# Patient Record
Sex: Male | Born: 1935
Health system: Southern US, Community
[De-identification: ages and names within clinical notes are randomized; demographics above are authoritative.]

## PROBLEM LIST (undated history)

## (undated) DIAGNOSIS — N183 Chronic kidney disease, stage 3 (moderate): Secondary | ICD-10-CM

## (undated) DIAGNOSIS — G454 Transient global amnesia: Secondary | ICD-10-CM

## (undated) DIAGNOSIS — E1129 Type 2 diabetes mellitus with other diabetic kidney complication: Secondary | ICD-10-CM

## (undated) DIAGNOSIS — I10 Essential (primary) hypertension: Secondary | ICD-10-CM

## (undated) DIAGNOSIS — I2510 Atherosclerotic heart disease of native coronary artery without angina pectoris: Secondary | ICD-10-CM

## (undated) DIAGNOSIS — Z87442 Personal history of urinary calculi: Secondary | ICD-10-CM

## (undated) DIAGNOSIS — E7800 Pure hypercholesterolemia, unspecified: Secondary | ICD-10-CM

## (undated) DIAGNOSIS — K219 Gastro-esophageal reflux disease without esophagitis: Secondary | ICD-10-CM

## (undated) DIAGNOSIS — I480 Paroxysmal atrial fibrillation: Secondary | ICD-10-CM

## (undated) DIAGNOSIS — E785 Hyperlipidemia, unspecified: Secondary | ICD-10-CM

## (undated) HISTORY — PX: JOINT REPLACEMENT: SHX530

## (undated) HISTORY — PX: TOTAL HIP ARTHROPLASTY: SHX124

## (undated) HISTORY — PX: TONSILLECTOMY: SUR1361

---

## 2000-07-24 ENCOUNTER — Encounter: Admission: RE | Admit: 2000-07-24 | Discharge: 2000-07-24 | Payer: Self-pay | Admitting: Orthopedic Surgery

## 2000-07-24 ENCOUNTER — Encounter: Payer: Self-pay | Admitting: Orthopedic Surgery

## 2000-08-28 ENCOUNTER — Encounter: Payer: Self-pay | Admitting: Orthopedic Surgery

## 2000-09-04 ENCOUNTER — Encounter: Payer: Self-pay | Admitting: Orthopedic Surgery

## 2000-09-04 ENCOUNTER — Inpatient Hospital Stay (HOSPITAL_COMMUNITY): Admission: RE | Admit: 2000-09-04 | Discharge: 2000-09-09 | Payer: Self-pay | Admitting: Orthopedic Surgery

## 2002-02-13 ENCOUNTER — Encounter: Payer: Self-pay | Admitting: Orthopedic Surgery

## 2002-02-20 ENCOUNTER — Ambulatory Visit (HOSPITAL_COMMUNITY): Admission: RE | Admit: 2002-02-20 | Discharge: 2002-02-20 | Payer: Self-pay | Admitting: Orthopedic Surgery

## 2006-07-02 DIAGNOSIS — G454 Transient global amnesia: Secondary | ICD-10-CM

## 2006-07-02 HISTORY — DX: Transient global amnesia: G45.4

## 2007-03-19 ENCOUNTER — Ambulatory Visit: Payer: Self-pay | Admitting: Internal Medicine

## 2007-03-19 ENCOUNTER — Inpatient Hospital Stay (HOSPITAL_COMMUNITY): Admission: EM | Admit: 2007-03-19 | Discharge: 2007-03-22 | Payer: Self-pay | Admitting: Emergency Medicine

## 2007-03-20 ENCOUNTER — Encounter (INDEPENDENT_AMBULATORY_CARE_PROVIDER_SITE_OTHER): Payer: Self-pay | Admitting: Neurology

## 2007-04-04 ENCOUNTER — Ambulatory Visit: Payer: Self-pay | Admitting: Internal Medicine

## 2007-04-14 ENCOUNTER — Encounter: Payer: Self-pay | Admitting: Internal Medicine

## 2007-04-14 ENCOUNTER — Ambulatory Visit: Payer: Self-pay

## 2007-04-24 ENCOUNTER — Encounter (HOSPITAL_COMMUNITY): Admission: RE | Admit: 2007-04-24 | Discharge: 2007-07-02 | Payer: Self-pay | Admitting: Internal Medicine

## 2007-06-03 ENCOUNTER — Ambulatory Visit: Payer: Self-pay | Admitting: Internal Medicine

## 2007-07-03 ENCOUNTER — Encounter (HOSPITAL_COMMUNITY): Admission: RE | Admit: 2007-07-03 | Discharge: 2007-10-01 | Payer: Self-pay | Admitting: Internal Medicine

## 2007-09-03 ENCOUNTER — Ambulatory Visit: Payer: Self-pay

## 2007-09-03 ENCOUNTER — Encounter: Payer: Self-pay | Admitting: Internal Medicine

## 2007-10-02 ENCOUNTER — Ambulatory Visit: Payer: Self-pay | Admitting: Internal Medicine

## 2008-01-29 ENCOUNTER — Encounter: Admission: RE | Admit: 2008-01-29 | Discharge: 2008-01-29 | Payer: Self-pay | Admitting: Orthopedic Surgery

## 2008-12-02 ENCOUNTER — Ambulatory Visit: Payer: Self-pay | Admitting: Internal Medicine

## 2008-12-02 DIAGNOSIS — I2510 Atherosclerotic heart disease of native coronary artery without angina pectoris: Secondary | ICD-10-CM | POA: Insufficient documentation

## 2008-12-02 DIAGNOSIS — E785 Hyperlipidemia, unspecified: Secondary | ICD-10-CM | POA: Insufficient documentation

## 2008-12-02 HISTORY — DX: Hyperlipidemia, unspecified: E78.5

## 2008-12-11 ENCOUNTER — Encounter: Payer: Self-pay | Admitting: Internal Medicine

## 2009-01-17 ENCOUNTER — Ambulatory Visit (HOSPITAL_COMMUNITY): Admission: RE | Admit: 2009-01-17 | Discharge: 2009-01-17 | Payer: Self-pay | Admitting: Orthopedic Surgery

## 2010-11-14 NOTE — Assessment & Plan Note (Signed)
[place] OFFICE NOTE   GOTHAM, RADEN                   MRN:          [id number]  DATE:06/03/2007                            DOB:          10/03/1924    PRIMARY CARE PHYSICIAN:  Dr. Wendie Agreste.   INTERVAL HISTORY:  Victor Wallace is a delightful 77 year old male with a  history of diabetes, hypertension, hyperlipidemia.  He was admitted  earlier in the year with an episode of global amnesia and subsequently  ruled in for a non-ST-elevation myocardial infarction.  He was taken to  the cath lab and had moderate coronary artery disease without any clear  culprit lesion.  LV function was moderately decreased with an EF of 35%  and there was a lateral wall defect.  There was the question of  vasospasm or perhaps thromboembolism.   Since that time, he has been doing well.  He has been going to cardiac  rehab and feeling great.  He denies any chest pain or shortness of  breath.  He has been having problems with hypotension with blood  pressures in the 70s and 80s post exercise.  He has stopped his Norvasc.  He also had a cough with lisinopril and stopped that as well.  He feels  much better.  Followup echocardiogram shows an EF of 55-60% with no  significant wall motion abnormalities and no valvular disease.   CURRENT MEDICATIONS:  1. Actos 30 mg a day.  2. Glipizide ER 12.5 mg a day.  3. Metformin 500 t.i.d.  4. Januvia 100 a day.  5. Imdur 30 a day.  6. Timolol.  7. Multivitamin.  8. Prilosec.  9. Lipitor 20 a day.  10.Toprol 25 a day.  11.Aspirin 81 a day.   PHYSICAL EXAM:  He is well-appearing.  In no acute distress.  Ambulates  around the clinic without any respiratory difficulty.  Blood pressure is 104/50, heart rate 65, weight 183.  HEENT:  Normal.  NECK:  Supple.  There is no JVD.  Carotids are 2+ bilaterally with  bilateral soft bruits.  There is no lymphadenopathy or thyromegaly.  CARDIAC:   PMI is not displaced.  He had a regular rate and rhythm with  soft S4.  Very soft murmur at the right sternal border radiating through  the carotids.  S2 was well preserved.  LUNGS:  Clear.  ABDOMEN:  Soft, nontender, nondistended.  There is no  hepatosplenomegaly.  No bruits.  No masses.  Good bowel sounds.  EXTREMITIES:  Warm with no cyanosis, clubbing, or edema.  No rash.  NEURO:  He is alert and oriented x3.  Cranial nerves 2-12 are intact.  Moves all 4 extremities without difficulty.   ASSESSMENT AND PLAN:  1. Nonobstructive coronary artery disease with previous non-ST-      elevation myocardial infarction.  His event is really more like      Tako-Tsubo cardiomyopathy.  However, the wall motion abnormality is      not quite consistent with this.  Given his concurrent amnesia, I      think perhaps the best explanation  was thromboembolic disease.      Alternately, could also be vasospastic.  In any event, he is doing      much better.  His left ventricular function has completely      recovered.  He would like to continue his ACE inhibitor and      possibly his Norvasc if possible, though his blood pressure just      will not tolerate this.  Will continue current therapy.  Will get a      followup echocardiogram in 3 months to make sure things are stable.  2. Hyperlipidemia.  Goal LDL is less than 70.  This is followed by Dr.      Wendie Agreste.  3. Hypotension.  This has resolved.  We will continue to hold his      lisinopril and Norvasc.   DISPOSITION:  Will see him back in clinic in 3 to 4 months for routine  followup.     Victor Pascal. Bensimhon, MD  Electronically Signed    DRB/MedQ  DD: 06/03/2007  DT: 06/03/2007  Job #: [id number]   cc:   Cecille Amsterdam

## 2010-11-14 NOTE — Cardiovascular Report (Signed)
NAME:  PRECILIANO, CASTELL NO.:  [id number]   MEDICAL RECORD NO.:  [id number]          PATIENT TYPE:  INP   LOCATION:  3709                         FACILITY:  [facility]   PHYSICIAN:  Shaune Pascal. Bensimhon, MDDATE OF BIRTH:  11-Apr-1925   DATE OF PROCEDURE:  DATE OF DISCHARGE:                            CARDIAC CATHETERIZATION   and then some detail about cardiology catheterization report on Mr.  Victor Wallace.   [name] is a very pleasant 77 year old male with no history of  coronary artery disease.  He does have a history of diabetes,  hypertension, hyperlipidemia.  He presented with chest pain and global  amnesia.  He was working on his computer and trying to work out of virus  and developed pain in his chest and down his left arm.  However, he then  developed acute global amnesia.  He was brought to the emergency room.  EKG was normal.  He subsequently ruled in for non-ST elevation  myocardial infarction with a troponin in the 3 range.  Echocardiogram  showed mildly decreased LV function with regional wall motion  abnormalities.  Workup of his amnesia has not revealed any clear source.  Fortunately, it has resolved.  He was brought today for diagnostic  angiography.   PROCEDURES PERFORMED:  1. Selective coronary angiography.  2. Left heart cath.  3. Left ventriculogram.  4. AngioSeal femoral artery closure.   DESCRIPTION OF PROCEDURE:  The risks and indication of procedure were  explained.  Consent was signed and placed on the chart.  A 6-French  arterial sheath was placed in the right femoral artery using a modified  Seldinger technique.  Standard catheters including JL-4, JR-4 and angled  pigtail were used for procedure.  All catheter exchanges made over wire.  There are no apparent complications.  Central aortic pressure was 135/75  with a mean of 100.  LV pressure was 152/18 with an EDP of 29.  There  was no aortic stenosis.  Left main was normal.   LAD  was a long vessel coursing to the apex.  It gave off a single large  diagonal in the mid section. There was a 30% tubular lesion in the  proximal LAD.  In the mid, LAD there was a 40% lesion after a very small  aneurysmal segment.  The distal LAD had a 30% tubular lesion.   Left circumflex gave off a large branching high OM-1, small OM-2. In the  proximal portion of the OM-1, there is a 30-40% lesion.  The very distal  OM tapered off rapidly and had moderate diffuse disease.  There appear  to be reduced blush in the myocardium at this segment.   Right coronary artery was a dominant vessel.  It gave off a large acute  marginal which supplied the PDA territory.  There was a moderate-sized  posterolateral and a small posterolateral.   Left ventriculogram done in the RAO position showed ejection fraction  about 35%.  The base of the ventricle appeared to work well as well as  the apex.  There was a band-like region of akinesis throughout the mid  ventricle.   ASSESSMENT:  1. Moderate nonobstructive coronary artery disease.  2. Non-ST-elevation myocardial infarction without clear culprit      lesion.  3. Moderate LV dysfunction with elevated filling pressures.   </PLAN/DISCUSSION>  By ventriculography, he has had a significant infarct, but does not have  a coronary lesion to explain it.  It appears to be in the territory of  the large OM-1 and I question whether he had vasospasm or an embolic  phenomenon to this vessel.  Given recurrent chest pain yesterday, I  favor vasospasm.   He will need aspirin, nitroglycerin and calcium channel blockers.  Also  given his LV dysfunction, he will need a gentle beta blocker, ACE  inhibitor and gentle diuresis.      Shaune Pascal. Bensimhon, MD  Electronically Signed     DRB/MEDQ  D:  03/21/2007  T:  03/23/2007  Job:  [id number]

## 2010-11-14 NOTE — Assessment & Plan Note (Signed)
Victor Wallace                                 ON-CALL NOTE   Victor Wallace, Victor Wallace                   MRN:          [id number]  DATE:07/09/2007                            DOB:          07/12/1924    PRIMARY CARDIOLOGIST:  Dr. Glori Bickers.   I received a call from Verdis Frederickson at the [facility] cardiac rehab today, in  reference to Victor Wallace.  Victor Wallace is a patient of Dr.  Bernita Buffy, who has a history of non-ST elevation MI in September  2008, with catheterization revealing nonobstructive disease at that  time.  There is some question towards vasospasm.  He has been managed on  beta blocker and nitrate therapy.  His ACE inhibitor and Norvasc were  discontinued previously, secondary to hypotension.  The nurse in cardiac  rehab noted that Victor Wallace blood pressure has been running in the  70s today and have previously run between the 70s and 90s, and she was  asking if we would like to change any of the medications.  I spoke with  Victor Wallace, and we have decreased the Toprol XL to 12.5 mg daily.  He  is instructed to cut his tablets in half and take half a tablet daily.  He will continue on his Imdur at his current dose, given history of MI,  with question of a spasm.  He has follow-up with Dr. Haroldine Laws in  approximately 2 months.  He is to call back, if he has any additional  issues.     Murray Hodgkins, ANP  Electronically Signed    CB/MedQ  DD: 07/09/2007  DT: 07/09/2007  Job #: [id number]

## 2010-11-14 NOTE — Discharge Summary (Signed)
NAMEHOWIE, Victor Wallace NO.:  [id number]   MEDICAL RECORD NO.:  [id number]          PATIENT TYPE:  INP   LOCATION:  3709                         FACILITY:  [facility]   PHYSICIAN:  Alyson Locket. Love, M.D.    DATE OF BIRTH:  03-03-1925   DATE OF ADMISSION:  03/19/2007  DATE OF DISCHARGE:                               DISCHARGE SUMMARY   This 77 year old right handed white male was admitted on 09/17 2008 and  discharged March 24, 2007 for evaluation of left-sided numbness and  transient global amnesia.   HISTORY OF PRESENT ILLNESS:  Victor Wallace was seen in the [facility]  emergency room with the onset of left-sided chest pain associated with  numbness and tingling in his left arm.  He also had an episode of loss  of memory.  He had received aspirin and received sublingual  nitroglycerin.  He had evidence of transient global amnesia in the  emergency room.   PAST MEDICAL HISTORY:  Was significant for new onset amnesia, chest  pain, new onset diabetes mellitus, followed by Dr. Dwyane Dee,  hyperlipidemia, hypertension, status post right total hip replacement,  tonsillectomy, and right elbow fracture with previous surgery.   MEDICATIONS:  1,  Albuterol inhaler 2 puffs q.i.d.  1. Actos 45 mg daily  2. Lipitor 10 mg daily  3. Aspirin 81 mg daily  4. Glucophage 500 mg three times per day,  5. Januvia 100 mg daily  6. Glipizide 2.5 mg b.i.d.   SOCIAL HISTORY:  Revealed he is married, lives in [place], has  one son who is living and well.   FAMILY HISTORY:  Father died from coronary artery disease.  Mother died  of bowel obstruction and sepsis.   PHYSICAL EXAMINATION:  His examination at time of admission revealed  blood pressure 133/63, heart rate 79, respiratory rate was 20, he was  afebrile.  He is a well-developed elderly white male alert, cooperative  at the time of examination.  His mental status revealed that he repeated  questions over and over.  His  speech was well enunciated.  He was not  aphasic.  His general exam neurologic examination unremarkable,  vibration sense  unremarkable.  He had good finger-nose.  He had good  heel-to-shin.  Deep tendon reflexes were 1+.  There were no drift in the  upper and lower extremities.   LABORATORY DATA:  Revealed  an I-stat with pH 7.512, pCO2 29.6, bicarb  23.7.  His initial cardiac markers revealed CK-MB 3.59, troponin of  0.026 which was high.  His creatinine was 0.9.  White blood cell count  11,300, hemoglobin 12.4, hematocrit 36.9, platelet count 186 K.  Repeat  cardiac panel revealed  a CPK 147, MB 23.8, troponin 3.28 again  elevated.  His CBC revealed white blood cell count 9700, hemoglobin  12.2, hematocrit 37.0, platelet count 173 K.  His lipid profile revealed  triglycerides of 64, HDL 45, LDL of 73.  He had heparin levels in the  hospital which were therapeutic.  Repeat BMET 03/20/07 revealed sodium  141, potassium 3.4, chloride 109, CO2 content 28, glucose  105, BUN 15,  creatinine 0.87.  His repeat cardiac panel 09/18 revealed a CK 135,  again, troponin 2.40, CK-MB 17.89.  Another repeat revealed a CK 118, CK-  MB 12.6, relative index 10.7, troponin 1.64.  TSH was 3.735 in the  normal range.  Vitamin B12 level was 521.  His repeat CBC 09/19 revealed  white blood cell count of 7800, hemoglobin 11.1, platelet count 140.  His repeat BMET 9/19 revealed sodium 144, potassium 3.7, chloride 110,  CO2 content 29, glucose 117, BUN 11, creatinine 0.99, calcium 8.9.  last  CBC 03/22/07 revealed white blood cell count 6700, hemoglobin 11.4  hematocrit 34.1, platelet count 143K.  Last BMET 03/22/07 revealed  sodium 145, potassium 3.3.  He did receive a dose of potassium for this,  chloride 108, CO2 content 29, glucose 135, BUN 13, creatinine 0.89.  His  chest x-ray 09/17 revealed scattered right lung nodules.  New chest x-  ray is recommended.  CT scan of the head showed no intracranial  lesions,  basal ganglia old small infarcts, white matter changes.  Perivascular  space abnormality in the left lenticular nucleus.  MRI study of the  brain with and without contrast enhancement showed no acute infarction,  showed mild age related atrophy without hydrocephalus, an old small  infarct versus prominent perivascular spaces in the basal ganglia.  Intracranial MRA showed question 2.3 mm communicating artery aneurysm,  mild vessel atherosclerotic vascular type changes.  His EKG showed right  bundle branch block, normal sinus rhythm, left anterior fascicular  block, bifascicular block.  His 2-D echocardiogram showed overall left  ventricular function normal, left ventricle ejection fraction estimated  in range of 50-55%, findings suggestive of mild hypokinesis of the mid  anterior septal wall, regional myocardial contraction function was  otherwise normal.  Left ventricular wall free thickness was mildly  increased.  Doppler parameters were consistent with abnormal left  ventricular relaxation.  EEG showed evidence of mild diffuse slowing.  Bilateral carotid Doppler studies were performed showing no evidence of  significant ICA stenosis.  A  cardiac catheterization was performed on  03/21/07 which showed moderate nonobstructive coronary artery disease,  non-ST elevation MI without clear culprit lesion present.  Moderate left  ventricular dysfunction with increased filling pressures noted.  It is  felt that the patient had had a significant infarct by ventriculography  but did not have the coronary arteries to explain the infarct.   HOSPITAL COURSE:  The patient's transient global amnesia cleared.  There  was no explanation for the transient global amnesia. He did have  evidence of an myocardial infarction, was seen by [facility] cardiology,  Dr. Haroldine Laws.  It is recommended he follow up in 4-6 weeks with follow-  up echo.  It was noted that he did have some nodules on his chest  x-ray  and repeat chest x-ray is also recommended.  He will see Dr. Haroldine Laws  in approximately 3 weeks.  He had no focal neurologic symptoms or signs  in the hospital other than transient global amnesia which cleared.   Prior to discharge the night before he had received Lasix and his  potassium had dropped slightly. He was also noted to have slightly low  blood pressure the next morning.  Because of this lisinopril was  discontinued.   IMPRESSION:  1. Transient global amnesia code 437.7  2. Acute myocardial infarction, code 429.2 with nonobstructive      coronary artery disease  3. Diabetes mellitus code 250.60  4. Hyperlipidemia, code 272.4  5. Hypertension, code 796.2.  6. Status post total right hip replacement, code unknown, 719.45      suspected  7. History of tonsillectomy.  8. Status post right elbow fracture and surgery  9. Chronic obstructive pulmonary disease, code 496.   PLAN:  At this time is to discharge the patient with no driving for 3  weeks. return to Dr. Haroldine Laws and Dr. Jannifer Franklin in approximately 3-4  weeks.   DISCHARGE MEDICATIONS:  1. His beta blocker will be held today but tomorrow take Toprol XL 25      mg daily  2. Today he will take Ecotrin 325 mg daily  3. Glucotrol 2.5 mg q.12 h  4. Imdur 30 mg daily,  5. Januvia 100 mg daily  6. Lipitor 40 mg daily  7. Norvasc 2.5 mg daily  8. Ventolin 90 mcg 2 puffs q.i.d.   DISPOSITION:  He is discharged improved from his prehospital status, not  to drive a car for 3 weeks, not to have sexual activity for 3 weeks.           ______________________________  Alyson Locket. Erling Cruz, M.D.     JML/MEDQ  D:  03/22/2007  T:  03/23/2007  Job:  [id number]   cc:   Elayne Snare, M.D.  Shaune Pascal. Bensimhon, MD  Cecille Amsterdam

## 2010-11-14 NOTE — H&P (Signed)
NAME:  Victor, Wallace NO.:  [id number]   MEDICAL RECORD NO.:  [id number]          PATIENT TYPE:  EMS   LOCATION:  [facility]JO                         FACILITY:  [facility]   PHYSICIAN:  Jill Alexanders, M.D.  DATE OF BIRTH:  Jun 02, 1925   DATE OF ADMISSION:  03/19/2007  DATE OF DISCHARGE:                              HISTORY & PHYSICAL   HISTORY OF PRESENT ILLNESS:  Victor Wallace is an 77 year old,  right-handed white male born 1925/02/26 with a history of high blood  pressure and diabetes.  The patient is followed by Dr. Wendie Agreste  and is  followed by Dr. Dwyane Dee for diabetes.  The patient presents to the [facility] Emergency Room today after noting onset of left-sided chest pain  that began around 1 p.m., associated with some numbness, tingling  sensations down the left arm.  This patient apparently went to his  primary care doctor's office, and he called 911 on the way over.  EMS  met them at the office and brought him to the [facility].  Apparently, he had gotten some aspirin earlier and got some sublingual  nitroglycerin.  All around this time, the patient began having  confusion.  By the time he hit the emergency room, he was having  problems with significant amnesia.  A CT scan of the brain was done that  was unremarkable.  Code stroke was called because of the altered mental  status.  The patient continues to complain of chest pressure and  tingling down the left arm.  EKG shows normal sinus rhythm, right bundle  branch block, left anterior fascicular block.  No obvious acute ischemia  was noted.  Neurology was called as a code stroke.  NIH Stroke Scale was  2.   PAST MEDICAL HISTORY:  1. New onset of amnesia, consistent with transient global amnesia.  2. Chest pain, new onset.  3. Diabetes, followed by Dr. Dwyane Dee.  4. Dyslipidemia.  5. Hypertension.  6. Status post right total hip replacement.  7. Tonsillectomy.  8. Right elbow fracture, and  surgery for this.   MEDICATIONS:  1. Albuterol inhaler 2 puffs q.i.d.  2. Actos 45 mg daily.  3. Lipitor 10 mg daily.  4. Aspirin 81 mg daily.  5. Glucophage 500 mg 3 times a day.  6. Januvia 100 mg daily.  7. Glipizide 2.5 mg b.i.d.   The patient does drink wine on a regular basis to a moderate degree.  Does not smoke cigarettes   SOCIAL HISTORY:  This patient is married.  Lives in the [place] area.  The patient has one son who is alive and well.  The patient is retired.   FAMILY MEDICAL HISTORY:  Notable for the fact that father died with  coronary artery disease.  Mother died with a bowel obstruction and  sepsis.  The patient has a brother who died of unknown cause.   REVIEW OF SYSTEMS:  Notable for no recent fevers or chills.  The patient  denies headache.  Denies shortness of breath.  Does note chest pressure,  left  arm tingling.  Denies abdominal pain, nausea, vomiting, troubles  controlling the bowels or bladder, and has not had blackout episodes or  seizures.  The patient does have a history of hypoglycemic episodes in  the past.   PHYSICAL EXAMINATION:  VITAL SIGNS:  Blood pressure is 133/63, heart  rate 79, respiratory rate 20, temperature afebrile.  GENERAL:  This patient is a well-developed elderly white male who is  alert, cooperative at the time of examination.  HEENT:  Head is atraumatic.  Eyes: Pupils equal, round, reactive to  light.  Disks are flat bilaterally.  NECK:  Supple.  No carotid bruits noted.  RESPIRATORY:  Clear.  CARDIOVASCULAR:  Reveals a are regular rate and rhythm.  No obvious  murmurs or rubs noted.  EXTREMITIES:  Without significant edema.  ABDOMEN:  Reveals positive bowel sounds.  No organomegaly or tenderness  noted.  NEUROLOGIC:  Cranial nerves as above.  Facial symmetry is present.  The  patient has good sensation in the face to pinprick and soft touch  bilaterally.  He has good strength in the facial muscle  and muscles of  head turning and shoulder shrug bilaterally.  Speech is well enunciated,  not aphasic.  The patient has again full extraocular movements.  Visual  fields are full.  Motor testing shows 5/5 strength in all fours.  Good  symmetric motor and tone is noted throughout.  Sensory testing is intact  to pinprick, soft touch, and vibratory sensation throughout.  The  patient has good finger-nose-finger and heel-to-shin.  Gait was not  tested.  The patient has good symmetry to reflexes.  Toes are neutral  bilaterally.  No drift is noted in the upper or lower extremities.   Laboratory values notable for white count of 11.3, hemoglobin of 12.4,  hematocrit of 36.9.  The patient has platelets of 186.  INR 0.9.  Sodium  138, potassium 4.1, chloride 106, glucose 150, BUN of 22.  CK-MB  fraction 3.5, troponin I slightly elevated at 0.26.   CT of the head is as above.   IMPRESSION:  1. New onset of transient global amnesia no clear evidence of a      stroke.  2. Chest pain of new onset.  3. Diabetes.  4. Hypertension.   This patient initially had complaints of what sounded like typical chest  pain, but this has evolved into what appears to be a transient global  amnesia episode.  The patient is unable to remember three words at two  minutes or five minutes, can recall at one minute.  The patient has and  normal sensorium.  Clinical syndrome is fully consistent with transient  global amnesia.   PLAN:  1. Admission to [facility].  2. MRI of the brain.  3. MRI angiogram of the intracranial vessels.  4. Carotid Doppler study.  5. EEG study.  6. Aspirin therapy.  7. Cardiology evaluation, as the patient still complains of chest      pressure and left arm tingling.  8. Will follow the patient's clinical course while in-house.      Jill Alexanders, M.D.  Electronically Signed     CKW/MEDQ  D:  03/19/2007  T:  03/20/2007  Job:  [id number]   cc:   Elayne Snare, M.D.  [facility]  [facility]

## 2010-11-14 NOTE — Consult Note (Signed)
NAME:  TANYA, MARVIN NO.:  [id number]   MEDICAL RECORD NO.:  [id number]          PATIENT TYPE:  INP   LOCATION:  3709                         FACILITY:  [facility]   PHYSICIAN:  Shaune Pascal. Bensimhon, MDDATE OF BIRTH:  [birth year]   DATE OF CONSULTATION:  03/19/2007  DATE OF DISCHARGE:                                 CONSULTATION   PRIMARY CARE PHYSICIAN:  Dr. Wendie Agreste.   REASON FOR CONSULTATION:  Chest pain concerning for unstable angina.   INTERVAL HISTORY:  [name] is a delightful, 77 year old male with a  history of diabetes, hypertension and hyperlipidemia. He does not have  any known history of coronary artery disease. He has never had a cardiac  catheterization. He has had several screening stress tests in the past  which his wife says were negative.   Apparently he was in his usual state of health today when he was working  on a computer trying to straighten out a virus when he developed acute  chest discomfort. There was no radiation, shortness of breath, nausea,  vomiting or diaphoresis. He is totally amnestic of this episode and I  get this history through his wife. Apparently she then drove him to the  primary care physician's office but they were closed for lunch so they  called 911 from the parking lot. An ambulance arrived and he told the  paramedics that he was also having some arm pain. They gave him a  nitroglycerin and apparently his symptoms got better. However, he  developed progressive amnesia and had essentially no shortterm or long-  term memory. In the emergency room, he was evaluated by neurology,  underwent a head CT scan which I hear is negative, I do not have the  formal report. He also underwent an MRI which his results are currently  pending. Apparently he did have some more chest pain in the emergency  room but now is pain free.   Now when I talk to him he seems like he is recovering. He is able to  recall distant events and knows  his name and the year. However, he was  unable to tell me what hospital he was in.   At baseline he is fairly sedentary. He denies any exertional chest pain  or shortness of breath.   REVIEW OF SYSTEMS:  He does have arthritis pain and GERD symptoms and he  also has had some fatigue for about a year now. The remainder of the  review of systems is negative except for HPI and problem list.   PAST MEDICAL HISTORY:  Diabetes x6 years, hypertension x8 years,  hyperlipidemia x3 years, osteoarthritis and history of pneumonia in the  spring of 2008.   CURRENT MEDICATIONS:  1. Januvia 100 a day.  2. Actos 45 a day.  3. Albuterol.  4. Aspirin 81.  5. Glucophage 500 mg 3 tablets a day.  6. Lipitor 10 a day.  7. Metamucil.  8. Prilosec.  9. Glipizide ER 2.5 mg a day.   ALLERGIES:  No known drug allergies.   SOCIAL HISTORY:  He lives in [place] with his  wife and  stepchild. 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 He has a history of tobacco but quit 40 years  ago, occasional alcohol.   FAMILY HISTORY:  Mother died at 58 due to sepsis in the setting of small-  bowel obstruction. Father died at age 79 due to a myocardial infarction.  He had one brother who died in his 11s of unknown reasons.   PHYSICAL EXAMINATION:  GENERAL:  He is sitting in bed in no acute  distress.  VITAL SIGNS:  Respirations are unlabored. Blood pressure is 120/63,  heart rate is 79. He is sating 100% on 2 liters.  HEENT:  Normal.  NECK:  Supple. There is no JVD. Carotids are 2+ bilaterally without any  obvious bruits. There is no lymphadenopathy or thyromegaly.  CARDIAC:  PMI is nondisplaced. He has very distant heart sounds. He is  regular with no obvious murmurs.  LUNGS:  Clear.  ABDOMEN:  Soft, nontender, nondistended. There is no hepatosplenomegaly,  no bruits, no masses. Good bowel sounds.  EXTREMITIES:  Warm with no cyanosis, clubbing or edema. Distal pulses   are 1+ bilaterally. There is no rash.  NEUROLOGIC:  Alert and oriented. He knows his name and what month it is  as well as the year. He is able to tell me that he is in [place] but  he does not know the name of hospital. His cranial nerves are grossly  intact. Moves all 4 extremities without difficulty. Affect is pleasant.   EKG shows sinus rhythm at a rate of 81 with a bifascicular block with a  right bundle branch block and a left anterior fascicular block. There is  no significant ST-T wave abnormalities.   LABORATORY DATA:  White count 11.3, hemoglobin 12.4. Sodium 138,  potassium 4.1, BUN of 22, creatinine of 0.9. Point of care markers shows  an MB of 3.5 with a troponin of 0.26. Chest x-ray shows scattered lung  nodules but no acute process.   ASSESSMENT:  1. Chest pain radiating to the left arm concerning for unstable      angina.  2. Mildly elevated troponin on point of care markers.  3. Acute amnesia.  4. Diabetes.  5. Hypertension.  6. Hyperlipidemia.   PLAN/DISCUSSION:  His symptoms and his troponin are quite concerning to  me for unstable angina, but I am also quite concerned over his amnesia  as well which now appears to be improving. I have seen mildly elevated  troponins in the setting of acute neurologic process in the past but  this certainly does not explain his chest pain. Ideally I would plan for  a cardiac catheterization but I would be more reluctant about this in  the setting of acute stroke. For  now I will plan admission and watch him on telemetry, rule out  myocardial infarction, and check a 2-D echocardiogram. I will start  heparin if it is okay with neurology and will discuss with them cardiac  catheterization versus stress testing.      Shaune Pascal. Bensimhon, MD  Electronically Signed     DRB/MEDQ  D:  03/19/2007  T:  03/19/2007  Job:  [id number]

## 2010-11-14 NOTE — Assessment & Plan Note (Signed)
[facility]                            CARDIOLOGY OFFICE NOTE   Victor Wallace, Victor Wallace                   MRN:          [id number]  DATE:10/02/2007                            DOB:          03/15/1925    INTERVAL HISTORY:  Victor Wallace is a delightful, 77 year old male with  history diabetes, hypertension and hyperlipidemia.  He was admitted at  the end of last year with a non-ST-elevation myocardial infarction in  the setting of an episode of global amnesia.  This was during a very  stressful time.  EF was initially 35%.  Cardiac catheterization showed  moderate coronary artery disease without any clear culprit lesion.  There was a question of stress-induced cardiomyopathy versus vasospasm  or thromboembolism.  Since that time, his ejection fraction has  normalized.  Echocardiogram 2 weeks ago showed normal cardiac function.   Overall, he feels fatigued, but otherwise no complaints.  No chest pain  or shortness of breath.   CURRENT MEDICATIONS:  1. Actos 30 a day.  2. Glipizide.  3. Metformin.  4. Januvia.  5. Imdur 30 a day.  6. Timolol.  7. Metamucil.  8. Multivitamin.  9. Toprol 12.5 a day.  10.Lipitor 20 a day.  11.Aspirin 81 a day.   PHYSICAL EXAMINATION:  GENERAL:  He is well-appearing in no acute  distress.  He ambulates around the clinic without any respiratory  difficulty.  VITAL SIGNS:  Blood pressure is 100/64, heart rate 54.  Weight 189.  HEENT:  Normal.  NECK:  Supple.  No JVD.  Carotids are 2+ bilaterally with no obvious  bruits appreciated.  There is no lymphadenopathy or thyromegaly.  CARDIAC:  PMI is nondisplaced.  Bradycardic and regular with soft  systolic ejection murmur at the right sternal border and an S4.  S2 is  well-preserved.  LUNGS:  Clear.  ABDOMEN:  Soft, nontender, nondistended.  There is no  hepatosplenomegaly, no bruits, no masses.  Good bowel sounds.  EXTREMITIES:  Warm with no cyanosis, clubbing or  edema.  No rash.  NEUROLOGIC:  Alert and oriented x3.  Cranial nerves 2-12 are intact.  Moves all four extremities without difficulty.   EKG shows sinus bradycardia with a right bundle branch block at a rate  of 54.   ASSESSMENT/PLAN:  1. Coronary artery disease.  This is stable.  2. History of left ventricular dysfunction.  This has resolved.  I      suspect he may have had a stress-induced cardiomyopathy.  Blood      pressure is too low to add an ACE inhibitor and, in fact, given his      fatigue, I would even discontinue his beta-blocker.  3. Hyperlipidemia and diabetes followed by Dr. Osborne Casco.  4. Fatigue.  As above, we will discontinue his metoprolol to see if      this helps.  I encouraged him to continue his activity.   DISPOSITION:  I will see him back in a 1 year's time.  I told him to  contact me if there were any problems.     Shaune Pascal. Bensimhon, MD  Electronically Signed    DRB/MedQ  DD: 10/02/2007  DT: 10/02/2007  Job #: [id number]   cc:   Haywood Pao, M.D.

## 2010-11-14 NOTE — Procedures (Signed)
EEG NUMBER:  [id number].   REFERRING PHYSICIAN:  Jill Alexanders, M.D.   CLINICAL HISTORY:  An 77 year old patient with an episode of amnesia and  possible TIA.   This is a routine 17-channel EEG recorded with the patient awake and  alert using standard 10/20 electrode placement.   The background awake rhythm consists of 8 to 9 hertz alpha which is of  moderate amplitude, synchronous, reactive to eye opening and closure. No  paroxysmal epileptiform activity, spikes or sharp waves are seen.   Length of the procedure:  20.7 minutes. Technical component is average.  EKG tracing reveals regular sinus rhythm. Photic stimulation is  unremarkable. Hyperventilation was not performed.   IMPRESSION:  Procedure performed during awake state is within normal  limits. No definite epileptiform activities seen.           ______________________________  Kathie Rhodes. Leonie Man, MD     WKG:SUPJ  D:  03/20/2007 17:41:01  T:  03/21/2007 08:20:48  Job #:  [id number]

## 2010-11-14 NOTE — Assessment & Plan Note (Signed)
[facility] OFFICE NOTE   MASAI, KIDD                   MRN:          [id number]  DATE:04/04/2007                            DOB:          [birth year]    PRIMARY CARE PHYSICIAN:  Dr. Wendie Agreste.   PATIENT IDENTIFICATION:  Victor Wallace is a delightful 77 year old male  with a history of diabetes, hypertension, and hyperlipidemia.  He was  recently admitted to the hospital with an episode and global amnesia.  Subsequently ruled in for a non-ST-elevation myocardial infarction.  He  was taken to the catheterization lab and had moderate coronary artery  disease without any clear culprit lesion.  Left ventricular function was  about 35%.  There was a question of vasospasm or perhaps  thromboembolism.  He has been discharged home.   Since discharge, he is doing quite well.  No chest pain or shortness of  breath.  He is getting back to his regular activity.   CURRENT MEDICATIONS:  1. Metoprolol 12.5 mg a day.  2. Actos 30.  3. Lipitor 10.  4. Glipizide 12.5 a day.  5. Metformin 500 t.i.d.  6. Januvia 100 mg a day.  7. Amlodipine 2.5 a day.  8. Imdur 30 a day.  9. Aspirin 325.  10.Timolol eye drops.  11.Multivitamin.  12.Garlic.   PHYSICAL EXAM:  He is an elderly male in no acute distress.  Ambulates  around the clinic without any respiratory difficulty.  Blood pressure is 108/62, heart rate is 74, weight 185.  HEENT:  Normal.  NECK:  Supple.  No JVD.  Carotids are 2+ bilaterally without any bruits.  There is no lymphadenopathy or thyromegaly.  CARDIAC:  His PMI is not displaced.  He is regular rate and rhythm with  an S4.  No murmur.  LUNGS:  Clear.  ABDOMEN:  Soft, nontender, nondistended.  There is no  hepatosplenomegaly.  No bruits.  No masses.  Good bowel sounds.  EXTREMITIES:  Warm with no cyanosis, clubbing, or edema.  No rash.  NEURO:  Alert and oriented x3.  Cranial nerves 2-12 are intact.   Moves  all 4 extremities without difficulty.  Affect is pleasant.   EKG shows normal sinus rhythm at a rate of 74 with a right bundle branch  block and a left posterior fascicular block, consistent with a  bifascicular block.  There are biphasic T wave changes in the high  lateral wall.   ASSESSMENT AND PLAN:  1. Coronary artery disease status post recent non-ST-elevation      myocardial infarction.  Given his EKG, I suspect his event was in      the lateral wall.  He does have fairly significant left ventricular      dysfunction, which I am hoping will recover.  We have been treating      him for vasospasm.  At this point, would have him go to cardiac      rehab.  We will continue his Toprol and add lisinopril 5 mg a day      as his blood pressure tolerates.  We will  get a 2D echocardiogram      to follow up on his left ventricular dysfunction.  2. Hyperlipidemia.  Most recent LDL was about 75.  We will increase      his Lipitor to 20 to get his LDL under 70.  3. Amnesia.  This has resolved.  He is followed by neurology.   DISPOSITION:  Return to clinic in 1 to 2 months.     Shaune Pascal. Bensimhon, MD  Electronically Signed    DRB/MedQ  DD: 04/04/2007  DT: 04/04/2007  Job #: [id number]   cc:   Cecille Amsterdam

## 2010-11-17 NOTE — Op Note (Signed)
   Victor Wallace, Victor Wallace                     ACCOUNT NO.:  [id number]   MEDICAL RECORD NO.:  [id number]                   PATIENT TYPE:  AMB   LOCATION:  DAY                                  FACILITY:  [facility]   PHYSICIAN:  Dione Plover. Aluisio, M.D.              DATE OF BIRTH:  April 08, 1925   DATE OF PROCEDURE:  02/20/2002  DATE OF DISCHARGE:                                 OPERATIVE REPORT   PREOPERATIVE DIAGNOSES:  Painful hardware, right hip.   POSTOPERATIVE DIAGNOSES:  Painful hardware, right hip.   PROCEDURE:  Right hip hardware removal.   SURGEON:  Dione Plover. Aluisio, M.D.   ASSISTANT:  Alexzandrew L. Dara Lords, P.A.   ANESTHESIA:  General.   ESTIMATED BLOOD LOSS:  Minimal.   DRAINS:  None.   COMPLICATIONS:  None.   CONDITION:  Stable to recovery.   BRIEF CLINICAL NOTE:  [name] is a 77 year old male who had a complex  right hip revision done approximately a year and a half ago. He has had some  pain related to a bulk in the trochanter. He presents now for hardware  removed.   DESCRIPTION OF PROCEDURE:  After successful administration of general  anesthetic, the patient was placed in the left lateral decubitus position,  right side up, held with a hip positioner. The right lower extremity was  prepped and draped in the usual sterile fashion. I utilized about 3 cm of  his old incision over the palpable screw head. The skin was cut a 10 blade  to subcutaneous tissue. The fascia lata was incised in line with the skin  incision. The screw was identified and was removed. The screw was actually  partially broken. The clamp over the trochanter was also removed. Any  abnormal metallic stained tissue. The wound was copiously irrigated with  antibiotic solution, fascia lata closed with interrupted #1 Vicryl, subcu  closed with interrupted 2-0 Vicryl, skin with staples. 20 cc of 0.25%  Marcaine with epinephrine injected into the subcutaneous tissues and deep  tissues. A bulky  sterile dressing was then applied, the patient awakened and  transported to the recovery room in stable condition.                                                Dione Plover Aluisio, M.D.    FVA/MEDQ  D:  02/20/2002  T:  02/23/2002  Job:  [id number]

## 2010-11-17 NOTE — Discharge Summary (Signed)
[facility]  Patient:    Victor Wallace, Victor Wallace                   MRN: [id number] Adm. Date:  [id number] Disc. Date: [id number] Attending:  Gearlean Alf Dictator:   Peter Congo, P.A.-C.                           Discharge Summary  ADMITTING DIAGNOSES: 1. Loose right femoral prosthesis. 2. Newly diagnosed diabetes. 3. Osteoarthritis.  DISCHARGE DIAGNOSES: 1. Loose right femoral prosthesis. 2. Newly diagnosed diabetes. 3. Osteoarthritis. 4. Postoperative hemorrhagic anemia - stable.  OPERATIONS:  On September 04, 2000, the patient underwent a right femoral revision with extended trochanteric osteotomy.  The surgeon was Dr. Pilar Plate Aluisio. Assistant was Dr. Latanya Maudlin.  Anesthesia was general.  Complications none.  CONSULTS:  None.  BRIEF HISTORY:  [name] is a 77 year old male, who had a right hip replacement for a fracture approximately 3-4 years ago in [place].  He has had persistent femoral pain from the onset.  This pain is activity related and has not subsided.  Plain x-rays revealed a large pedestal at the tip of the prosthesis, consistent with micro ______ and loosening.  A bone scan revealed marked increased activity around the tip of the stem.  He then presented to the hospital on September 04, 2000, for right femoral and revision secondary to intractable thigh pain.  HOSPITAL COURSE:  The patient was admitted on September 04, 2000, and had a right femoral revision with extended trochanteric osteotomy.  He tolerated surgery well without complications.  Postoperatively the patient utilized a PCA morphine for pain control.  By postop day #2, he was utilizing p.o. Percocet for pain.  His pain was well controlled on this regimen.  While in the operating room, the wound was dressed in a sterile fashion.  On postop day #1, he had no drainage through the dressings.  On postop day #2, his dressings were changed, and his wound was found to be free of  erythema or drainage.  His dressing was changed daily and found to have no erythema or drainage throughout the rest of his hospital stay.  A Hemovac drain was placed intraoperatively.  This was discontinued on postop day #1 without difficulty. Postoperatively the patient resumed his home dose of Glucotrol XL and also utilized an insulin sensitive sliding scale.  CBGs were checked with Novolin q.h.s. daily.  The Foley catheter was placed intraoperatively.  This was discontinued on postop day #2.  The patient voided well throughout the remainder of his hospital stay.  Postoperatively the patient was placed on Coumadin.  His INR and dosages of Coumadin were monitored by the pharmacy for DVT prophylaxis.  The patient was placed on three doses of Ancef q.8h. postoperatively for infection control.  The patient was placed on a touchdown weightbearing status to the right lower extremity.  He worked with physical therapy throughout his hospital stay.  He was somewhat slow to progress with physical therapy initially.  However, by September 08, 2000, the patient was ambulating well with minimal assist.  He was ambulating up to 35 feet prior to discharge with minimal assistance.  The patient utilized SCDs for 72 hours then knee highs postoperatively for DVT prophylaxis as well.  Hemoglobin and hematocrit were checked daily for three days.  On September 06, 2000, his hemoglobin was 8.1.  He was feeling very weak.  Therefore, he was transfused  two units of packed RBCs.  Hemoglobin did come back up to 9.4 on September 07, 2000 and on September 09, 2000, his hemoglobin was 9.8.  The patient did have some mild hyponatremia on postop day #1.  This was thought to be delutional.  Therefore, his IV fluids were KVO.  A nutritional assessment was obtained while in the hospital per the diabetes coordinator, as the patient had been recently newly diagnosed with diabetes mellitus.  The nutritionist was able to educate the patient on  glucose control through diet.  Occupational therapy worked with the patient throughout his hospital stay for ADLs and did not recommend any durable medical equipment.  However, they did work with him on sock aids and LH sponges.  On postop day #5, the patient was ambulating well with minimal assistance and felt to be medically stable for discharge.  LABORATORY VALUES:  Hemoglobin and hematocrit on September 05, 2000, were 9.1 and 27.1 respectively.  H&H on September 06, 2000, were 8.1 and 23.6 respectively.  H&H on September 09, 2000, were 9.8 and 28.8 respectively.  PT and INR on September 05, 2000 were 14.8 and 1.3.  On September 09, 2000, on Coumadin, SVT was 24.9, INR 3.3.  BMET on September 05, 2000, revealed a sodium of 133, glucose 191, calcium 7.9.  Otherwise within normal limits.  On September 06, 2000, sodium 133, glucose 166, calcium 8.2.  UA on August 27, 2000, revealed a specific gravity of 1.036, glucose greater than 1000, ketones 40, protein 30, otherwise normal. X-rays on September 04, 2000, one-view right hip x-ray, one-view pelvis revealed revision of right hip arthroplasty with apparent complication.  EKG on August 27, 2000, revealed possible early repolarization, right bundle branch block with left anterior fascicular block and was unconfirmed.  CONDITION ON DISCHARGE:  Improved.  DISCHARGE PLANS:  The patient was discharged home.  He was to have [place] for home health physical therapy and an R.N. to draw protimes and Coumadin therapy to be monitored through [place].  He was to resume his home dose of Glucotrol and monitor his blood glucose and follow up with his family medical doctor. Regular diet.  He could shower on postop day #5 as long as his wound was not draining.  He is to follow up with Dr. Wynelle Link in the office on September 16, 2000.  Dressing changes p.r.n.  DISCHARGE MEDICATIONS: 1. Percocet #40, 1-2 p.o. q.4-6h. p.r.n. pain with no refills. 2. Robaxin 500 mg 1 p.o. q.8h. p.r.n. spasm, #40  with no refills. 3. Trinsicon #40, 1 p.o. b.i.d. with no refills. 4. Coumadin per pharmacy.  He was to maintain his touchdown weightbearing status of the right lower  extremity and up as tolerated with walker. DD:  10/08/00 TD:  10/08/00 Job: [id number]

## 2010-11-17 NOTE — Op Note (Signed)
[facility]  Patient:    RAISTLIN, GUM                   MRN: [id number] Proc. Date: 09/04/00 Adm. Date:  [id number] Attending:  Gearlean Alf                           Operative Report  PREOPERATIVE DIAGNOSIS:  Painful femoral component, right total hip, with loosening.  POSTOPERATIVE DIAGNOSIS:  Painful femoral component, right total hip, with loosening.  PROCEDURE:  Right femoral revision with extended trochanteric osteotomy.  SURGEON:  Gaynelle Arabian, M.D.  ASSISTANTRobinette Haines.  ANESTHESIA:  General.  ESTIMATED BLOOD LOSS:  500.  DRAIN:  Hemovac x1.  COMPLICATIONS:  None.  CONDITION:  Stable to recovery room.  BRIEF CLINICAL NOTE:  Mr. Krotz is a 77 year old male who had a right hip replacement placed for a fracture approximately 3-4 years ago in [place]. He has had persistent femoral pain from the onset.  It is thigh pain, which is activity-related and has not subsided.  Plain x-rays showed a large pedestal at the tip of the prosthesis, consistent with micromotion and loosening.  A bone scan showed marked increased activity around the tip of the stem.  He now presents for right femoral revision secondary to intractable thigh pain.  PROCEDURE IN DETAIL:  After successful administration of general anesthetic, the patient was placed in the left lateral decubitus position with the right side up and held with the hip positioner.  The right lower extremity was isolated from the perineum with plastic drapes and prepped and draped in the usual sterile fashion.  A long posterolateral incision was then made.  The skin was cut with a #10 blade through the subcutaneous tissue to the level of the fascia lata which was incised in line with the skin incision. Cellsaverwas utilized.  Posterior scar tissue was excised off the femur, and the hip joint was identified.  The capsule was removed and the hip dislocated.  The femoral stem does  have a small amount of motion noted to torque, but there is no axial motion.  The proximal soft tissues were removed, and then a flexible osteotome was taken around the extent of the porous-coated proximally, and there is essentially no ingro anterior and posterior. Then along the trochanter, there is essentially no ingrowth anteriorly and posteriorly, but along the trochanter, there is a fair amount of spotty ingrowth. His trochanter was extremely thin, and for safety purposes, it was decided to perform an extended osteotomy at this point rather than running the risk of fracturing the trochanter. We did attempt to extract the stem with the extraction device, and there was some motion there, but it was not coming easily, thus we decided to do the osteotomy to prevent an uncontrolled fracture.  The fascia of the vastus lateralis was incised, and the muscle was elevated off of the posterior intermuscular septum.  The perforators were identified and coagulated.  The outline for the extended trochanteric osteotomy was made with a small drill, and then the oscillating saw was used to connect the drill holes, and then an osteotome was used to pry the extended trochanteric fragment off. The stem was identified, and there was one tiny area where there was ingrowth along the medial portion of the stem, and as soon as this small area was disrupted, the stem was easily removed.  The pedestal distally was then drilled through, and  the beaded-tipped guide rod was passed distally in the canal.  Distal reaming was performed up to 18.5 mm to allow for a 17 mm curved stem. The extended trochanteric fragment was reduced, and three Dahl-Miles cables are passed to secure it, and it is tightened down and anatomically reduced.  Another Dahl-Miles cable was passed around the femoral shaft just distal to the osteotomy to prevent a stress riser phenomenon from where the bone was cut.  The proximal reaming was  performed up to 17.5 mm with straight reamers.  The reaming for the sleeve was performed for an oversized 24-F, and then spout portion was machined up to a large. An oversized 24-F large sleeve for a 22 x 17 stem trial was then placed.  The trial 22 x 17 stem with a 36 + 21 + 8 neck was then placed and matched native anteversion.  The hip was then reduced with a 26+ and a 26 mm liner from his acetabular component.  There was no evidence of any eccentric wear on the liner, and the shell was found to be stable. Reduction was performed with excellent stability throughout.  The trial components were removed, and then the permanent 24-F large sleeve was impacted into the proximal femur with a good, tight fit.  This was an oversized sleeve for a 22 x 17 component.  A 22 x 17 long calcar stem with the 36 + 21 + 8 neck was then impacted, and the native anteversion curves it to a proximal anteversion of approximately 20 degrees.  This matches his native proximal anteversion.  The permanent 26 + 6 head is placed, and the hip is reduced once again with excellent stability.  The proximal portion of the greater trochanter was found to be very thin, and I was concerned that the proximal cable for the osteotomy fixation may serve as a stress riser to fracture off the trochanter; thus, we drilled through trochanter and placed a trochanteric bolt with the small metallic sleeve to effectively screw the trochanter into the stem.  This lead to a fantastic reduction and great stability throughout. The wound was then copiously irrigated with antibiotic solution.  We did not have enough blood loss to recover the Cellsaver.  The fascia over the vastus lateralis was closed with a running #1 Vicryl.  The soft tissues posteriorly to reattach the femur with #1 Ethibond.  The fascia lata was closed over a Hemovac drain with interrupted #1 Vicryl.  The subcu was closed with interrupted 0 and 2-0 Vicryl and the skin with  staples.  The incisions were clean and dry.  The drain was hooked to suction.  A bulky sterile dressing was applied.  He was placed into a knee immobilizer, awakened, and transported to  recovery in stable condition. DD:  09/04/00 TD:  09/05/00 Job: [id number]

## 2010-11-17 NOTE — H&P (Signed)
[facility]  Patient:    Victor Wallace, Victor Wallace                     MRN: [id number] Adm. Date:  09/06/00 Attending:  Gaynelle Arabian, M.D. Dictator:   Jenetta Loges, P.A.-C. CC:         Cecille Amsterdam, M.D., [place] [place] [address], [place], [place] [address]                         History and Physical  DATE OF BIRTH:  1925/01/23  CHIEF COMPLAINT:  Right hip pain.  HISTORY OF PRESENT ILLNESS:  This is a 77 year old male, who has had approximately a four-year history of pain into his right thigh after a hip replacement for a displaced femoral neck fracture.  He ended up having a total hip after the fracture in [place], [place].  It was a cementless femoral stem and acetabular component.  He has had pain continued since that time.  No groin pain.  His work-up included a lumbar CT that showed mild lumbar spondylosis.  He is subsequently sent for a bone scan.  This was felt to show loose right femoral prosthesis.  It was significant of uptake along the tip of the stem corresponding to the area, hypertropic changes on plain films.  He continues to have activity related pain, start-up pain.  No groin pain.  He is not having pain on range of motion.  With these findings, it is felt all consistent with the loose femoral prosthesis.  At this time femoral revision is indicated.  Risks and benefits were discussed with the patient at length including possible need for extended trochanteric osteotomy.  At this time he wishes to proceed.  PAST MEDICAL HISTORY: 1. Osteoarthritis. 2. Recently diagnosed diabetes on his preoperative physical.  PAST SURGICAL HISTORY: 1. Right total hip arthroplasty in 1998. 2. Reconstruction of elbow.  ALLERGIES:  No known drug allergies.  MEDICATIONS: 1. Ibuprofen which he discontinued. 2. Aspirin which was discontinued. 3. Multivitamin and vitamin C. 4. He also takes over-the-counter garlic and oyster cal.  He  is being placed on an unknown diabetic medicine by his medical physician preoperatively.  SOCIAL HISTORY:  He does not smoke.  He does drink occasionally one drink per day.  He lives at home with his wife in a one-story home.  He will have help postoperatively.  FAMILY HISTORY:  Father with coronary artery disease, died at age 3.  Mother with history of peritonitis, deceased at age 4.  REVIEW OF SYSTEMS:  The patient denies any recent fevers, chills, night sweats.  No bleeding tendencies.  CENTRAL NERVOUS SYSTEM:  No blurred, double vision, seizures, headaches, or paralysis.  RESPIRATORY:  No shortness of breath, productive cough, hemoptysis.  CARDIOVASCULAR:  No chest pain, angina, orthopnea.  GASTROINTESTINAL:  No nausea, vomiting, constipation, melena, or bloody stools.  GENITOURINARY:  Some mild frequency.  MUSCULOSKELETAL:  As pertinent in present illness.  PHYSICAL EXAMINATION:  GENERAL:  A well-developed, well-nourished, male, 77 years of age that looks younger than stated age.  VITAL SIGNS:  Pulse 80 and regular.  His respirations are 16.  Blood pressure is 140/95.  HEENT:  Normocephalic.  Extraocular motions intact.  NECK:  Supple.  No lymphadenopathy.  No carotid bruits.  CHEST:  Clear to auscultation bilaterally.  No rales or rhonchi.  HEART:  Regular rate and rhythm.  No murmurs, gallops, rubs, heaves, or thrills.  ABDOMEN:  Positive bowel sounds, soft, nontender.  EXTREMITIES:  Distal pulses +1 bilaterally.  No pitting edema.  Right hip as in history of present illness.  IMPRESSION: 1. Loose right femoral prosthesis. 2. Newly diagnosed diabetes. 3. Osteoarthritis.  PLAN:  The patient will be admitted for right hip revision.  He will be placed in sliding-scale insulin.  He is also being placed on oral hyperglycemic preoperatively but will monitor his sugars. DD:  09/06/00 TD:  09/08/00 Job: [id number]

## 2011-04-12 LAB — COMPREHENSIVE METABOLIC PANEL
ALT: 28
AST: 28
Albumin: 3.5
Alkaline Phosphatase: 63
BUN: 21
CO2: 26
Calcium: 9.2
Chloride: 107
Creatinine, Ser: 0.81
GFR calc Af Amer: >60
GFR calc non Af Amer: >60
Glucose, Bld: 156 — ABNORMAL HIGH
Potassium: 4.1
Sodium: 141
Total Bilirubin: 0.8
Total Protein: 6.2

## 2011-04-12 LAB — CBC
HCT: 36.9 — ABNORMAL LOW
Hemoglobin: 11.1 — ABNORMAL LOW
Hemoglobin: 12.2 — ABNORMAL LOW
Hemoglobin: 12.4 — ABNORMAL LOW
MCHC: 33.1
MCHC: 33.5
MCV: 88.6
MCV: 89
MCV: 89.6
Platelets: 143 — ABNORMAL LOW
Platelets: 186
RBC: 3.73 — ABNORMAL LOW
RBC: 3.8 — ABNORMAL LOW
RBC: 4.15 — ABNORMAL LOW
RBC: 4.16 — ABNORMAL LOW
RDW: 14.5 — ABNORMAL HIGH
WBC: 11.3 — ABNORMAL HIGH
WBC: 6.7

## 2011-04-12 LAB — I-STAT 8, (EC8 V) (CONVERTED LAB)
Acid-Base Excess: 1
BUN: 22
Bicarbonate: 23.7
Chloride: 106
Glucose, Bld: 150 — ABNORMAL HIGH
HCT: 38 — ABNORMAL LOW
Hemoglobin: 12.9 — ABNORMAL LOW
Operator id: 288331
Potassium: 4.1
Sodium: 138
TCO2: 25
pCO2, Ven: 29.6 — ABNORMAL LOW
pH, Ven: 7.512 — ABNORMAL HIGH

## 2011-04-12 LAB — LIPID PANEL
Cholesterol: 131
HDL: 45
LDL Cholesterol: 73
Total CHOL/HDL Ratio: 2.9
Triglycerides: 64
VLDL: 13

## 2011-04-12 LAB — POCT CARDIAC MARKERS
CKMB, poc: 3.5
Myoglobin, poc: 144
Operator id: 288331
Troponin i, poc: 0.26 — ABNORMAL HIGH

## 2011-04-12 LAB — DIFFERENTIAL
Basophils Absolute: 0
Basophils Absolute: 0.1
Basophils Relative: 0
Basophils Relative: 1
Eosinophils Absolute: 0.3
Eosinophils Relative: 3
Lymphocytes Relative: 25
Lymphs Abs: 2.4
Monocytes Absolute: 0.5
Monocytes Relative: 5
Neutro Abs: 6.5
Neutro Abs: 9.3 — ABNORMAL HIGH
Neutrophils Relative %: 67
Neutrophils Relative %: 82 — ABNORMAL HIGH

## 2011-04-12 LAB — BASIC METABOLIC PANEL
Calcium: 8.7
Calcium: 8.9
Chloride: 108
Creatinine, Ser: 0.89
Creatinine, Ser: 0.99
GFR calc Af Amer: >60
GFR calc Af Amer: >60
GFR calc non Af Amer: >60
GFR calc non Af Amer: >60
GFR calc non Af Amer: >60
Glucose, Bld: 105 — ABNORMAL HIGH
Potassium: 3.3 — ABNORMAL LOW
Sodium: 141
Sodium: 144

## 2011-04-12 LAB — APTT: aPTT: 28

## 2011-04-12 LAB — PROTIME-INR
INR: 0.9
Prothrombin Time: 12.8

## 2011-04-12 LAB — CK TOTAL AND CKMB (NOT AT ARMC): Total CK: 73

## 2011-04-12 LAB — HEPARIN LEVEL (UNFRACTIONATED)
Heparin Unfractionated: 0.52
Heparin Unfractionated: 1.09 — ABNORMAL HIGH
Heparin Unfractionated: <0.1 — ABNORMAL LOW

## 2011-04-12 LAB — CARDIAC PANEL(CRET KIN+CKTOT+MB+TROPI)
CK, MB: 12.6 — ABNORMAL HIGH
Relative Index: 10.7 — ABNORMAL HIGH
Total CK: 118
Troponin I: 2.4

## 2011-04-12 LAB — TROPONIN I: Troponin I: 0.68

## 2011-04-12 LAB — POCT I-STAT CREATININE: Operator id: 288331

## 2011-08-27 DIAGNOSIS — L578 Other skin changes due to chronic exposure to nonionizing radiation: Secondary | ICD-10-CM | POA: Diagnosis not present

## 2011-08-27 DIAGNOSIS — L570 Actinic keratosis: Secondary | ICD-10-CM | POA: Diagnosis not present

## 2011-08-27 DIAGNOSIS — D1801 Hemangioma of skin and subcutaneous tissue: Secondary | ICD-10-CM | POA: Diagnosis not present

## 2011-08-27 DIAGNOSIS — L723 Sebaceous cyst: Secondary | ICD-10-CM | POA: Diagnosis not present

## 2011-08-27 DIAGNOSIS — L821 Other seborrheic keratosis: Secondary | ICD-10-CM | POA: Diagnosis not present

## 2011-10-09 DIAGNOSIS — H4011X0 Primary open-angle glaucoma, stage unspecified: Secondary | ICD-10-CM | POA: Diagnosis not present

## 2011-11-30 DIAGNOSIS — G609 Hereditary and idiopathic neuropathy, unspecified: Secondary | ICD-10-CM | POA: Diagnosis not present

## 2011-11-30 DIAGNOSIS — I2510 Atherosclerotic heart disease of native coronary artery without angina pectoris: Secondary | ICD-10-CM | POA: Diagnosis not present

## 2011-11-30 DIAGNOSIS — E785 Hyperlipidemia, unspecified: Secondary | ICD-10-CM | POA: Diagnosis not present

## 2011-11-30 DIAGNOSIS — E1159 Type 2 diabetes mellitus with other circulatory complications: Secondary | ICD-10-CM | POA: Diagnosis not present

## 2012-01-08 DIAGNOSIS — H4011X0 Primary open-angle glaucoma, stage unspecified: Secondary | ICD-10-CM | POA: Diagnosis not present

## 2012-01-31 ENCOUNTER — Inpatient Hospital Stay (HOSPITAL_COMMUNITY)
Admission: EM | Admit: 2012-01-31 | Discharge: 2012-02-05 | DRG: 683 | Disposition: A | Discharge status: Home Health | Payer: Medicare Other | Attending: Internal Medicine | Admitting: Internal Medicine

## 2012-01-31 ENCOUNTER — Encounter (HOSPITAL_COMMUNITY): Payer: Self-pay | Admitting: Emergency Medicine

## 2012-01-31 ENCOUNTER — Emergency Department (HOSPITAL_COMMUNITY): Payer: Medicare Other

## 2012-01-31 ENCOUNTER — Inpatient Hospital Stay (HOSPITAL_COMMUNITY): Payer: Medicare Other

## 2012-01-31 DIAGNOSIS — R4182 Altered mental status, unspecified: Secondary | ICD-10-CM | POA: Diagnosis not present

## 2012-01-31 DIAGNOSIS — E785 Hyperlipidemia, unspecified: Secondary | ICD-10-CM | POA: Diagnosis not present

## 2012-01-31 DIAGNOSIS — Z79899 Other long term (current) drug therapy: Secondary | ICD-10-CM

## 2012-01-31 DIAGNOSIS — R82998 Other abnormal findings in urine: Secondary | ICD-10-CM | POA: Diagnosis not present

## 2012-01-31 DIAGNOSIS — N289 Disorder of kidney and ureter, unspecified: Secondary | ICD-10-CM

## 2012-01-31 DIAGNOSIS — N179 Acute kidney failure, unspecified: Secondary | ICD-10-CM | POA: Diagnosis not present

## 2012-01-31 DIAGNOSIS — I2510 Atherosclerotic heart disease of native coronary artery without angina pectoris: Secondary | ICD-10-CM | POA: Diagnosis not present

## 2012-01-31 DIAGNOSIS — E860 Dehydration: Secondary | ICD-10-CM | POA: Diagnosis present

## 2012-01-31 DIAGNOSIS — I10 Essential (primary) hypertension: Secondary | ICD-10-CM | POA: Diagnosis present

## 2012-01-31 DIAGNOSIS — E119 Type 2 diabetes mellitus without complications: Secondary | ICD-10-CM

## 2012-01-31 DIAGNOSIS — E1129 Type 2 diabetes mellitus with other diabetic kidney complication: Secondary | ICD-10-CM | POA: Diagnosis present

## 2012-01-31 DIAGNOSIS — K219 Gastro-esophageal reflux disease without esophagitis: Secondary | ICD-10-CM | POA: Diagnosis not present

## 2012-01-31 DIAGNOSIS — R51 Headache: Secondary | ICD-10-CM | POA: Diagnosis not present

## 2012-01-31 DIAGNOSIS — I252 Old myocardial infarction: Secondary | ICD-10-CM

## 2012-01-31 DIAGNOSIS — F29 Unspecified psychosis not due to a substance or known physiological condition: Secondary | ICD-10-CM | POA: Diagnosis not present

## 2012-01-31 DIAGNOSIS — R748 Abnormal levels of other serum enzymes: Secondary | ICD-10-CM | POA: Diagnosis not present

## 2012-01-31 DIAGNOSIS — Z7982 Long term (current) use of aspirin: Secondary | ICD-10-CM | POA: Diagnosis not present

## 2012-01-31 DIAGNOSIS — D649 Anemia, unspecified: Secondary | ICD-10-CM | POA: Diagnosis present

## 2012-01-31 DIAGNOSIS — E875 Hyperkalemia: Secondary | ICD-10-CM | POA: Diagnosis present

## 2012-01-31 DIAGNOSIS — N390 Urinary tract infection, site not specified: Secondary | ICD-10-CM | POA: Diagnosis not present

## 2012-01-31 DIAGNOSIS — IMO0001 Reserved for inherently not codable concepts without codable children: Secondary | ICD-10-CM | POA: Diagnosis present

## 2012-01-31 DIAGNOSIS — N281 Cyst of kidney, acquired: Secondary | ICD-10-CM | POA: Diagnosis not present

## 2012-01-31 DIAGNOSIS — R404 Transient alteration of awareness: Secondary | ICD-10-CM | POA: Diagnosis not present

## 2012-01-31 HISTORY — DX: Pure hypercholesterolemia, unspecified: E78.00

## 2012-01-31 HISTORY — DX: Essential (primary) hypertension: I10

## 2012-01-31 HISTORY — DX: Gastro-esophageal reflux disease without esophagitis: K21.9

## 2012-01-31 LAB — BASIC METABOLIC PANEL
BUN: 69 mg/dL — ABNORMAL HIGH (ref 6–23)
BUN: 66 mg/dL — ABNORMAL HIGH (ref 6–23)
CO2: 18 mEq/L — ABNORMAL LOW (ref 19–32)
CO2: 21 mEq/L (ref 19–32)
Calcium: 9.5 mg/dL (ref 8.4–10.5)
Chloride: 106 mEq/L (ref 96–112)
Chloride: 108 mEq/L (ref 96–112)
Creatinine, Ser: 5.85 mg/dL — ABNORMAL HIGH (ref 0.50–1.35)
Glucose, Bld: 110 mg/dL — ABNORMAL HIGH (ref 70–99)
Glucose, Bld: 179 mg/dL — ABNORMAL HIGH (ref 70–99)
Potassium: 5.4 mEq/L — ABNORMAL HIGH (ref 3.5–5.1)

## 2012-01-31 LAB — ETHANOL: Alcohol, Ethyl (B): <11 mg/dL (ref 0–11)

## 2012-01-31 LAB — CBC WITH DIFFERENTIAL/PLATELET
Basophils Absolute: 0 10*3/uL (ref 0.0–0.1)
Basophils Relative: 0 % (ref 0–1)
Eosinophils Absolute: 0.3 10*3/uL (ref 0.0–0.7)
Eosinophils Relative: 3 % (ref 0–5)
Eosinophils Relative: 3 % (ref 0–5)
HCT: 34.6 % — ABNORMAL LOW (ref 39.0–52.0)
Hemoglobin: 11.2 g/dL — ABNORMAL LOW (ref 13.0–17.0)
Hemoglobin: 11.2 g/dL — ABNORMAL LOW (ref 13.0–17.0)
Lymphocytes Relative: 12 % (ref 12–46)
Lymphs Abs: 1.1 10*3/uL (ref 0.7–4.0)
Lymphs Abs: 1.1 10*3/uL (ref 0.7–4.0)
MCH: 30.2 pg (ref 26.0–34.0)
MCHC: 32.5 g/dL (ref 30.0–36.0)
MCV: 93.3 fL (ref 78.0–100.0)
MCV: 93 fL (ref 78.0–100.0)
Monocytes Absolute: 0.8 10*3/uL (ref 0.1–1.0)
Monocytes Relative: 9 % (ref 3–12)
Monocytes Relative: 10 % (ref 3–12)
Neutro Abs: 7 10*3/uL (ref 1.7–7.7)
Neutrophils Relative %: 75 % (ref 43–77)
RBC: 3.71 MIL/uL — ABNORMAL LOW (ref 4.22–5.81)
RDW: 14.7 % (ref 11.5–15.5)
WBC: 9.2 10*3/uL (ref 4.0–10.5)

## 2012-01-31 LAB — URINALYSIS, ROUTINE W REFLEX MICROSCOPIC
Glucose, UA: NEGATIVE mg/dL
Ketones, ur: 15 mg/dL — AB
Nitrite: NEGATIVE
Specific Gravity, Urine: 1.015 (ref 1.005–1.030)
pH: 5.5 (ref 5.0–8.0)

## 2012-01-31 LAB — COMPREHENSIVE METABOLIC PANEL
Albumin: 3.3 g/dL — ABNORMAL LOW (ref 3.5–5.2)
Alkaline Phosphatase: 67 U/L (ref 39–117)
BUN: 68 mg/dL — ABNORMAL HIGH (ref 6–23)
Calcium: 9.5 mg/dL (ref 8.4–10.5)
Creatinine, Ser: 5.91 mg/dL — ABNORMAL HIGH (ref 0.50–1.35)
GFR calc Af Amer: 9 mL/min — ABNORMAL LOW (ref >90)
Glucose, Bld: 108 mg/dL — ABNORMAL HIGH (ref 70–99)
Potassium: 5.8 mEq/L — ABNORMAL HIGH (ref 3.5–5.1)
Total Protein: 6.5 g/dL (ref 6.0–8.3)

## 2012-01-31 LAB — RAPID URINE DRUG SCREEN, HOSP PERFORMED
Amphetamines: NOT DETECTED
Benzodiazepines: NOT DETECTED
Opiates: NOT DETECTED

## 2012-01-31 LAB — POCT I-STAT TROPONIN I: Troponin i, poc: 0.02 ng/mL (ref 0.00–0.08)

## 2012-01-31 LAB — URINE MICROSCOPIC-ADD ON

## 2012-01-31 LAB — ACETAMINOPHEN LEVEL: Acetaminophen (Tylenol), Serum: <15 ug/mL (ref 10–30)

## 2012-01-31 LAB — SALICYLATE LEVEL: Salicylate Lvl: <2 mg/dL — ABNORMAL LOW (ref 2.8–20.0)

## 2012-01-31 LAB — PROTIME-INR: Prothrombin Time: 14.6 seconds (ref 11.6–15.2)

## 2012-01-31 MED ORDER — VITAMIN B-6 100 MG PO TABS
100.0000 mg | ORAL_TABLET | Freq: Two times a day (BID) | ORAL | Status: DC
  Administered 2012-01-31 – 2012-02-05 (×9): 100 mg via ORAL
  Filled 2012-01-31 (×11): qty 1

## 2012-01-31 MED ORDER — CYCLOSPORINE 0.05 % OP EMUL
2.0000 [drp] | Freq: Two times a day (BID) | OPHTHALMIC | Status: DC
  Administered 2012-01-31: 2 [drp] via OPHTHALMIC
  Administered 2012-01-31: via OPHTHALMIC
  Administered 2012-02-01 – 2012-02-05 (×8): 2 [drp] via OPHTHALMIC
  Filled 2012-01-31 (×11): qty 1

## 2012-01-31 MED ORDER — HYDROMORPHONE HCL PF 1 MG/ML IJ SOLN
1.0000 mg | INTRAMUSCULAR | Status: DC | PRN
  Administered 2012-02-01: 1 mg via INTRAVENOUS
  Filled 2012-01-31: qty 1

## 2012-01-31 MED ORDER — DEXTROSE 50 % IV SOLN
25.0000 mL | Freq: Once | INTRAVENOUS | Status: AC
  Administered 2012-01-31: 25 mL via INTRAVENOUS
  Filled 2012-01-31: qty 50

## 2012-01-31 MED ORDER — ACETAMINOPHEN 650 MG RE SUPP: 650.0000 mg | Freq: Four times a day (QID) | RECTAL | Status: DC | PRN

## 2012-01-31 MED ORDER — DEXTROSE 5 % IV SOLN
1.0000 g | INTRAVENOUS | Status: DC
  Administered 2012-01-31 – 2012-02-04 (×5): 1 g via INTRAVENOUS
  Filled 2012-01-31 (×6): qty 10

## 2012-01-31 MED ORDER — PRESERVISION/LUTEIN PO CAPS: 1.0000 | ORAL_CAPSULE | Freq: Two times a day (BID) | ORAL | Status: DC

## 2012-01-31 MED ORDER — INSULIN ASPART 100 UNIT/ML ~~LOC~~ SOLN
0.0000 [IU] | Freq: Three times a day (TID) | SUBCUTANEOUS | Status: DC
  Administered 2012-02-01: 3 [IU] via SUBCUTANEOUS
  Administered 2012-02-01: 2 [IU] via SUBCUTANEOUS

## 2012-01-31 MED ORDER — ISOSORBIDE MONONITRATE ER 30 MG PO TB24
30.0000 mg | ORAL_TABLET | Freq: Every day | ORAL | Status: DC
  Administered 2012-01-31 – 2012-02-05 (×6): 30 mg via ORAL
  Filled 2012-01-31 (×6): qty 1

## 2012-01-31 MED ORDER — OCUVITE-LUTEIN PO CAPS
1.0000 | ORAL_CAPSULE | Freq: Two times a day (BID) | ORAL | Status: DC
  Administered 2012-01-31 – 2012-02-01 (×2): 1 via ORAL
  Filled 2012-01-31 (×5): qty 1

## 2012-01-31 MED ORDER — DEXTROSE 5 % IV SOLN
1.0000 g | INTRAVENOUS | Status: DC
  Filled 2012-01-31: qty 10

## 2012-01-31 MED ORDER — SODIUM CHLORIDE 0.9 % IV SOLN
Freq: Once | INTRAVENOUS | Status: AC
  Administered 2012-02-01 (×2): 1000 mL via INTRAVENOUS

## 2012-01-31 MED ORDER — SODIUM CHLORIDE 0.9 % IJ SOLN
3.0000 mL | Freq: Two times a day (BID) | INTRAMUSCULAR | Status: DC
  Administered 2012-01-31: via INTRAVENOUS
  Administered 2012-02-02 – 2012-02-05 (×4): 3 mL via INTRAVENOUS

## 2012-01-31 MED ORDER — INSULIN ASPART 100 UNIT/ML ~~LOC~~ SOLN
0.0000 [IU] | Freq: Every day | SUBCUTANEOUS | Status: DC
  Administered 2012-01-31 – 2012-02-02 (×2): 2 [IU] via SUBCUTANEOUS
  Administered 2012-02-04: 3 [IU] via SUBCUTANEOUS

## 2012-01-31 MED ORDER — HYDROCODONE-ACETAMINOPHEN 5-325 MG PO TABS
1.0000 | ORAL_TABLET | ORAL | Status: DC | PRN
  Administered 2012-02-01: 1 via ORAL
  Filled 2012-01-31: qty 1

## 2012-01-31 MED ORDER — CALCIUM CARBONATE-VITAMIN D 500-200 MG-UNIT PO TABS
1.0000 | ORAL_TABLET | Freq: Two times a day (BID) | ORAL | Status: DC
  Administered 2012-01-31 – 2012-02-05 (×9): 1 via ORAL
  Filled 2012-01-31 (×11): qty 1

## 2012-01-31 MED ORDER — TIMOLOL HEMIHYDRATE 0.5 % OP SOLN: 1.0000 [drp] | Freq: Every day | OPHTHALMIC | Status: DC

## 2012-01-31 MED ORDER — SODIUM CHLORIDE 0.9 % IV SOLN
INTRAVENOUS | Status: DC
  Administered 2012-01-31: 15:00:00 via INTRAVENOUS

## 2012-01-31 MED ORDER — PIOGLITAZONE HCL 30 MG PO TABS
30.0000 mg | ORAL_TABLET | Freq: Every day | ORAL | Status: DC
  Administered 2012-02-01 – 2012-02-05 (×5): 30 mg via ORAL
  Filled 2012-01-31 (×8): qty 1

## 2012-01-31 MED ORDER — ASPIRIN EC 81 MG PO TBEC
162.0000 mg | DELAYED_RELEASE_TABLET | Freq: Every day | ORAL | Status: DC
  Administered 2012-01-31 – 2012-02-05 (×6): 162 mg via ORAL
  Filled 2012-01-31 (×6): qty 2

## 2012-01-31 MED ORDER — ONDANSETRON HCL 4 MG PO TABS
4.0000 mg | ORAL_TABLET | Freq: Four times a day (QID) | ORAL | Status: DC | PRN
  Administered 2012-02-02: 4 mg via ORAL
  Filled 2012-01-31: qty 1

## 2012-01-31 MED ORDER — SODIUM POLYSTYRENE SULFONATE 15 GM/60ML PO SUSP
30.0000 g | Freq: Once | ORAL | Status: AC
  Administered 2012-01-31: 30 g via ORAL
  Filled 2012-01-31: qty 120

## 2012-01-31 MED ORDER — CALCIUM-VITAMIN D 500-200 MG-UNIT PO TABS: 1.0000 | ORAL_TABLET | Freq: Two times a day (BID) | ORAL | Status: DC

## 2012-01-31 MED ORDER — ONDANSETRON HCL 4 MG/2ML IJ SOLN
4.0000 mg | Freq: Four times a day (QID) | INTRAMUSCULAR | Status: DC | PRN
  Administered 2012-02-01 – 2012-02-02 (×2): 4 mg via INTRAVENOUS
  Filled 2012-01-31 (×2): qty 2

## 2012-01-31 MED ORDER — MULTI-VITAMIN/MINERALS PO TABS: 1.0000 | ORAL_TABLET | Freq: Every day | ORAL | Status: DC

## 2012-01-31 MED ORDER — PANTOPRAZOLE SODIUM 40 MG PO TBEC
40.0000 mg | DELAYED_RELEASE_TABLET | Freq: Every day | ORAL | Status: DC
  Administered 2012-02-01 – 2012-02-05 (×5): 40 mg via ORAL
  Filled 2012-01-31 (×5): qty 1

## 2012-01-31 MED ORDER — ADULT MULTIVITAMIN W/MINERALS CH
1.0000 | ORAL_TABLET | Freq: Every day | ORAL | Status: DC
  Administered 2012-01-31 – 2012-02-05 (×6): 1 via ORAL
  Filled 2012-01-31 (×6): qty 1

## 2012-01-31 MED ORDER — ACETAMINOPHEN 325 MG PO TABS
650.0000 mg | ORAL_TABLET | Freq: Four times a day (QID) | ORAL | Status: DC | PRN
Start: 2012-01-31 — End: 2012-02-05
  Administered 2012-02-01: 650 mg via ORAL
  Filled 2012-01-31 (×2): qty 2

## 2012-01-31 MED ORDER — INSULIN ASPART 100 UNIT/ML ~~LOC~~ SOLN
10.0000 [IU] | Freq: Once | SUBCUTANEOUS | Status: AC
  Administered 2012-01-31: 10 [IU] via INTRAVENOUS

## 2012-01-31 MED ORDER — ALUM & MAG HYDROXIDE-SIMETH 200-200-20 MG/5ML PO SUSP
30.0000 mL | Freq: Four times a day (QID) | ORAL | Status: DC | PRN
  Filled 2012-01-31: qty 30

## 2012-01-31 MED ORDER — HEPARIN SODIUM (PORCINE) 5000 UNIT/ML IJ SOLN
5000.0000 [IU] | Freq: Three times a day (TID) | INTRAMUSCULAR | Status: DC
  Administered 2012-01-31 – 2012-02-05 (×12): 5000 [IU] via SUBCUTANEOUS
  Filled 2012-01-31 (×17): qty 1

## 2012-01-31 MED ORDER — INSULIN ASPART 100 UNIT/ML ~~LOC~~ SOLN
10.0000 [IU] | Freq: Once | SUBCUTANEOUS | Status: DC
  Filled 2012-01-31: qty 1

## 2012-01-31 MED ORDER — ATORVASTATIN CALCIUM 40 MG PO TABS
40.0000 mg | ORAL_TABLET | Freq: Every day | ORAL | Status: DC
  Administered 2012-02-02 – 2012-02-04 (×3): 40 mg via ORAL
  Filled 2012-01-31 (×5): qty 1

## 2012-01-31 MED ORDER — TIMOLOL MALEATE 0.5 % OP SOLN
1.0000 [drp] | Freq: Every day | OPHTHALMIC | Status: DC
  Administered 2012-01-31 – 2012-02-01 (×2): 1 [drp] via OPHTHALMIC
  Filled 2012-01-31: qty 5

## 2012-01-31 MED ORDER — SODIUM CHLORIDE 0.9 % IV SOLN
INTRAVENOUS | Status: DC
  Administered 2012-01-31 (×2): 1000 mL via INTRAVENOUS
  Administered 2012-02-01 – 2012-02-03 (×5): via INTRAVENOUS

## 2012-01-31 NOTE — Progress Notes (Signed)
Reason for Consult: AKI Referring Physician:   HPI:  Victor Wallace is an 78 y.o. male. With PMH of MI, HTN, DM-2 ( for abut 9 years), HLD and GERD, who presents with intermittent AMS for 3 days.  Patient reports that he had loose stool for 5-6 days. He usually has one bowel movement in average each day, but he had 3-4  bowel movements with loose stools in the past 5-6 days. He did not have nausea, vomiting or abdominal pain. He did not pay much attention to his loose stools. Since 3 days ago, patient became confused intermittently. He could not remember how to use his I-Pad. He also feels very slow recently and has very poor memory. There is no specific pattern for his confusion. Sometimes, he feels confused in the morning, and in the other time,  he feels confused in the afternoon. Yesterday had called his PCP, Dr. Osborne Casco, who thought it might be a UTI and prescribed Cipro without improvement.  He got confused again and had incontinence of urine in this morning. Therefore he was brought to [facility] ED for evaluation. Patient was found to have Cre of 5.91, BUN of 68 and K of 5.8. Of note, he had a heart attack about 5 years ago, and had had transient global amnesia in conjunction with that illness.  Patient reports having mild dysuria and urinary frequency since yesterday. He denies fever, chills, fatigue, headaches, cough, chest pain, SOB,  abdominal pain, hematuria.   PMH:   Past Medical History  Diagnosis Date  . MI (myocardial infarction)   . Hypertension   . Diabetes mellitus   . Hypercholesterolemia   . GERD (gastroesophageal reflux disease)     PSH:   Past Surgical History  Procedure Date  . Hip surgery   . Tonsillectomy     Allergies: No Known Allergies  Medications:   Prior to Admission medications   Medication Sig Start Date End Date Taking? Authorizing Provider  aspirin EC 81 MG tablet Take 162 mg by mouth daily.   Yes Historical Provider, MD  atorvastatin  (LIPITOR) 40 MG tablet Take 40 mg by mouth daily.   Yes Historical Provider, MD  Calcium Carbonate-Vitamin D (CALCIUM-VITAMIN D) 500-200 MG-UNIT per tablet Take 1 tablet by mouth 2 (two) times daily with a meal.   Yes Historical Provider, MD  cycloSPORINE (RESTASIS) 0.05 % ophthalmic emulsion Place 2 drops into both eyes 2 (two) times daily.   Yes Historical Provider, MD  docusate sodium (COLACE) 100 MG capsule Take 100 mg by mouth 2 (two) times daily.   Yes Historical Provider, MD  fish oil-omega-3 fatty acids 1000 MG capsule Take 1 g by mouth 3 (three) times daily.   Yes Historical Provider, MD  glimepiride (AMARYL) 4 MG tablet Take 4 mg by mouth daily before breakfast.   Yes Historical Provider, MD  isosorbide mononitrate (IMDUR) 30 MG 24 hr tablet Take 30 mg by mouth daily.   Yes Historical Provider, MD  metFORMIN (GLUCOPHAGE) 500 MG tablet Take 500 mg by mouth 4 (four) times daily.   Yes Historical Provider, MD  Multiple Vitamins-Minerals (MULTIVITAMIN WITH MINERALS) tablet Take 1 tablet by mouth daily.   Yes Historical Provider, MD  Multiple Vitamins-Minerals (PRESERVISION/LUTEIN) CAPS Take 1 capsule by mouth 2 (two) times daily.   Yes Historical Provider, MD  naproxen sodium (ANAPROX) 220 MG tablet Take 440 mg by mouth daily.   Yes Historical Provider, MD  Omeprazole (PRILOSEC PO) Take 1 capsule by mouth  daily.   Yes Historical Provider, MD  pioglitazone (ACTOS) 30 MG tablet Take 30 mg by mouth 3 (three) times daily.   Yes Historical Provider, MD  psyllium (METAMUCIL) 58.6 % powder Take 1 packet by mouth daily.   Yes Historical Provider, MD  pyridOXINE (VITAMIN B-6) 100 MG tablet Take 100 mg by mouth 2 (two) times daily.   Yes Historical Provider, MD  sitaGLIPtin (JANUVIA) 100 MG tablet Take 100 mg by mouth daily.   Yes Historical Provider, MD  timolol (BETIMOL) 0.5 % ophthalmic solution Place 1 drop into the left eye daily.   Yes Historical Provider, MD    Discontinued Meds:   Medications  Discontinued During This Encounter  Medication Reason  . insulin aspart (novoLOG) injection 10 Units    Physical Exam:   Bp 169/84; HR 73, T 97.9 F; RR 14, SpO2 100.00%.  General: resting in bed, not in acute distress HEENT: PERRL, EOMI, no scleral icterus Cardiac: S1/S2, RRR, No murmurs, gallops or rubs Pulm: Good air movement bilaterally, Clear to auscultation bilaterally, No rales, wheezing, rhonchi or rubs. Abd: Soft,  nondistended, nontender, no rebound pain, no organomegaly, BS present Ext: 1+ pitting edema bilaterally. 2+DP/PT pulse bilaterally Musculoskeletal: No joint deformities, erythema, or stiffness, ROM full and no nontender Skin: no rashes. No skin bruise. Neuro: alert and oriented X3, cranial nerves II-XII grossly intact, muscle strength 5/5 in all extremeties, sensation to light touch intact. 2+ brachial reflex bilaterally. Babinski's sign negative.   Creatinine, Ser  Date/Time Value Range Status  01/31/2012 11:30 AM 5.91* 0.50 - 1.35 mg/dL Final  01/31/2012 10:56 AM 5.85* 0.50 - 1.35 mg/dL Final  03/22/2007  4:40 AM 0.89   Final  03/21/2007  5:25 AM 0.99   Final  03/20/2007  3:45 AM 0.87   Final  03/19/2007  3:10 PM 0.81   Final  03/19/2007  3:09 PM 0.9   Final     Lab 01/31/12 1130 01/31/12 1056  HGB 11.2* 11.2*  HCT 34.5* 34.6*  WBC 8.9 9.2  PLT 176 175    Results for orders placed during the hospital encounter of 01/31/12 (from the past 48 hour(s))  CBC WITH DIFFERENTIAL     Status: Abnormal   Collection Time   01/31/12 10:56 AM      Component Value Range Comment   WBC 9.2  4.0 - 10.5 K/uL    RBC 3.71 (*) 4.22 - 5.81 MIL/uL    Hemoglobin 11.2 (*) 13.0 - 17.0 g/dL    HCT 34.6 (*) 39.0 - 52.0 %    MCV 93.3  78.0 - 100.0 fL    MCH 30.2  26.0 - 34.0 pg    MCHC 32.4  30.0 - 36.0 g/dL    RDW 14.7  11.5 - 15.5 %    Platelets 175  150 - 400 K/uL    Neutrophils Relative 76  43 - 77 %    Neutro Abs 7.0  1.7 - 7.7 K/uL    Lymphocytes Relative 12  12 - 46 %     Lymphs Abs 1.1  0.7 - 4.0 K/uL    Monocytes Relative 9  3 - 12 %    Monocytes Absolute 0.8  0.1 - 1.0 K/uL    Eosinophils Relative 3  0 - 5 %    Eosinophils Absolute 0.3  0.0 - 0.7 K/uL    Basophils Relative 0  0 - 1 %    Basophils Absolute 0.0  0.0 - 0.1 K/uL  BASIC METABOLIC PANEL     Status: Abnormal   Collection Time   01/31/12 10:56 AM      Component Value Range Comment   Sodium 139  135 - 145 mEq/L    Potassium 5.9 (*) 3.5 - 5.1 mEq/L    Chloride 106  96 - 112 mEq/L    CO2 18 (*) 19 - 32 mEq/L    Glucose, Bld 110 (*) 70 - 99 mg/dL    BUN 69 (*) 6 - 23 mg/dL    Creatinine, Ser 5.85 (*) 0.50 - 1.35 mg/dL    Calcium 9.5  8.4 - 10.5 mg/dL    GFR calc non Af Amer 8 (*) >90 mL/min    GFR calc Af Amer 9 (*) >90 mL/min   CBC WITH DIFFERENTIAL     Status: Abnormal   Collection Time   01/31/12 11:30 AM      Component Value Range Comment   WBC 8.9  4.0 - 10.5 K/uL    RBC 3.71 (*) 4.22 - 5.81 MIL/uL    Hemoglobin 11.2 (*) 13.0 - 17.0 g/dL    HCT 34.5 (*) 39.0 - 52.0 %    MCV 93.0  78.0 - 100.0 fL    MCH 30.2  26.0 - 34.0 pg    MCHC 32.5  30.0 - 36.0 g/dL    RDW 14.7  11.5 - 15.5 %    Platelets 176  150 - 400 K/uL    Neutrophils Relative 75  43 - 77 %    Neutro Abs 6.7  1.7 - 7.7 K/uL    Lymphocytes Relative 12  12 - 46 %    Lymphs Abs 1.1  0.7 - 4.0 K/uL    Monocytes Relative 10  3 - 12 %    Monocytes Absolute 0.9  0.1 - 1.0 K/uL    Eosinophils Relative 3  0 - 5 %    Eosinophils Absolute 0.3  0.0 - 0.7 K/uL    Basophils Relative 0  0 - 1 %    Basophils Absolute 0.0  0.0 - 0.1 K/uL   COMPREHENSIVE METABOLIC PANEL     Status: Abnormal   Collection Time   01/31/12 11:30 AM      Component Value Range Comment   Sodium 140  135 - 145 mEq/L    Potassium 5.8 (*) 3.5 - 5.1 mEq/L    Chloride 106  96 - 112 mEq/L    CO2 19  19 - 32 mEq/L    Glucose, Bld 108 (*) 70 - 99 mg/dL    BUN 68 (*) 6 - 23 mg/dL    Creatinine, Ser 5.91 (*) 0.50 - 1.35 mg/dL    Calcium 9.5  8.4 - 10.5 mg/dL     Total Protein 6.5  6.0 - 8.3 g/dL    Albumin 3.3 (*) 3.5 - 5.2 g/dL    AST 21  0 - 37 U/L    ALT 20  0 - 53 U/L    Alkaline Phosphatase 67  39 - 117 U/L    Total Bilirubin 0.2 (*) 0.3 - 1.2 mg/dL    GFR calc non Af Amer 8 (*) >90 mL/min    GFR calc Af Amer 9 (*) >90 mL/min   PROTIME-INR     Status: Normal   Collection Time   01/31/12 11:30 AM      Component Value Range Comment   Prothrombin Time 14.6  11.6 - 15.2 seconds    INR 1.12  0.00 -  1.49   APTT     Status: Normal   Collection Time   01/31/12 11:30 AM      Component Value Range Comment   aPTT 33  24 - 37 seconds   SALICYLATE LEVEL     Status: Abnormal   Collection Time   01/31/12 11:30 AM      Component Value Range Comment   Salicylate Lvl <2.7 (*) 2.8 - 20.0 mg/dL   ACETAMINOPHEN LEVEL     Status: Normal   Collection Time   01/31/12 11:30 AM      Component Value Range Comment   Acetaminophen (Tylenol), Serum <15.0  10 - 30 ug/mL   ETHANOL     Status: Normal   Collection Time   01/31/12 11:30 AM      Component Value Range Comment   Alcohol, Ethyl (B) <11  0 - 11 mg/dL   LACTIC ACID, PLASMA     Status: Normal   Collection Time   01/31/12 11:43 AM      Component Value Range Comment   Lactic Acid, Venous 1.2  0.5 - 2.2 mmol/L   GLUCOSE, CAPILLARY     Status: Abnormal   Collection Time   01/31/12 11:45 AM      Component Value Range Comment   Glucose-Capillary 109 (*) 70 - 99 mg/dL   URINALYSIS, ROUTINE W REFLEX MICROSCOPIC     Status: Abnormal   Collection Time   01/31/12 12:09 PM      Component Value Range Comment   Color, Urine YELLOW  YELLOW    APPearance CLEAR  CLEAR    Specific Gravity, Urine 1.015  1.005 - 1.030    pH 5.5  5.0 - 8.0    Glucose, UA NEGATIVE  NEGATIVE mg/dL    Hgb urine dipstick SMALL (*) NEGATIVE    Bilirubin Urine NEGATIVE  NEGATIVE    Ketones, ur 15 (*) NEGATIVE mg/dL    Protein, ur 30 (*) NEGATIVE mg/dL    Urobilinogen, UA 0.2  0.0 - 1.0 mg/dL    Nitrite NEGATIVE  NEGATIVE    Leukocytes, UA  MODERATE (*) NEGATIVE   URINE RAPID DRUG SCREEN (HOSP PERFORMED)     Status: Normal   Collection Time   01/31/12 12:09 PM      Component Value Range Comment   Opiates NONE DETECTED  NONE DETECTED    Cocaine NONE DETECTED  NONE DETECTED    Benzodiazepines NONE DETECTED  NONE DETECTED    Amphetamines NONE DETECTED  NONE DETECTED    Tetrahydrocannabinol NONE DETECTED  NONE DETECTED    Barbiturates NONE DETECTED  NONE DETECTED   URINE MICROSCOPIC-ADD ON     Status: Abnormal   Collection Time   01/31/12 12:09 PM      Component Value Range Comment   Squamous Epithelial / LPF RARE  RARE    WBC, UA 11-20  <3 WBC/hpf    RBC / HPF 3-6  <3 RBC/hpf    Bacteria, UA FEW (*) RARE   POCT I-STAT TROPONIN I     Status: Normal   Collection Time   01/31/12 12:13 PM      Component Value Range Comment   Troponin i, poc 0.02  0.00 - 0.08 ng/mL    Comment 3            GLUCOSE, CAPILLARY     Status: Abnormal   Collection Time   01/31/12  3:02 PM      Component Value Range Comment  Glucose-Capillary 157 (*) 70 - 99 mg/dL     Ct Head Wo Contrast  01/31/2012  *RADIOLOGY REPORT*  Clinical Data: Altered mental status  CT HEAD WITHOUT CONTRAST  Technique:  Contiguous axial images were obtained from the base of the skull through the vertex without contrast.  Comparison: CT scan of the head 03/19/2007, MRI of the brain 03/19/2007  Findings: No acute intracranial hemorrhage, acute infarction, mass, mass lesion, hydrocephalus or midline shift.  The gray-white interface is well defined.  Very mild cerebral volume loss for age which appears unchanged without significant interval progression compared to the prior study.  Stable small lacunar infarct in the left putamen.  Mastoid air cells are well-aerated.  There are several small polyps versus mucous retention cysts along the medial walls of the bilateral maxillary sinuses. Small air fluid level dependently within the left sphenoid air cell.  The globes intact. No bony  calvarial abnormality.  IMPRESSION:  1.  No acute intracranial abnormality. 2.  Mild age related senescent changes without significant interval progression compared to 03/19/2007.  Original Report Authenticated By: Myrle Sheng    Assessment/Plan:  # AKI: It is mostly likely due to pre-renal. Patient had loose stool and decreased oral intake in past 5-6 days. Currently patient does not seem to be fluid overloaded. He does not have shortness of breath. His oxygen saturation is 100%. His lung auscultation is clear bilaterally. Patient has bilateral lower leg edema, but it is chronic and is not worsening recently per patient. UA is positive for UTI which may also contribute his AKI. In addition, patient has been on   -will treat will IV fluid: 150 cc/h for 10 hours. -will check FeNa -will get renal US  -will get urine culture -will treat empirically with IV rocephin -will d/c Naproxen  #: AMS: It is likely due to delirium secondary to urinary tract infection and AKI. Will treat the underlying causes as above.  #: Hyperkalemia: Potassium is 5.8. Patient was treated with IV Novology insulin, 10 U and 15 gram of Kayexalate in ED. Will recheck BMP at 7:00 PM.  #: DM-2: SSI and d/c metformin  #: HTN: Bp is 169/84. Per primary team.  #. Anemia: etiology is not clear.  Will check anemia panel.  Ivor Costa, MD PGY2, Internal Medicine Teaching Service Pager: [phone]         Ivor Costa 01/31/2012, 3:27 PM

## 2012-01-31 NOTE — H&P (Addendum)
History and Physical       Hospital Admission Note Date: 01/31/2012  Patient name: Victor Wallace Medical record number: [id number] Date of birth: January 10, 1925 Age: 78 y.o. Gender: male PCP: Haywood Pao, MD   Chief Complaint:  Confusion with UTI  HPI: Patient is 78 year old male with history of coronary disease, hypertension, diabetes on 4 oral hypoglycemic agents, hyperlipidemia, GERD presented to ED with confusion and further evaluation. History was obtained from the patient and his wife present in the room. Patient reported that he started initially having diarrhea with 3-4 bowel movements for 2-3 days, which has resolved over the weekend. Subsequently he had poor appetite, but denied any nausea or vomiting, abdominal pain, fevers or chills. Patient's wife and started noticing that he was becoming more confused in the last 2 days, call his PCP who thought that it might be UTI and prescribed ciprofloxacin. Patient has taken 2 doses of ciprofloxacin without any significant improvement. The wife felt that his confusion was worsening and brought him to the ED, workup showed creatinine of 5.9 with a BUN of 68, potassium of 5.8, patient was found to have a UTI.  Review of Systems:  Constitutional: Denies fever, chills, diaphoresis, patient had poor appetite in the last several days with generalized fatigue HEENT: Denies photophobia, eye pain, redness, hearing loss, ear pain, congestion, sore throat, rhinorrhea, sneezing, mouth sores, trouble swallowing, neck pain, neck stiffness and tinnitus.   Respiratory: Denies SOB, DOE, cough, chest tightness,  and wheezing.   Cardiovascular: Denies chest pain, palpitations and leg swelling.  Gastrointestinal: Denies nausea, vomiting, abdominal pain, please see history of present illness  Genitourinary: Patient denied any significant dysuria, urgency, frequency, hematuria, flank pain and difficulty  urinating.  Musculoskeletal: Denies myalgias, joint swelling, arthralgias. He has chronic back pain and gait issues due to her previous hip surgeries  Skin: Denies pallor, rash and wound.  Neurological: Denies dizziness, seizures, syncope, light-headedness, numbness and headaches.  he endorsed having generalized weakness but no focal weakness  Hematological: Denies adenopathy. Easy bruising, personal or family bleeding history  Psychiatric/Behavioral: Denies suicidal ideation, mood changes, confusion, nervousness, sleep disturbance and agitation  Past Medical History: Past Medical History  Diagnosis Date  . MI (myocardial infarction)   . Hypertension   . Diabetes mellitus   . Hypercholesterolemia   . GERD (gastroesophageal reflux disease)    Past Surgical History  Procedure Date  . Hip surgery   . Tonsillectomy     Medications: Prior to Admission medications   Medication Sig Start Date End Date Taking? Authorizing Provider  aspirin EC 81 MG tablet Take 162 mg by mouth daily.   Yes Historical Provider, MD  atorvastatin (LIPITOR) 40 MG tablet Take 40 mg by mouth daily.   Yes Historical Provider, MD  Calcium Carbonate-Vitamin D (CALCIUM-VITAMIN D) 500-200 MG-UNIT per tablet Take 1 tablet by mouth 2 (two) times daily with a meal.   Yes Historical Provider, MD  cycloSPORINE (RESTASIS) 0.05 % ophthalmic emulsion Place 2 drops into both eyes 2 (two) times daily.   Yes Historical Provider, MD  docusate sodium (COLACE) 100 MG capsule Take 100 mg by mouth 2 (two) times daily.   Yes Historical Provider, MD  fish oil-omega-3 fatty acids 1000 MG capsule Take 1 g by mouth 3 (three) times daily.   Yes Historical Provider, MD  glimepiride (AMARYL) 4 MG tablet Take 4 mg by mouth daily before breakfast.   Yes Historical Provider, MD  isosorbide mononitrate (IMDUR) 30 MG 24 hr tablet  Take 30 mg by mouth daily.   Yes Historical Provider, MD  metFORMIN (GLUCOPHAGE) 500 MG tablet Take 500 mg by mouth 4  (four) times daily.   Yes Historical Provider, MD  Multiple Vitamins-Minerals (MULTIVITAMIN WITH MINERALS) tablet Take 1 tablet by mouth daily.   Yes Historical Provider, MD  Multiple Vitamins-Minerals (PRESERVISION/LUTEIN) CAPS Take 1 capsule by mouth 2 (two) times daily.   Yes Historical Provider, MD  naproxen sodium (ANAPROX) 220 MG tablet Take 440 mg by mouth daily.   Yes Historical Provider, MD  Omeprazole (PRILOSEC PO) Take 1 capsule by mouth daily.   Yes Historical Provider, MD  pioglitazone (ACTOS) 30 MG tablet Take 30 mg by mouth 3 (three) times daily.   Yes Historical Provider, MD  psyllium (METAMUCIL) 58.6 % powder Take 1 packet by mouth daily.   Yes Historical Provider, MD  pyridOXINE (VITAMIN B-6) 100 MG tablet Take 100 mg by mouth 2 (two) times daily.   Yes Historical Provider, MD  sitaGLIPtin (JANUVIA) 100 MG tablet Take 100 mg by mouth daily.   Yes Historical Provider, MD  timolol (BETIMOL) 0.5 % ophthalmic solution Place 1 drop into the left eye daily.   Yes Historical Provider, MD    Allergies:  No Known Allergies  Social History:  reports that he has never smoked. He does not have any smokeless tobacco history on file. He reports that he drinks alcohol. He reports that he does not use illicit drugs.patient lives at home with his wife and mostly functional with his ADLs  Family History: No family history on file.  Physical Exam: Blood pressure 169/84, pulse 73, temperature 97.9 F (36.6 C), temperature source Oral, resp. rate 14, SpO2 100.00%. General: Alert, awake, oriented x3, in no acute distress. HEENT: normocephalic, atraumatic, anicteric sclera, pink conjunctiva, pupils equal and reactive to light and accomodation, oropharynx clear, dry mucosal membranes Neck: supple, no masses or lymphadenopathy, no goiter, no bruits  Heart: Regular rate and rhythm, without murmurs, rubs or gallops. Lungs: Clear to auscultation bilaterally, no wheezing, rales or rhonchi. Abdomen:  Soft, nontender, nondistended, positive bowel sounds, no masses. Extremities: No clubbing, cyanosis or edema with positive pedal pulses. Neuro: Grossly intact, no focal neurological deficits, strength 5/5 upper and lower extremities bilaterally Psych: alert and oriented x 3, normal mood and affect Skin: no rashes or lesions, warm and dry   LABS on Admission:  Basic Metabolic Panel:  Lab 61/60/73 1130 01/31/12 1056  NA 140 139  K 5.8* 5.9*  CL 106 106  CO2 19 18*  GLUCOSE 108* 110*  BUN 68* 69*  CREATININE 5.91* 5.85*  CALCIUM 9.5 9.5  MG -- --  PHOS -- --   Liver Function Tests:  Lab 01/31/12 1130  AST 21  ALT 20  ALKPHOS 67  BILITOT 0.2*  PROT 6.5  ALBUMIN 3.3*   No results found for this basename: LIPASE:2,AMYLASE:2 in the last 168 hours No results found for this basename: AMMONIA:2 in the last 168 hours CBC:  Lab 01/31/12 1130 01/31/12 1056  WBC 8.9 9.2  NEUTROABS 6.7 --  HGB 11.2* 11.2*  HCT 34.5* 34.6*  MCV 93.0 --  PLT 176 175   Cardiac Enzymes: No results found for this basename: CKTOTAL:2,CKMB:2,CKMBINDEX:2,TROPONINI:2 in the last 168 hours BNP: No components found with this basename: POCBNP:2 CBG:  Lab 01/31/12 1502 01/31/12 1145  GLUCAP 157* 109*     Radiological Exams on Admission: Ct Head Wo Contrast  01/31/2012  *RADIOLOGY REPORT*  Clinical Data: Altered mental  status  CT HEAD WITHOUT CONTRAST  Technique:  Contiguous axial images were obtained from the base of the skull through the vertex without contrast.  Comparison: CT scan of the head 03/19/2007, MRI of the brain 03/19/2007  Findings: No acute intracranial hemorrhage, acute infarction, mass, mass lesion, hydrocephalus or midline shift.  The gray-white interface is well defined.  Very mild cerebral volume loss for age which appears unchanged without significant interval progression compared to the prior study.  Stable small lacunar infarct in the left putamen.  Mastoid air cells are  well-aerated.  There are several small polyps versus mucous retention cysts along the medial walls of the bilateral maxillary sinuses. Small air fluid level dependently within the left sphenoid air cell.  The globes intact. No bony calvarial abnormality.  IMPRESSION:  1.  No acute intracranial abnormality. 2.  Mild age related senescent changes without significant interval progression compared to 03/19/2007.  Original Report Authenticated By: Myrle Sheng    Assessment/Plan Present on Admission:  .Acute renal failure: Multifactorial but most likely prerenal due to patient's dehydration, diarrhea, poor oral intake and UTI  - Will admit to telemetry, obtain renal ultrasound to rule out any hydronephrosis or obstruction - Continue IV fluid hydration, urine culture, hold metformin, Naprosyn, urine lites - Renal service has been consulted, will follow medications  Urinary tract infection: - Obtain urine culture, place on Rocephin IV, follow urine cultures and sensitivities   .Altered mental status - Likely secondary to all the above AKI and UTI,   CT head is negative for any CVA  - Will continue to treat the above and assessment for improvement   .HYPERLIPIDEMIA -Obtain lipid panel    .CAD, NATIVE VESSEL:  - Continue aspirin, statins   .Diabetes mellitus: Patient is on 4 oral hypoglycemic agents and is reluctant to be on insulin  - For now, will place him on sliding scale insulin, he should not be on metformin   .GERD (gastroesophageal reflux disease) - Continue PPI   DVT prophylaxis:  -  heparin subcutaneous   Hyperkalemia: Was treated in the ED, recheck potassium  Anemia: Etiology unclear - Will check anemia panel, stool occult test - Diarrhea has resolved.  CODE STATUS:  Full code   Family communication: Patient's wife, Blaize Epple phone [phone]  Further plan will depend as patient's clinical course evolves and further radiologic and laboratory data become available.    Time Spent on Admission: 1 hour   Cing  M.D. [facility] 01/31/2012, 5:20 PM Pager: [phone]  If 7PM-7AM, please contact night-coverage [email or web address] Password [email or web address]

## 2012-01-31 NOTE — ED Provider Notes (Cosign Needed)
History     CSN: [id number]  Arrival date & time 01/31/12  1009   First MD Initiated Contact with Patient 01/31/12 1100      Chief Complaint  Patient presents with  . Altered Mental Status    (Consider location/radiation/quality/duration/timing/severity/associated sxs/prior treatment) HPI Comments: Patient is an 78 year old man who has had intermittent confusion since a couple of nights ago.  He says that all of a sudden he couldn't remember anything.  Yesterday had called Dr. Osborne Casco, who thought it might be a UTI and prescribed Cipro.  Today he was sitting on edge of the bed, confused and incontinent of urine, and therefore was brought to [facility] ED for evaluation.  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  Patient is a 78 y.o. male presenting with altered mental status. The history is provided by the patient and a relative. No language interpreter was used.  Altered Mental Status This is a new problem. The current episode started 2 days ago. Episode frequency: Intermittent episodes of confusion. The problem has not changed since onset.Pertinent negatives include no chest pain, no abdominal pain, no headaches and no shortness of breath. Nothing aggravates the symptoms. Nothing relieves the symptoms. Treatments tried: Pt was started on Cipro yesterday for presumed UTI.      Past Medical History  Diagnosis Date  . MI (myocardial infarction)   . Hypertension   . Diabetes mellitus   . Hypercholesterolemia   . GERD (gastroesophageal reflux disease)     Past Surgical History  Procedure Date  . Hip surgery   . Tonsillectomy     No family history on file.  History  Substance Use Topics  . Smoking status: Never Smoker   . Smokeless tobacco: Not on file  . Alcohol Use: Yes      Review of Systems  Constitutional: Negative.  Negative for fever and chills.  HENT: Negative.   Eyes: Negative.     Respiratory: Negative.  Negative for shortness of breath.   Cardiovascular: Negative for chest pain.  Gastrointestinal: Negative.  Negative for abdominal pain.  Genitourinary: Negative.   Musculoskeletal: Negative.   Skin: Negative.   Neurological: Negative.  Negative for headaches.       Intermittent confusion.  Psychiatric/Behavioral: Positive for altered mental status.    Allergies  Review of patient's allergies indicates no known allergies.  Home Medications   Current Outpatient Rx  Name Route Sig Dispense Refill  . ASPIRIN EC 81 MG PO TBEC Oral Take 162 mg by mouth daily.    . ATORVASTATIN CALCIUM 40 MG PO TABS Oral Take 40 mg by mouth daily.    Marland Kitchen CALCIUM-VITAMIN D 500-200 MG-UNIT PO TABS Oral Take 1 tablet by mouth 2 (two) times daily with a meal.    . CYCLOSPORINE 0.05 % OP EMUL Both Eyes Place 2 drops into both eyes 2 (two) times daily.    Marland Kitchen DOCUSATE SODIUM 100 MG PO CAPS Oral Take 100 mg by mouth 2 (two) times daily.    . OMEGA-3 FATTY ACIDS 1000 MG PO CAPS Oral Take 1 g by mouth 3 (three) times daily.    Marland Kitchen GLIMEPIRIDE 4 MG PO TABS Oral Take 4 mg by mouth daily before breakfast.    . ISOSORBIDE MONONITRATE ER 30 MG PO TB24 Oral Take 30 mg by mouth daily.    Marland Kitchen METFORMIN HCL 500 MG PO TABS Oral Take 500 mg by mouth  4 (four) times daily.    . MULTI-VITAMIN/MINERALS PO TABS Oral Take 1 tablet by mouth daily.    Marland Kitchen PRESERVISION/LUTEIN PO CAPS Oral Take 1 capsule by mouth 2 (two) times daily.    Marland Kitchen NAPROXEN SODIUM 220 MG PO TABS Oral Take 440 mg by mouth daily.    Marland Kitchen PRILOSEC PO Oral Take 1 capsule by mouth daily.    Marland Kitchen PIOGLITAZONE HCL 30 MG PO TABS Oral Take 30 mg by mouth 3 (three) times daily.    . PSYLLIUM 58.6 % PO POWD Oral Take 1 packet by mouth daily.    Marland Kitchen VITAMIN B-6 100 MG PO TABS Oral Take 100 mg by mouth 2 (two) times daily.    Marland Kitchen SITAGLIPTIN PHOSPHATE 100 MG PO TABS Oral Take 100 mg by mouth daily.    Marland Kitchen TIMOLOL HEMIHYDRATE 0.5 % OP SOLN Left Eye Place 1 drop into the  left eye daily.      BP 171/88  Pulse 84  Temp 97.6 F (36.4 C) (Oral)  Resp 16  SpO2 97%  Physical Exam  Nursing note and vitals reviewed. Constitutional: He is oriented to person, place, and time. He appears well-developed and well-nourished. No distress.       Pleasant elderly man in no distress.  HENT:  Head: Normocephalic and atraumatic.  Right Ear: External ear normal.  Left Ear: External ear normal.  Mouth/Throat: Oropharynx is clear and moist.  Eyes: Conjunctivae and EOM are normal. Pupils are equal, round, and reactive to light.  Neck: Normal range of motion. Neck supple.  Cardiovascular: Normal rate, regular rhythm and normal heart sounds.   Pulmonary/Chest: Effort normal and breath sounds normal.  Abdominal: Soft. Bowel sounds are normal.  Musculoskeletal: Normal range of motion. He exhibits no edema and no tenderness.  Neurological: He is alert and oriented to person, place, and time. He has normal reflexes. Abnormal reflex: No sensory or motor deficit.   Skin: Skin is warm and dry.  Psychiatric: He has a normal mood and affect. His behavior is normal.    ED Course  Procedures (including critical care time)  Labs Reviewed  CBC WITH DIFFERENTIAL - Abnormal; Notable for the following:    RBC 3.71 (*)     Hemoglobin 11.2 (*)     HCT 34.6 (*)     All other components within normal limits  BASIC METABOLIC PANEL  URINALYSIS, ROUTINE W REFLEX MICROSCOPIC    11:40 AM Pt seen --> physical exam performed.  Lab workup ordered.  11:56 AM  Date: 01/31/2012  Rate: 74  Rhythm: normal sinus rhythm  QRS Axis: left  Intervals: normal QRS: Poor R wave progression in lateral leads suggests old lateral wall myocardial infarction.  ST/T Wave abnormalities: normal  Conduction Disutrbances:right bundle branch block and left anterior fascicular block  Narrative Interpretation: Abnormal EKG  Old EKG Reviewed: unchanged  1:49 PM Results for orders placed during the  hospital encounter of 01/31/12  CBC WITH DIFFERENTIAL      Component Value Range   WBC 9.2  4.0 - 10.5 K/uL   RBC 3.71 (*) 4.22 - 5.81 MIL/uL   Hemoglobin 11.2 (*) 13.0 - 17.0 g/dL   HCT 34.6 (*) 39.0 - 52.0 %   MCV 93.3  78.0 - 100.0 fL   MCH 30.2  26.0 - 34.0 pg   MCHC 32.4  30.0 - 36.0 g/dL   RDW 14.7  11.5 - 15.5 %   Platelets 175  150 - 400 K/uL  Neutrophils Relative 76  43 - 77 %   Neutro Abs 7.0  1.7 - 7.7 K/uL   Lymphocytes Relative 12  12 - 46 %   Lymphs Abs 1.1  0.7 - 4.0 K/uL   Monocytes Relative 9  3 - 12 %   Monocytes Absolute 0.8  0.1 - 1.0 K/uL   Eosinophils Relative 3  0 - 5 %   Eosinophils Absolute 0.3  0.0 - 0.7 K/uL   Basophils Relative 0  0 - 1 %   Basophils Absolute 0.0  0.0 - 0.1 K/uL  BASIC METABOLIC PANEL      Component Value Range   Sodium 139  135 - 145 mEq/L   Potassium 5.9 (*) 3.5 - 5.1 mEq/L   Chloride 106  96 - 112 mEq/L   CO2 18 (*) 19 - 32 mEq/L   Glucose, Bld 110 (*) 70 - 99 mg/dL   BUN 69 (*) 6 - 23 mg/dL   Creatinine, Ser 0.20 (*) 0.50 - 1.35 mg/dL   Calcium 9.5  8.4 - 79.8 mg/dL   GFR calc non Af Amer 8 (*) >90 mL/min   GFR calc Af Amer 9 (*) >90 mL/min  CBC WITH DIFFERENTIAL      Component Value Range   WBC 8.9  4.0 - 10.5 K/uL   RBC 3.71 (*) 4.22 - 5.81 MIL/uL   Hemoglobin 11.2 (*) 13.0 - 17.0 g/dL   HCT 17.4 (*) 78.6 - 89.7 %   MCV 93.0  78.0 - 100.0 fL   MCH 30.2  26.0 - 34.0 pg   MCHC 32.5  30.0 - 36.0 g/dL   RDW 69.3  00.8 - 89.0 %   Platelets 176  150 - 400 K/uL   Neutrophils Relative 75  43 - 77 %   Neutro Abs 6.7  1.7 - 7.7 K/uL   Lymphocytes Relative 12  12 - 46 %   Lymphs Abs 1.1  0.7 - 4.0 K/uL   Monocytes Relative 10  3 - 12 %   Monocytes Absolute 0.9  0.1 - 1.0 K/uL   Eosinophils Relative 3  0 - 5 %   Eosinophils Absolute 0.3  0.0 - 0.7 K/uL   Basophils Relative 0  0 - 1 %   Basophils Absolute 0.0  0.0 - 0.1 K/uL  COMPREHENSIVE METABOLIC PANEL      Component Value Range   Sodium 140  135 - 145 mEq/L    Potassium 5.8 (*) 3.5 - 5.1 mEq/L   Chloride 106  96 - 112 mEq/L   CO2 19  19 - 32 mEq/L   Glucose, Bld 108 (*) 70 - 99 mg/dL   BUN 68 (*) 6 - 23 mg/dL   Creatinine, Ser 1.38 (*) 0.50 - 1.35 mg/dL   Calcium 9.5  8.4 - 55.2 mg/dL   Total Protein 6.5  6.0 - 8.3 g/dL   Albumin 3.3 (*) 3.5 - 5.2 g/dL   AST 21  0 - 37 U/L   ALT 20  0 - 53 U/L   Alkaline Phosphatase 67  39 - 117 U/L   Total Bilirubin 0.2 (*) 0.3 - 1.2 mg/dL   GFR calc non Af Amer 8 (*) >90 mL/min   GFR calc Af Amer 9 (*) >90 mL/min  URINALYSIS, ROUTINE W REFLEX MICROSCOPIC      Component Value Range   Color, Urine YELLOW  YELLOW   APPearance CLEAR  CLEAR   Specific Gravity, Urine 1.015  1.005 - 1.030  pH 5.5  5.0 - 8.0   Glucose, UA NEGATIVE  NEGATIVE mg/dL   Hgb urine dipstick SMALL (*) NEGATIVE   Bilirubin Urine NEGATIVE  NEGATIVE   Ketones, ur 15 (*) NEGATIVE mg/dL   Protein, ur 30 (*) NEGATIVE mg/dL   Urobilinogen, UA 0.2  0.0 - 1.0 mg/dL   Nitrite NEGATIVE  NEGATIVE   Leukocytes, UA MODERATE (*) NEGATIVE  PROTIME-INR      Component Value Range   Prothrombin Time 14.6  11.6 - 15.2 seconds   INR 1.12  0.00 - 1.49  APTT      Component Value Range   aPTT 33  24 - 37 seconds  SALICYLATE LEVEL      Component Value Range   Salicylate Lvl <2.0 (*) 2.8 - 20.0 mg/dL  ACETAMINOPHEN LEVEL      Component Value Range   Acetaminophen (Tylenol), Serum <15.0  10 - 30 ug/mL  URINE RAPID DRUG SCREEN (HOSP PERFORMED)      Component Value Range   Opiates NONE DETECTED  NONE DETECTED   Cocaine NONE DETECTED  NONE DETECTED   Benzodiazepines NONE DETECTED  NONE DETECTED   Amphetamines NONE DETECTED  NONE DETECTED   Tetrahydrocannabinol NONE DETECTED  NONE DETECTED   Barbiturates NONE DETECTED  NONE DETECTED  ETHANOL      Component Value Range   Alcohol, Ethyl (B) <11  0 - 11 mg/dL  LACTIC ACID, PLASMA      Component Value Range   Lactic Acid, Venous 1.2  0.5 - 2.2 mmol/L  GLUCOSE, CAPILLARY      Component Value Range    Glucose-Capillary 109 (*) 70 - 99 mg/dL  POCT I-STAT TROPONIN I      Component Value Range   Troponin i, poc 0.02  0.00 - 0.08 ng/mL   Comment 3           URINE MICROSCOPIC-ADD ON      Component Value Range   Squamous Epithelial / LPF RARE  RARE   WBC, UA 11-20  <3 WBC/hpf   RBC / HPF 3-6  <3 RBC/hpf   Bacteria, UA FEW (*) RARE   Ct Head Wo Contrast  01/31/2012  *RADIOLOGY REPORT*  Clinical Data: Altered mental status  CT HEAD WITHOUT CONTRAST  Technique:  Contiguous axial images were obtained from the base of the skull through the vertex without contrast.  Comparison: CT scan of the head 03/19/2007, MRI of the brain 03/19/2007  Findings: No acute intracranial hemorrhage, acute infarction, mass, mass lesion, hydrocephalus or midline shift.  The gray-white interface is well defined.  Very mild cerebral volume loss for age which appears unchanged without significant interval progression compared to the prior study.  Stable small lacunar infarct in the left putamen.  Mastoid air cells are well-aerated.  There are several small polyps versus mucous retention cysts along the medial walls of the bilateral maxillary sinuses. Small air fluid level dependently within the left sphenoid air cell.  The globes intact. No bony calvarial abnormality.  IMPRESSION:  1.  No acute intracranial abnormality. 2.  Mild age related senescent changes without significant interval progression compared to 03/19/2007.  Original Report Authenticated By: Vilma Prader    Lab tests showed UTI, renal insufficiency.  Call to Dr. Wylene Simmer --> Dr. Evlyn Kanner, who recommends calling the nephrologists where he has acute renal failure.   2:34 PM Case discussed with Dr. Allena Katz of Nephrology, who suggested Kayexelate and Glucose/insulin to treat his hyperkalemia.  Call to [facility] to  admit him.    1. Altered mental status   2. Renal insufficiency   3. Urinary tract infection   4. Acute renal failure   5. Coronary atherosclerosis of  native coronary artery   6. Diabetes mellitus   7. GERD (gastroesophageal reflux disease)   8. Other and unspecified hyperlipidemia               Mylinda Latina III, MD 02/01/12 1259

## 2012-01-31 NOTE — Progress Notes (Signed)
Victor Wallace, is a 78 y.o. male,   MRN: [id number]  -  DOB - 01-Mar-1925  Outpatient Primary MD for the patient is Haywood Pao, MD   Blood pressure 169/84, pulse 73, temperature 97.9 F (36.6 C), temperature source Oral, resp. rate 14, SpO2 100.00%.  Active Problems:  Acute renal failure  HYPERLIPIDEMIA  CAD, NATIVE VESSEL  Altered mental status   Per Dr. Monia Pouch, this is an 78 yo male who has been experiencing confusion for several days.  Dr. Osborne Casco of [facility] felt it was a UTI and prescribed cipro.  This am he became completely confused and incontinent of urine and was brought to the ED for evaluation.  He was found to have a creatinine of 5+.   Dr. Osborne Casco refused to admit the patient.    Dr. Elmarie Shiley has seen the patient in consultation and feels much of the problem is dehydration.  I have requested a telemetry bed on 6700.   Victor Burn, PA-C [facility] Pager: [phone]

## 2012-01-31 NOTE — Progress Notes (Signed)
I have personally seen and examined this patient and agree with the assessment/plan as outlined above by Victor Costa MD (PGY2). [name] with past medical history significant for diabetes mellitus, hypertension and coronary artery disease who presented with altered mental status-suspected to be from urinary tract infection that recently was started on outpatient antibiotic therapy. His mental status did not improve overnight and so his daughter brought him over to the emergency room for further evaluation and management. In the emergency room, he was noted to have acute renal failure with his creatinine up to 5.9 from a baseline of 1.1. He also had significant hyperkalemia with a potassium of 5.8 that has been medically managed with intravenous insulin, dextrose and oral Kayexalate.  He gives a history of poor oral intake for the past few days however denies any clear orthostatic symptoms. Also voices a history of frequent stools/diarrhea without any hematochezia or melena. Medications are reviewed and it is noted that he was taking a regular nonsteroidal anti-inflammatory drugs-Aleve for hip pain. No contrast exposure and no emerging skin rash or hematuria/foamy urine. Urine analysis that was done in the emergency room shows leukocytosis, mild hematuria with minimal proteinuria. He voices no emerging pulmonary symptoms.  History, timeline of events and available data base appears to be consistent with volume depletion and prerenal azotemia/renal failure however given his advanced age, we'll screen for ANCA vasculitis.  Victor Domingos K.,MD 01/31/2012 5:24 PM

## 2012-01-31 NOTE — ED Notes (Signed)
Family at bedside. 

## 2012-01-31 NOTE — ED Notes (Signed)
Family reports that pt woke up disoriented today. Per family pt has been intermittently  forgetful since yesterday. Pt currently taking antibiotics to treat possible UTI started yesterday. Pt slow to respond to question of current year.

## 2012-02-01 ENCOUNTER — Encounter (HOSPITAL_COMMUNITY): Payer: Self-pay | Admitting: *Deleted

## 2012-02-01 LAB — URINALYSIS, ROUTINE W REFLEX MICROSCOPIC
Bilirubin Urine: NEGATIVE
Glucose, UA: 250 mg/dL — AB
Ketones, ur: NEGATIVE mg/dL
Nitrite: NEGATIVE
Specific Gravity, Urine: 1.014 (ref 1.005–1.030)
pH: 5.5 (ref 5.0–8.0)

## 2012-02-01 LAB — CBC
HCT: 28.4 % — ABNORMAL LOW (ref 39.0–52.0)
MCH: 30.6 pg (ref 26.0–34.0)
MCV: 93.4 fL (ref 78.0–100.0)
Platelets: 149 10*3/uL — ABNORMAL LOW (ref 150–400)
RDW: 14.9 % (ref 11.5–15.5)
WBC: 8.2 10*3/uL (ref 4.0–10.5)

## 2012-02-01 LAB — RENAL FUNCTION PANEL
Albumin: 2.7 g/dL — ABNORMAL LOW (ref 3.5–5.2)
BUN: 62 mg/dL — ABNORMAL HIGH (ref 6–23)
Chloride: 110 mEq/L (ref 96–112)
GFR calc non Af Amer: 9 mL/min — ABNORMAL LOW (ref >90)
Phosphorus: 4.5 mg/dL (ref 2.3–4.6)
Potassium: 4.8 mEq/L (ref 3.5–5.1)
Sodium: 142 mEq/L (ref 135–145)

## 2012-02-01 LAB — GLUCOSE, CAPILLARY
Glucose-Capillary: 217 mg/dL — ABNORMAL HIGH (ref 70–99)
Glucose-Capillary: 185 mg/dL — ABNORMAL HIGH (ref 70–99)

## 2012-02-01 LAB — URINE MICROSCOPIC-ADD ON

## 2012-02-01 LAB — NA AND K (SODIUM & POTASSIUM), RAND UR
Potassium Urine: 41 mEq/L
Potassium Urine: 41 mEq/L
Sodium, Ur: 92 mEq/L

## 2012-02-01 LAB — IRON AND TIBC: Saturation Ratios: 27 % (ref 20–55)

## 2012-02-01 LAB — URINE CULTURE: Colony Count: 15000

## 2012-02-01 LAB — RETICULOCYTES
RBC.: 3.05 MIL/uL — ABNORMAL LOW (ref 4.22–5.81)
Retic Count, Absolute: 18.3 10*3/uL — ABNORMAL LOW (ref 19.0–186.0)
Retic Ct Pct: 0.6 % (ref 0.4–3.1)

## 2012-02-01 MED ORDER — HYDRALAZINE HCL 20 MG/ML IJ SOLN
10.0000 mg | Freq: Once | INTRAMUSCULAR | Status: AC
  Administered 2012-02-01: 10 mg via INTRAVENOUS
  Filled 2012-02-01: qty 0.5

## 2012-02-01 MED ORDER — INSULIN GLARGINE 100 UNIT/ML ~~LOC~~ SOLN: 5.0000 [IU] | Freq: Every day | SUBCUTANEOUS | Status: DC

## 2012-02-01 MED ORDER — HYDROCODONE-ACETAMINOPHEN 5-325 MG PO TABS
1.0000 | ORAL_TABLET | ORAL | Status: DC | PRN
  Administered 2012-02-02: 1 via ORAL
  Filled 2012-02-01 (×2): qty 1

## 2012-02-01 MED ORDER — INSULIN ASPART 100 UNIT/ML ~~LOC~~ SOLN
0.0000 [IU] | Freq: Three times a day (TID) | SUBCUTANEOUS | Status: DC
  Administered 2012-02-02 (×2): 3 [IU] via SUBCUTANEOUS
  Administered 2012-02-02: 5 [IU] via SUBCUTANEOUS
  Administered 2012-02-03: 2 [IU] via SUBCUTANEOUS
  Administered 2012-02-03: 5 [IU] via SUBCUTANEOUS
  Administered 2012-02-03 – 2012-02-04 (×4): 3 [IU] via SUBCUTANEOUS
  Administered 2012-02-05: 2 [IU] via SUBCUTANEOUS
  Administered 2012-02-05: 5 [IU] via SUBCUTANEOUS

## 2012-02-01 NOTE — Progress Notes (Signed)
SLP Cancellation Note    Treatment cancelled today due to patient declines to participate stating he has a headache.  SLP will re-attempt to complete speech/cognitive evaluation at another time.  Thanks.  Luanna Salk, [facility] SLP [phone]

## 2012-02-01 NOTE — Progress Notes (Signed)
Speech Language Pathology  Patient Details Name: Victor Wallace MRN: [id number] DOB: April 10, 1925 Today's Date: 02/01/2012 Time:  -    Attempted cognitive assessment, however MD entered room shortly after this SLP Will return this weekend.  Orbie Pyo [place].Ed [employer] [phone]  02/01/2012

## 2012-02-01 NOTE — Progress Notes (Signed)
Pt has complained of a headache all morning. Tylenol, Aspirin and Norco have been given with no relief. MD notified. Plan to medicate with Dilaudid IV, and/or 2 tabs Norco PO, per MD. No change in neuro status from start of shift this morning. Will continue to monitor.

## 2012-02-01 NOTE — Progress Notes (Signed)
PT NOTE  Pt. Declined PT at this time due to a bad headache.  Room is darkened and he has cold cloth on his head.  He states nurse has given him medicine for his headache.  Will f/u tomorrow.  Gerlean Ren PT [facility] [place] [phone]

## 2012-02-01 NOTE — Progress Notes (Addendum)
Patient ID: Victor Wallace  male  ZOX:[id number]    DOB: 1925/05/15    DOA: 01/31/2012  PCP: Haywood Pao, MD  Subjective: No specific complaints by the patient, patient's wife at the bedside reported that his mental status is close to his baseline. No chest pain, shortness of breath, fevers, chills, nausea or vomiting.    Objective: Weight change:   Intake/Output Summary (Last 24 hours) at 02/01/12 1424 Last data filed at 02/01/12 1200  Gross per 24 hour  Intake 1502.92 ml  Output   1270 ml  Net 232.92 ml   Blood pressure 152/86, pulse 86, temperature 98.3 F (36.8 C), temperature source Oral, resp. rate 18, height $RemoveBe'6\' 2"'JRSGPzLgj$  (1.88 m), weight 79.1 kg (174 lb 6.1 oz), SpO2 98.00%.  Physical Exam: General: Alert and awake, oriented x3, not in any acute distress. HEENT: anicteric sclera, pupils reactive to light and accommodation, EOMI CVS: S1-S2 clear, no murmur rubs or gallops Chest: clear to auscultation bilaterally, no wheezing, rales or rhonchi Abdomen: soft nontender, nondistended, normal bowel sounds, no organomegaly Extremities: no cyanosis, clubbing or edema noted bilaterally Neuro: Cranial nerves II-XII intact, no focal neurological deficits  Lab Results: Basic Metabolic Panel:  Lab 81/19/14 0615 01/31/12 1844  NA 142 142  K 4.8 5.4*  CL 110 108  CO2 21 21  GLUCOSE 231* 179*  BUN 62* 66*  CREATININE 5.43* 5.80*  CALCIUM 8.9 9.1  MG 1.6 --  PHOS 4.5 --   Liver Function Tests:  Lab 02/01/12 0615 01/31/12 1130  AST -- 21  ALT -- 20  ALKPHOS -- 67  BILITOT -- 0.2*  PROT -- 6.5  ALBUMIN 2.7* 3.3*   CBC:  Lab 02/01/12 0615 01/31/12 1130  WBC 8.2 8.9  NEUTROABS -- 6.7  HGB 9.3* 11.2*  HCT 28.4* 34.5*  MCV 93.4 93.0  PLT 149* 176   CBG:  Lab 02/01/12 1140 02/01/12 0803 01/31/12 2110 01/31/12 1502 01/31/12 1145  GLUCAP 201* 217* 201* 157* 109*     Micro Results: Recent Results (from the past 240 hour(s))  URINE CULTURE     Status: Normal   Collection Time   01/31/12 12:09 PM      Component Value Range Status Comment   Specimen Description URINE, CLEAN CATCH   Final    Special Requests NONE   Final    Culture  Setup Time 01/31/2012 13:02   Final    Colony Count 15,000 COLONIES/ML   Final    Culture     Final    Value: Multiple bacterial morphotypes present, none predominant. Suggest appropriate recollection if clinically indicated.   Report Status 02/01/2012 FINAL   Final     Studies/Results: Ct Head Wo Contrast  01/31/2012  *RADIOLOGY REPORT*  Clinical Data: Altered mental status  CT HEAD WITHOUT CONTRAST  Technique:  Contiguous axial images were obtained from the base of the skull through the vertex without contrast.  Comparison: CT scan of the head 03/19/2007, MRI of the brain 03/19/2007  Findings: No acute intracranial hemorrhage, acute infarction, mass, mass lesion, hydrocephalus or midline shift.  The gray-white interface is well defined.  Very mild cerebral volume loss for age which appears unchanged without significant interval progression compared to the prior study.  Stable small lacunar infarct in the left putamen.  Mastoid air cells are well-aerated.  There are several small polyps versus mucous retention cysts along the medial walls of the bilateral maxillary sinuses. Small air fluid level dependently within the left sphenoid air  cell.  The globes intact. No bony calvarial abnormality.  IMPRESSION:  1.  No acute intracranial abnormality. 2.  Mild age related senescent changes without significant interval progression compared to 03/19/2007.  Original Report Authenticated By: Myrle Sheng   US Renal  01/31/2012  *RADIOLOGY REPORT*  Clinical Data: Acute renal insufficiency.  Evaluate for hydronephrosis.  RENAL/URINARY TRACT ULTRASOUND COMPLETE  Comparison:  None.  Findings:  Right Kidney:  10.4 cm. Normal size and echotexture.  No focal abnormality.  No hydronephrosis.  Left Kidney:  10.5 cm.  7.0 cm benign-appearing cyst off the upper  pole.  No hydronephrosis.  Normal echotexture.  Bladder:  Normal for degree distention.  Bilateral ureteral jets visualized.  IMPRESSION: 7 cm benign-appearing left upper pole renal cyst.  No hydronephrosis or acute findings.  Original Report Authenticated By: Raelyn Number, M.D.    Medications: Scheduled Meds:   . sodium chloride   Intravenous Once  . aspirin EC  162 mg Oral Daily  . atorvastatin  40 mg Oral q1800  . calcium-vitamin D  1 tablet Oral BID  . cefTRIAXone (ROCEPHIN)  IV  1 g Intravenous Q24H  . cycloSPORINE  2 drop Both Eyes BID  . dextrose  25 mL Intravenous Once  . heparin  5,000 Units Subcutaneous Q8H  . insulin aspart  0-15 Units Subcutaneous TID WC  . insulin aspart  0-5 Units Subcutaneous QHS  . insulin aspart  10 Units Intravenous Once  . insulin glargine  5 Units Subcutaneous QHS  . isosorbide mononitrate  30 mg Oral Daily  . multivitamin with minerals  1 tablet Oral Daily  . multivitamin-lutein  1 capsule Oral BID  . pantoprazole  40 mg Oral Q1200  . pioglitazone  30 mg Oral QAC breakfast  . pyridOXINE  100 mg Oral BID  . sodium chloride  3 mL Intravenous Q12H  . sodium polystyrene  30 g Oral Once  . timolol  1 drop Left Eye Daily  . DISCONTD: calcium-vitamin D  1 tablet Oral BID  . DISCONTD: cefTRIAXone (ROCEPHIN)  IV  1 g Intravenous Q24H  . DISCONTD: insulin aspart  0-9 Units Subcutaneous TID WC  . DISCONTD: insulin aspart  10 Units Subcutaneous Once  . DISCONTD: multivitamin with minerals  1 tablet Oral Daily  . DISCONTD: PreserVision/Lutein  1 capsule Oral BID  . DISCONTD: timolol  1 drop Left Eye Daily   Continuous Infusions:   . sodium chloride 1,000 mL (01/31/12 2307)  . DISCONTD: sodium chloride 160 mL/hr at 01/31/12 1459     Assessment/Plan: Principal Problem:  * Acute renal failure: Multifactorial but most likely prerenal due to patient's dehydration, diarrhea, poor oral intake and UTI. Baseline BUN 18/creatinine 1.2 on the recent labs  done at Dr. Osborne Casco office on 11/30/2011 - Will admit to telemetry, obtain renal ultrasound to rule out any hydronephrosis or obstruction  - Continue IV fluid hydration, urine culture, hold metformin, Naprosyn, urine lites  - Renal service has been consulted, will follow medications  Urinary tract infection:  - Await urine culture, continue Rocephin IV, follow urine cultures and sensitivities   .Altered mental status: Close to the baseline  - Likely secondary to all the above AKI and UTI, CT head is negative for any CVA  - Will continue to treat the above and assessment for improvement. Speech therapy for cognitive evaluation ordered   .HYPERLIPIDEMIA  -Obtain lipid panel   .CAD, NATIVE VESSEL:  - Continue aspirin, statins   .Diabetes mellitus: Patient is on  4 oral hypoglycemic agents and is reluctant to be on insulin  - For now, will place him on moderate sliding scale insulin, he should not be on metformin  - I also started on Lantus.  Marland KitchenGERD (gastroesophageal reflux disease)  - Continue PPI   DVT prophylaxis:  - heparin subcutaneous   Hyperkalemia: Resolved   Anemia: Etiology unclear  - Will check anemia panel, stool occult test  - Diarrhea has resolved.   CODE STATUS: Full code   Family communication: Patient's wife, Emmanuell Kantz phone [phone], discussed at bedside in detail.   DVT Prophylaxis: Heparin subcutaneous  Code Status: Full code  Disposition: Not medically ready   LOS: 1 day   Brilynn Biasi M.D. [facility] 02/01/2012, 2:24 PM Pager: [phone]  If 7PM-7AM, please contact night-coverage [email or web address] Password [email or web address]

## 2012-02-01 NOTE — Progress Notes (Signed)
Subjective: Patient feels good. Afebrile. No nausea, vomiting or diarrhea. No SOB or chest pain. Has developed a rathr bad headache.  Room darkened.  Says he has trouble "processing" Objective: Vital signs in last 24 hours: Filed Vitals:   01/31/12 1822 01/31/12 1934 02/01/12 0501 02/01/12 0805  BP: 154/77 171/71 138/78 152/86  Pulse: 88 85 84 86  Temp: 97.8 F (36.6 C) 97.7 F (36.5 C) 98.5 F (36.9 C) 98.3 F (36.8 C)  TempSrc: Oral Oral Oral Oral  Resp: $Remo'20 20 18 18  'lNXvK$ Height:  $Remove'6\' 2"'GHKPGZj$  (1.88 m)    Weight:  174 lb 6.1 oz (79.1 kg)    SpO2: 98% 96% 98% 98%   Weight change:   Intake/Output Summary (Last 24 hours) at 02/01/12 1352 Last data filed at 02/01/12 1200  Gross per 24 hour  Intake 1502.92 ml  Output   1270 ml  Net 232.92 ml   Urine: 850  Net net: +412 ml  Body weight:  02/01/12     01/31/12   1934  174 lb 6.1 oz  Physical Exam:   Bp 138/78; HR 84, Tm 98.5 F; RR 18, SpO2 98%.  General: resting in bed, not in acute distress HEENT: PERRL, EOMI, no scleral icterus Cardiac: S1/S2, RRR, No murmurs, gallops or rubs Pulm: Good air movement bilaterally, Clear to auscultation bilaterally, No rales, wheezing, rhonchi or rubs. Abd: Soft,  nondistended, nontender, no rebound pain, no organomegaly, BS present Ext: 1+ pitting edema bilaterally. 2+DP/PT pulse bilaterally Musculoskeletal: No joint deformities, erythema, or stiffness, ROM full and no nontender Skin: no rashes. No skin bruise. Neuro: alert and oriented X3, cranial nerves II-XII grossly intact, muscle strength 5/5 in all extremeties, sensation to light touch intact. 2+ brachial reflex bilaterally. Babinski's sign negative.  Lab Results: Basic Metabolic Panel:  Lab 32/95/18 0615 01/31/12 1844  NA 142 142  K 4.8 5.4*  CL 110 108  CO2 21 21  GLUCOSE 231* 179*  BUN 62* 66*  CREATININE 5.43* 5.80*  CALCIUM 8.9 9.1  MG 1.6 --  PHOS 4.5 --   Liver Function Tests:  Lab 02/01/12 0615 01/31/12 1130  AST  -- 21  ALT -- 20  ALKPHOS -- 67  BILITOT -- 0.2*  PROT -- 6.5  ALBUMIN 2.7* 3.3*   CBC:  Lab 02/01/12 0615 01/31/12 1130 01/31/12 1056  WBC 8.2 8.9 --  NEUTROABS -- 6.7 7.0  HGB 9.3* 11.2* --  HCT 28.4* 34.5* --  MCV 93.4 93.0 --  PLT 149* 176 --   CBG:  Lab 02/01/12 1140 02/01/12 0803 01/31/12 2110 01/31/12 1502 01/31/12 1145  GLUCAP 201* 217* 201* 157* 109*   Coagulation:  Lab 01/31/12 1130  LABPROT 14.6  INR 1.12   Anemia Panel:  Lab 02/01/12 0615  VITAMINB12 208*  FOLATE 16.2  FERRITIN 92  TIBC --  IRON --  RETICCTPCT 0.6   Urine Drug Screen: Drugs of Abuse     Component Value Date/Time   LABOPIA NONE DETECTED 01/31/2012 1209   COCAINSCRNUR NONE DETECTED 01/31/2012 1209   LABBENZ NONE DETECTED 01/31/2012 1209   AMPHETMU NONE DETECTED 01/31/2012 1209   THCU NONE DETECTED 01/31/2012 1209   LABBARB NONE DETECTED 01/31/2012 1209    Alcohol Level:  Lab 01/31/12 1130  ETH <11   Urinalysis:  Lab 02/01/12 0750 01/31/12 1209  COLORURINE YELLOW YELLOW  LABSPEC 1.014 1.015  PHURINE 5.5 5.5  GLUCOSEU 250* NEGATIVE  HGBUR TRACE* SMALL*  BILIRUBINUR NEGATIVE NEGATIVE  KETONESUR NEGATIVE 15*  PROTEINUR NEGATIVE 30*  UROBILINOGEN 0.2 0.2  NITRITE NEGATIVE NEGATIVE  LEUKOCYTESUR TRACE* MODERATE*    Micro Results: Recent Results (from the past 240 hour(s))  URINE CULTURE     Status: Normal   Collection Time   01/31/12 12:09 PM      Component Value Range Status Comment   Specimen Description URINE, CLEAN CATCH   Final    Special Requests NONE   Final    Culture  Setup Time 01/31/2012 13:02   Final    Colony Count 15,000 COLONIES/ML   Final    Culture     Final    Value: Multiple bacterial morphotypes present, none predominant. Suggest appropriate recollection if clinically indicated.   Report Status 02/01/2012 FINAL   Final    Studies/Results: Ct Head Wo Contrast  01/31/2012  *RADIOLOGY REPORT*  Clinical Data: Altered mental status  CT HEAD WITHOUT CONTRAST   Technique:  Contiguous axial images were obtained from the base of the skull through the vertex without contrast.  Comparison: CT scan of the head 03/19/2007, MRI of the brain 03/19/2007  Findings: No acute intracranial hemorrhage, acute infarction, mass, mass lesion, hydrocephalus or midline shift.  The gray-white interface is well defined.  Very mild cerebral volume loss for age which appears unchanged without significant interval progression compared to the prior study.  Stable small lacunar infarct in the left putamen.  Mastoid air cells are well-aerated.  There are several small polyps versus mucous retention cysts along the medial walls of the bilateral maxillary sinuses. Small air fluid level dependently within the left sphenoid air cell.  The globes intact. No bony calvarial abnormality.  IMPRESSION:  1.  No acute intracranial abnormality. 2.  Mild age related senescent changes without significant interval progression compared to 03/19/2007.  Original Report Authenticated By: Myrle Sheng   US Renal  01/31/2012  *RADIOLOGY REPORT*  Clinical Data: Acute renal insufficiency.  Evaluate for hydronephrosis.  RENAL/URINARY TRACT ULTRASOUND COMPLETE  Comparison:  None.  Findings:  Right Kidney:  10.4 cm. Normal size and echotexture.  No focal abnormality.  No hydronephrosis.  Left Kidney:  10.5 cm.  7.0 cm benign-appearing cyst off the upper pole.  No hydronephrosis.  Normal echotexture.  Bladder:  Normal for degree distention.  Bilateral ureteral jets visualized.  IMPRESSION: 7 cm benign-appearing left upper pole renal cyst.  No hydronephrosis or acute findings.  Original Report Authenticated By: Raelyn Number, M.D.   Medications:  Scheduled Meds:    . sodium chloride   Intravenous Once  . aspirin EC  162 mg Oral Daily  . atorvastatin  40 mg Oral q1800  . calcium-vitamin D  1 tablet Oral BID  . cefTRIAXone (ROCEPHIN)  IV  1 g Intravenous Q24H  . cefTRIAXone (ROCEPHIN)  IV  1 g Intravenous Q24H  .  cycloSPORINE  2 drop Both Eyes BID  . dextrose  25 mL Intravenous Once  . heparin  5,000 Units Subcutaneous Q8H  . insulin aspart  0-5 Units Subcutaneous QHS  . insulin aspart  0-9 Units Subcutaneous TID WC  . insulin aspart  10 Units Intravenous Once  . isosorbide mononitrate  30 mg Oral Daily  . multivitamin with minerals  1 tablet Oral Daily  . multivitamin-lutein  1 capsule Oral BID  . pantoprazole  40 mg Oral Q1200  . pioglitazone  30 mg Oral QAC breakfast  . pyridOXINE  100 mg Oral BID  . sodium chloride  3 mL Intravenous Q12H  . sodium polystyrene  30 g Oral Once  . timolol  1 drop Left Eye Daily  . DISCONTD: calcium-vitamin D  1 tablet Oral BID  . DISCONTD: insulin aspart  10 Units Subcutaneous Once  . DISCONTD: multivitamin with minerals  1 tablet Oral Daily  . DISCONTD: PreserVision/Lutein  1 capsule Oral BID  . DISCONTD: timolol  1 drop Left Eye Daily   Continuous Infusions:    . sodium chloride 1,000 mL (01/31/12 2307)  . DISCONTD: sodium chloride 160 mL/hr at 01/31/12 1459   PRN Meds:.acetaminophen, acetaminophen, alum & mag hydroxide-simeth, HYDROcodone-acetaminophen, HYDROmorphone (DILAUDID) injection, ondansetron (ZOFRAN) IV, ondansetron, DISCONTD: HYDROcodone-acetaminophen Assessment/Plan:  # AKI: It is mostly likely due to pre-renal (although FeNa greater than one does not fit). Patient had loose stool and decreased oral intake in past 5-6 days. Currently patient does not seem to be fluid overloaded. He does not have shortness of breath. His oxygen saturation is 100%. His lung auscultation is clear bilaterally. Patient has bilateral lower leg edema, but it is chronic and is not worsening recently per patient. UA is positive for UTI which may also contribute his AKI. patient's renal US did not have hydronephrosis   - Continue IV fluid: NS  - Pending urine culture - Continue IV rocephin for UTI and pending urine culture - Naproxen was discontinued - Pending FeNA   Greater than 1 - pending ANCA  #: AMS: It is likely due to delirium secondary to urinary tract infection and AKI. Patient feels normal currently. Will treat the underlying causes as above.  When I saw - "trouble processing" and headache #: Hyperkalemia: Potassium was 5.8. Patient was treated with IV Novology insulin, 10 U and 15 gram of Kayexalate in ED.  Repeated K 4.8  Magnesium 1.6  #: DM-2: SSI and d/c metformin  #: HTN: Bp is 138/78. Per primary team.  #. Anemia: etiology is not clear.  Pending anemia panel.    LOS: 1 day   Ivor Costa 02/01/2012, 1:52 PM I have seen and examined this patient and agree with plan  As outlined above.  Working dx for AKI is prerenal although urine sodium and FeNa don't fit.  However renal function somewhat improved from admission.  Lungs remain clear.  Would continue IVF for another 24 hours and reassess tomorrow.Jamal Maes B,MD 02/01/2012 2:36 PM

## 2012-02-01 NOTE — Progress Notes (Signed)
Pt complaining of continues headache with elevated BP. Has refused all scheduled and PRN PM meds stating that he just wants to rest, MD(Lynch) aware, orders received. Will continue monitoring

## 2012-02-01 NOTE — Progress Notes (Signed)
Utilization Review Completed.Donne Anon T8/08/2011

## 2012-02-01 NOTE — Evaluation (Signed)
Occupational Therapy Evaluation and Discharge Patient Details Name: Victor Wallace MRN: [id number] DOB: 05/05/1925 Today's Date: 02/01/2012 Time: 5053-9767 OT Time Calculation (min): 41 min  OT Assessment / Plan / Recommendation Clinical Impression  This 78 yo male admitted with confusion and found to have a UTI presents with all OT education completed. Do feel he will benefit from SLP consult for cognition. Axute OT will sign off.    OT Assessment  Patient does not need any further OT services    Follow Up Recommendations  No OT follow up    Barriers to Discharge      Equipment Recommendations  None recommended by OT    Recommendations for Other Services Speech consult (for cognition (inpatient)--if not then outpatient)  Frequency       Precautions / Restrictions Precautions Precautions: Fall Restrictions Weight Bearing Restrictions: No   Pertinent Vitals/Pain headache    ADL  Eating/Feeding: Simulated;Independent Where Assessed - Eating/Feeding: Edge of bed Grooming: Simulated;Supervision/safety Where Assessed - Grooming: Unsupported standing Upper Body Bathing: Simulated;Supervision/safety Where Assessed - Upper Body Bathing: Unsupported sit to stand Lower Body Bathing: Simulated;Supervision/safety Where Assessed - Lower Body Bathing: Unsupported sitting Upper Body Dressing: Simulated;Supervision/safety Where Assessed - Upper Body Dressing: Unsupported sit to stand Lower Body Dressing: Performed;Supervision/safety Where Assessed - Lower Body Dressing: Unsupported sit to stand Toilet Transfer: Simulated;Supervision/safety Toilet Transfer Method: Sit to Loss adjuster, chartered:  (recliner out door and back to bed) Toileting - Clothing Manipulation and Hygiene: Simulated;Independent Equipment Used:  [facility]) Transfers/Ambulation Related to ADLs: Supervision with [facility] (his in room) also holds on with other hand if there is something close by to hold onto (he and  wife report that this is the way he does at home) ADL Comments: Pt only takes sponge baths. Has bad right hip    OT Diagnosis:    OT Problem List:   OT Treatment Interventions:     OT Goals    Visit Information  Last OT Received On: 02/01/12 Assistance Needed: +1    Subjective Data  Subjective: Sometimes I can remember and get out what I want to say and other times I cannot Patient Stated Goal: wants to be able to remember things better   Prior Functioning  Vision/Perception  Home Living Lives With: Spouse Available Help at Discharge: Family;Available 24 hours/day Type of Home: House Home Access: Stairs to enter [employer] of Steps: 2 Entrance Stairs-Rails: None Home Layout: One level Bathroom Shower/Tub: Tub/shower unit;Curtain (only sponge baths) Bathroom Toilet: Handicapped height Bathroom Accessibility: Yes How Accessible: Accessible via walker Home Adaptive Equipment: Straight cane;Walker - four wheeled;Grab bars around toilet Prior Function Level of Independence: Independent with assistive device(s) Able to Take Stairs?: Yes Vocation: Retired Comments: Corporate treasurer) Communication Communication: Expressive difficulties (minimal) Dominant Hand: Right      Cognition  Overall Cognitive Status: Impaired Area of Impairment: Memory Arousal/Alertness: Awake/alert Orientation Level: Appears intact for tasks assessed Behavior During Session: Anxious (could not remember how to access his bank account online) Memory Deficits: Trouble navigating use of his IPad that wife says he uses all the time; however she does report that today it is about 50% better than yesterday.  Cognition - Other Comments: Pt reports that he has noticed some memory and word retrieval issues as of late and of course this has gotten worse with the UTI. 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 While in his room he was set on knowing if his money had come in and since he could not figurre out how to access it on his Ipas his wife had to leave and go to the bank to find out for him so that he could get it off his mind.    Extremity/Trunk Assessment Right Upper Extremity Assessment RUE ROM/Strength/Tone: Within functional levels Left Upper Extremity Assessment LUE ROM/Strength/Tone: Within functional levels   Mobility Bed Mobility Bed Mobility: Sit to Supine;Scooting to HOB Sit to Supine: 7: Independent;HOB flat Scooting to HOB: 7: Independent Transfers Transfers: Sit to Stand;Stand to Sit Sit to Stand: 5: Supervision;With upper extremity assist;With armrests;From chair/3-in-1 Stand to Sit: 5: Supervision;With upper extremity assist;To bed   Exercise    Balance    End of Session OT - End of Session Equipment Utilized During Treatment: Gait belt [facility]) Activity Tolerance: Patient tolerated treatment well Patient left: in bed;with call bell/phone within reach;with bed alarm set       Almon Register [phone] 02/01/2012, 11:06 AM

## 2012-02-02 ENCOUNTER — Inpatient Hospital Stay (HOSPITAL_COMMUNITY): Payer: Medicare Other

## 2012-02-02 LAB — RENAL FUNCTION PANEL
BUN: 58 mg/dL — ABNORMAL HIGH (ref 6–23)
CO2: 14 mEq/L — ABNORMAL LOW (ref 19–32)
CO2: 16 mEq/L — ABNORMAL LOW (ref 19–32)
Calcium: 8.7 mg/dL (ref 8.4–10.5)
Creatinine, Ser: 4.63 mg/dL — ABNORMAL HIGH (ref 0.50–1.35)
GFR calc Af Amer: 12 mL/min — ABNORMAL LOW (ref >90)
Glucose, Bld: 231 mg/dL — ABNORMAL HIGH (ref 70–99)
Glucose, Bld: 230 mg/dL — ABNORMAL HIGH (ref 70–99)
Phosphorus: 5.2 mg/dL — ABNORMAL HIGH (ref 2.3–4.6)
Potassium: 5.5 mEq/L — ABNORMAL HIGH (ref 3.5–5.1)
Sodium: 142 mEq/L (ref 135–145)

## 2012-02-02 LAB — CBC
HCT: 34.2 % — ABNORMAL LOW (ref 39.0–52.0)
Hemoglobin: 11.2 g/dL — ABNORMAL LOW (ref 13.0–17.0)
MCV: 94.5 fL (ref 78.0–100.0)
RBC: 3.62 MIL/uL — ABNORMAL LOW (ref 4.22–5.81)
WBC: 10.7 10*3/uL — ABNORMAL HIGH (ref 4.0–10.5)

## 2012-02-02 LAB — GLUCOSE, CAPILLARY: Glucose-Capillary: 230 mg/dL — ABNORMAL HIGH (ref 70–99)

## 2012-02-02 LAB — URINE CULTURE

## 2012-02-02 LAB — OCCULT BLOOD X 1 CARD TO LAB, STOOL: Fecal Occult Bld: NEGATIVE

## 2012-02-02 MED ORDER — LORATADINE 10 MG PO TABS
10.0000 mg | ORAL_TABLET | Freq: Every day | ORAL | Status: DC
  Administered 2012-02-02 – 2012-02-05 (×4): 10 mg via ORAL
  Filled 2012-02-02 (×4): qty 1

## 2012-02-02 MED ORDER — INSULIN GLARGINE 100 UNIT/ML ~~LOC~~ SOLN
10.0000 [IU] | Freq: Every day | SUBCUTANEOUS | Status: DC
  Administered 2012-02-02 – 2012-02-04 (×3): 10 [IU] via SUBCUTANEOUS

## 2012-02-02 MED ORDER — SODIUM POLYSTYRENE SULFONATE 15 GM/60ML PO SUSP
30.0000 g | Freq: Once | ORAL | Status: DC
  Filled 2012-02-02: qty 120

## 2012-02-02 MED ORDER — FLUTICASONE PROPIONATE 50 MCG/ACT NA SUSP
1.0000 | Freq: Every day | NASAL | Status: DC
  Administered 2012-02-02 – 2012-02-05 (×4): 1 via NASAL
  Filled 2012-02-02: qty 16

## 2012-02-02 MED ORDER — PROSIGHT PO TABS
1.0000 | ORAL_TABLET | Freq: Two times a day (BID) | ORAL | Status: DC
  Administered 2012-02-02 – 2012-02-05 (×7): 1 via ORAL
  Filled 2012-02-02 (×8): qty 1

## 2012-02-02 MED ORDER — TIMOLOL MALEATE 0.5 % OP SOLN
1.0000 [drp] | Freq: Every day | OPHTHALMIC | Status: DC
  Administered 2012-02-02 – 2012-02-05 (×4): 1 [drp] via OPHTHALMIC

## 2012-02-02 NOTE — Evaluation (Signed)
Physical Therapy Evaluation Patient Details Name: Victor Wallace MRN: [id number] DOB: 01-14-1925 Today's Date: 02/02/2012 Time: 3299-2426 PT Time Calculation (min): 20 min  PT Assessment / Plan / Recommendation Clinical Impression  Pt. was admitted with confusion and found to have UTI.  He has decreased safety and decreased independence of mobility and gait and will benefit from PT to address these areas befor DC home with family.    PT Assessment  Patient needs continued PT services    Follow Up Recommendations  Home health PT    Barriers to Discharge None      Equipment Recommendations  None recommended by PT    Recommendations for Other Services     Frequency Min 4X/week    Precautions / Restrictions Precautions Precautions: Fall Restrictions Weight Bearing Restrictions: No   Pertinent Vitals/Pain No pain, no distress      Mobility  Bed Mobility Bed Mobility: Not assessed (pt. up in recliner chair) Transfers Transfers: Sit to Stand;Stand to Sit Sit to Stand: 4: Min assist;With upper extremity assist;From chair/3-in-1 Stand to Sit: 4: Min assist;With upper extremity assist;To chair/3-in-1 Details for Transfer Assistance: Pt. needed min assist for safety in transitional movements Ambulation/Gait Ambulation/Gait Assistance: 3: Mod assist Ambulation Distance (Feet): 8 Feet Assistive device: None Ambulation/Gait Assistance Details: Pt. ambulated 4' forward and 4' backward and was limited due to onset of nause.  Pt. was reaching for his basin due to feeling of nausea.  Gait was unsteady with increased lateral shift .   Gait Pattern: Step-through pattern;Trunk flexed Gait velocity: slowed Stairs: No    Exercises     PT Diagnosis: Difficulty walking;Generalized weakness  PT Problem List: Decreased strength;Decreased activity tolerance;Decreased balance;Decreased mobility;Decreased cognition;Decreased knowledge of use of DME;Decreased safety awareness PT Treatment  Interventions: DME instruction;Gait training;Functional mobility training;Therapeutic activities;Therapeutic exercise;Balance training;Patient/family education   PT Goals Acute Rehab PT Goals PT Goal Formulation: With patient/family Time For Goal Achievement: 02/16/12 Potential to Achieve Goals: Good Pt will go Supine/Side to Sit: Independently PT Goal: Supine/Side to Sit - Progress: Goal set today Pt will go Sit to Supine/Side: Independently PT Goal: Sit to Supine/Side - Progress: Goal set today Pt will go Sit to Stand: with modified independence PT Goal: Sit to Stand - Progress: Goal set today Pt will go Stand to Sit: with modified independence PT Goal: Stand to Sit - Progress: Goal set today Pt will Transfer Bed to Chair/Chair to Bed: with modified independence PT Transfer Goal: Bed to Chair/Chair to Bed - Progress: Goal set today Pt will Ambulate: 51 - 150 feet;with modified independence;with rolling walker PT Goal: Ambulate - Progress: Goal set today  Visit Information  Last PT Received On: 02/02/12 Assistance Needed: +1    Subjective Data  Subjective: I feel queazy Patient Stated Goal: return home and independent   Prior Functioning  Home Living Lives With: Spouse Available Help at Discharge: Family;Available 24 hours/day Type of Home: House Home Access: Stairs to enter [employer] of Steps: 2 Entrance Stairs-Rails: None Home Layout: One level Bathroom Shower/Tub: Product/process development scientist: Handicapped height Bathroom Accessibility: Yes How Accessible: Accessible via walker Home Adaptive Equipment: Straight cane;Walker - four wheeled;Grab bars around toilet Prior Function Level of Independence: Independent with assistive device(s) Able to Take Stairs?: Yes Driving: Yes 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 Dominant Hand: Right    Cognition  Overall Cognitive Status: Impaired Area of  Impairment: Attention Difficult to assess due to: Level of arousal Arousal/Alertness: Lethargic Orientation Level: Appears intact  for tasks assessed Behavior During Session: Lethargic Current Attention Level: Sustained Attention - Other Comments: kept eyes closed most of session, would respond to questions but not actively engaged    Extremity/Trunk Assessment Right Upper Extremity Assessment RUE ROM/Strength/Tone: Within functional levels Left Upper Extremity Assessment LUE ROM/Strength/Tone: Within functional levels Right Lower Extremity Assessment RLE ROM/Strength/Tone: Within functional levels RLE Sensation: WFL - Light Touch Left Lower Extremity Assessment LLE ROM/Strength/Tone: Within functional levels LLE Sensation: WFL - Light Touch Trunk Assessment Trunk Assessment: Normal   Balance Balance Balance Assessed: Yes Static Standing Balance Static Standing - Balance Support: Left upper extremity supported;During functional activity Static Standing - Level of Assistance: 4: Min assist Dynamic Standing Balance Dynamic Standing - Balance Support: Left upper extremity supported;During functional activity Dynamic Standing - Level of Assistance: 4: Min assist  End of Session PT - End of Session Equipment Utilized During Treatment: Gait belt Activity Tolerance: Treatment limited secondary to medical complications (Comment);Other (comment) (limited by lethargy and nausea) Patient left: in chair;with call bell/phone within reach;with family/visitor present Nurse Communication: Mobility status  GP     Ladona Ridgel 02/02/2012, 10:23 AM Gerlean Ren PT Acute Rehab Services [place] [phone]

## 2012-02-02 NOTE — Progress Notes (Signed)
Subjective: Patient has severe headache overnight; No neck pain; Has nausea. Afebrile. No SOB or chest pain. Intermittently confused per nurse. He is clear when I saw patient.  Objective: Vital signs in last 24 hours: Filed Vitals:   02/01/12 1450 02/01/12 1826 02/01/12 2058 02/02/12 0626  BP: 147/66 152/65 179/92 160/89  Pulse: 89 98 78 95  Temp: 97.4 F (36.3 C) 98 F (36.7 C) 97.9 F (36.6 C) 97.5 F (36.4 C)  TempSrc: Oral Oral Oral Oral  Resp: $Remo'18 18 18 20  'VFhWv$ Height:      Weight:   178 lb 9.2 oz (81 kg)   SpO2: 98% 98% 96% 96%   Weight change: 4 lb 3 oz (1.9 kg)  Intake/Output Summary (Last 24 hours) at 02/02/12 0807 Last data filed at 02/02/12 0345  Gross per 24 hour  Intake 3653.75 ml  Output   2045 ml  Net 1608.75 ml   Urine: 850 (8/2) and 2045 (8/3)  Body weight:   02/02/12:  02/01/12   2058 178 lb 9.2 oz  (81 kg) 01/31/12   1934  174 lb 6.1 oz (79.1 kg)  Physical Exam:   Bp 138/78; HR 84, Tm 98.5 F; RR 18, SpO2 98%.  General: resting in bed, not in acute distress HEENT: PERRL, EOMI, no scleral icterus Cardiac: S1/S2, RRR, No murmurs, gallops or rubs Pulm: Good air movement bilaterally, Clear to auscultation bilaterally, No rales, wheezing, rhonchi or rubs. Abd: Soft,  nondistended, nontender, no rebound pain, no organomegaly, BS present Ext: 1+ pitting edema bilaterally. 2+DP/PT pulse bilaterally Musculoskeletal: No joint deformities, erythema, or stiffness, ROM full and no nontender Skin: no rashes. No skin bruise. Neuro: alert and oriented X3, cranial nerves II-XII grossly intact, muscle strength 5/5 in all extremeties, sensation to light touch intact. 2+ brachial reflex bilaterally. Babinski's sign negative.   Lab Results: Basic Metabolic Panel:  Lab 31/54/00 0814 02/02/12 0555 02/01/12 0615  NA 142 142 --  K 4.9 5.5* --  CL 109 109 --  CO2 16* 14* --  GLUCOSE 230* 231* --  BUN 58* 58* --  CREATININE 4.63* 4.72* --  CALCIUM 8.7 8.8 --  MG  -- -- 1.6  PHOS 5.2* 4.9* --   Creatinine, Ser  Date/Time Value Range Status  02/02/2012  8:14 AM 4.63* 0.50 - 1.35 mg/dL Final  02/02/2012  5:55 AM 4.72* 0.50 - 1.35 mg/dL Final  02/01/2012  6:15 AM 5.43* 0.50 - 1.35 mg/dL Final  01/31/2012  6:44 PM 5.80* 0.50 - 1.35 mg/dL Final  01/31/2012 11:30 AM 5.91* 0.50 - 1.35 mg/dL Final  01/31/2012 10:56 AM 5.85* 0.50 - 1.35 mg/dL Final    Liver Function Tests:  Lab 02/01/12 0615 01/31/12 1130  AST -- 21  ALT -- 20  ALKPHOS -- 67  BILITOT -- 0.2*  PROT -- 6.5  ALBUMIN 2.7* 3.3*   CBC:  Lab 02/02/12 0500 02/01/12 0615 01/31/12 1130 01/31/12 1056  WBC 10.7* 8.2 -- --  NEUTROABS -- -- 6.7 7.0  HGB 11.2* 9.3* -- --  HCT 34.2* 28.4* -- --  MCV 94.5 93.4 -- --  PLT 176 149* -- --   CBG:  Lab 02/01/12 2056 02/01/12 1655 02/01/12 1140 02/01/12 0803 01/31/12 2110 01/31/12 1502  GLUCAP 185* 159* 201* 217* 201* 157*   Coagulation:  Lab 01/31/12 1130  LABPROT 14.6  INR 1.12   Anemia Panel:  Lab 02/01/12 0615  VITAMINB12 208*  FOLATE 16.2  FERRITIN 92  TIBC 219  IRON 59  RETICCTPCT 0.6   Urine Drug Screen: Drugs of Abuse     Component Value Date/Time   LABOPIA NONE DETECTED 01/31/2012 1209   COCAINSCRNUR NONE DETECTED 01/31/2012 1209   LABBENZ NONE DETECTED 01/31/2012 1209   AMPHETMU NONE DETECTED 01/31/2012 1209   THCU NONE DETECTED 01/31/2012 1209   LABBARB NONE DETECTED 01/31/2012 1209    Alcohol Level:  Lab 01/31/12 1130  ETH <11   Urinalysis:  Lab 02/01/12 0750 01/31/12 1209  COLORURINE YELLOW YELLOW  LABSPEC 1.014 1.015  PHURINE 5.5 5.5  GLUCOSEU 250* NEGATIVE  HGBUR TRACE* SMALL*  BILIRUBINUR NEGATIVE NEGATIVE  KETONESUR NEGATIVE 15*  PROTEINUR NEGATIVE 30*  UROBILINOGEN 0.2 0.2  NITRITE NEGATIVE NEGATIVE  LEUKOCYTESUR TRACE* MODERATE*    Micro Results: Recent Results (from the past 240 hour(s))  URINE CULTURE     Status: Normal   Collection Time   01/31/12 12:09 PM      Component Value Range Status Comment    Specimen Description URINE, CLEAN CATCH   Final    Special Requests NONE   Final    Culture  Setup Time 01/31/2012 13:02   Final    Colony Count 15,000 COLONIES/ML   Final    Culture     Final    Value: Multiple bacterial morphotypes present, none predominant. Suggest appropriate recollection if clinically indicated.   Report Status 02/01/2012 FINAL   Final    Studies/Results: Ct Head Wo Contrast  01/31/2012  *RADIOLOGY REPORT*  Clinical Data: Altered mental status  CT HEAD WITHOUT CONTRAST  Technique:  Contiguous axial images were obtained from the base of the skull through the vertex without contrast.  Comparison: CT scan of the head 03/19/2007, MRI of the brain 03/19/2007  Findings: No acute intracranial hemorrhage, acute infarction, mass, mass lesion, hydrocephalus or midline shift.  The gray-white interface is well defined.  Very mild cerebral volume loss for age which appears unchanged without significant interval progression compared to the prior study.  Stable small lacunar infarct in the left putamen.  Mastoid air cells are well-aerated.  There are several small polyps versus mucous retention cysts along the medial walls of the bilateral maxillary sinuses. Small air fluid level dependently within the left sphenoid air cell.  The globes intact. No bony calvarial abnormality.  IMPRESSION:  1.  No acute intracranial abnormality. 2.  Mild age related senescent changes without significant interval progression compared to 03/19/2007.  Original Report Authenticated By: Myrle Sheng   US Renal  01/31/2012  *RADIOLOGY REPORT*  Clinical Data: Acute renal insufficiency.  Evaluate for hydronephrosis.  RENAL/URINARY TRACT ULTRASOUND COMPLETE  Comparison:  None.  Findings:  Right Kidney:  10.4 cm. Normal size and echotexture.  No focal abnormality.  No hydronephrosis.  Left Kidney:  10.5 cm.  7.0 cm benign-appearing cyst off the upper pole.  No hydronephrosis.  Normal echotexture.  Bladder:  Normal for degree  distention.  Bilateral ureteral jets visualized.  IMPRESSION: 7 cm benign-appearing left upper pole renal cyst.  No hydronephrosis or acute findings.  Original Report Authenticated By: Raelyn Number, M.D.   Medications:  Scheduled Meds:    . sodium chloride   Intravenous Once  . aspirin EC  162 mg Oral Daily  . atorvastatin  40 mg Oral q1800  . calcium-vitamin D  1 tablet Oral BID  . cefTRIAXone (ROCEPHIN)  IV  1 g Intravenous Q24H  . cycloSPORINE  2 drop Both Eyes BID  . heparin  5,000 Units Subcutaneous Q8H  . hydrALAZINE  10 mg Intravenous Once  . insulin aspart  0-15 Units Subcutaneous TID WC  . insulin aspart  0-5 Units Subcutaneous QHS  . insulin glargine  5 Units Subcutaneous QHS  . isosorbide mononitrate  30 mg Oral Daily  . multivitamin with minerals  1 tablet Oral Daily  . multivitamin-lutein  1 capsule Oral BID  . pantoprazole  40 mg Oral Q1200  . pioglitazone  30 mg Oral QAC breakfast  . pyridOXINE  100 mg Oral BID  . sodium chloride  3 mL Intravenous Q12H  . timolol  1 drop Left Eye Daily  . DISCONTD: cefTRIAXone (ROCEPHIN)  IV  1 g Intravenous Q24H  . DISCONTD: insulin aspart  0-9 Units Subcutaneous TID WC   Continuous Infusions:    . sodium chloride 125 mL/hr at 02/02/12 0749   PRN Meds:.acetaminophen, acetaminophen, alum & mag hydroxide-simeth, HYDROcodone-acetaminophen, HYDROmorphone (DILAUDID) injection, ondansetron (ZOFRAN) IV, ondansetron, DISCONTD: HYDROcodone-acetaminophen Assessment/Plan:  # AKI: It is mostly likely due to pre-renal (although FeNa greater than one does not fit). Patient had loose stool and decreased oral intake in past 5-6 days. Currently patient does not seem to be fluid overloaded. He does not have shortness of breath. His oxygen saturation is 100%. His lung auscultation is clear bilaterally. Patient has bilateral lower leg edema, but it is chronic and is not worsening recently per patient. UA is positive for UTI which may also contribute  his AKI. patient's renal US did not have hydronephrosis. Patient had good urine output yesterday (2045 ml).  Urine culture showed 15,000 COLONIES/ML.   - Pending renal function creatinine down to 4.63 today; non-oliguric with 2 liters out yesterday - Continue IV fluid: NS  - Continue IV rocephin for UTI - Naproxen was discontinued - Pending FeNA  Greater than 1 - pending ANCA - add small dose sodium bicarb for metabolic acidosis pending renal recovery  #: AMS: It is likely due to delirium secondary to urinary tract infection and AKI.  Will treat the underlying causes as above.   #: Hyperkalemia: Potassium was 5.8. Patient was treated with IV Novology insulin, 10 U and 15 gram of Kayexalate in ED.  Repeated K 4.8 (8/2);  Magnesium 1.6 (8/2)   #: DM-2: SSI and d/c metformin  #: HTN: Bp is 160/89. Per primary team. Refusing some meds due to HA  #. Anemia: etiology is not clear.  Pending anemia panel.  # headache:  Unclear etiology. No focal neurologic signs currently. There is no neck stiffness.  Per primary team.  HA is severe and limiting, refusing meds and therapies because of this; no meningeal signs and CT not revealing; does he need MR/LP? (headaches NOT usual for him)    LOS: 2 days   Ivor Costa 02/02/2012, 8:07 AM

## 2012-02-02 NOTE — Progress Notes (Signed)
Pt  Has  Nausea  No  Emesis   Only  Dry  Heaves     Refusing    Nausea   Med

## 2012-02-02 NOTE — Progress Notes (Signed)
Patient ID: Victor Wallace  male  [id number]:[id number]    DOB: Feb 10, 1925    DOA: 01/31/2012  PCP: Haywood Pao, MD  Subjective: Headache last night however improving this a.m., patient is alert and oriented x3 at the time of my encounter, appropriately conversing with no slurring of speech. No neck stiffness, fevers or any meningismus signs.   Objective: Weight change: 1.9 kg (4 lb 3 oz)  Intake/Output Summary (Last 24 hours) at 02/02/12 1132 Last data filed at 02/02/12 0905  Gross per 24 hour  Intake 3413.75 ml  Output   2025 ml  Net 1388.75 ml   Blood pressure 156/79, pulse 91, temperature 97.6 F (36.4 C), temperature source Oral, resp. rate 19, height $RemoveBe'6\' 2"'[id number]$  (1.88 m), weight 81 kg (178 lb 9.2 oz), SpO2 96.00%.  Physical Exam: General: Alert and awake, oriented x3, not in any acute distress, no dysarthria, no neck stiffness. HEENT: anicteric sclera, pupils reactive to light and accommodation, EOMI CVS: S1-S2 clear, no murmur rubs or gallops Chest: clear to auscultation bilaterally, no wheezing, rales or rhonchi Abdomen: soft nontender, nondistended, normal bowel sounds, no organomegaly Extremities: no cyanosis, clubbing or 1+ edema noted bilaterally Neuro: Cranial nerves II-XII intact, no focal neurological deficits  Lab Results: Basic Metabolic Panel:  Lab 27/03/50 0814 02/02/12 0555 02/01/12 0615  NA 142 142 --  K 4.9 5.5* --  CL 109 109 --  CO2 16* 14* --  GLUCOSE 230* 231* --  BUN 58* 58* --  CREATININE 4.63* 4.72* --  CALCIUM 8.7 8.8 --  MG -- -- 1.6  PHOS 5.2* -- --   Liver Function Tests:  Lab 02/02/12 0814 02/02/12 0555 01/31/12 1130  AST -- -- 21  ALT -- -- 20  ALKPHOS -- -- 67  BILITOT -- -- 0.2*  PROT -- -- 6.5  ALBUMIN 3.0* 3.1* --   CBC:  Lab 02/02/12 0500 02/01/12 0615 01/31/12 1130  WBC 10.7* 8.2 --  NEUTROABS -- -- 6.7  HGB 11.2* 9.3* --  HCT 34.2* 28.4* --  MCV 94.5 93.4 --  PLT 176 149* --   CBG:  Lab 02/02/12 0822 02/01/12 2056  02/01/12 1655 02/01/12 1140 02/01/12 0803  GLUCAP 208* 185* 159* 201* 217*     Micro Results: Recent Results (from the past 240 hour(s))  URINE CULTURE     Status: Normal   Collection Time   01/31/12 12:09 PM      Component Value Range Status Comment   Specimen Description URINE, CLEAN CATCH   Final    Special Requests NONE   Final    Culture  Setup Time 01/31/2012 13:02   Final    Colony Count 15,000 COLONIES/ML   Final    Culture     Final    Value: Multiple bacterial morphotypes present, none predominant. Suggest appropriate recollection if clinically indicated.   Report Status 02/01/2012 FINAL   Final     Studies/Results: Ct Head Wo Contrast  01/31/2012  *RADIOLOGY REPORT*  Clinical Data: Altered mental status  CT HEAD WITHOUT CONTRAST  Technique:  Contiguous axial images were obtained from the base of the skull through the vertex without contrast.  Comparison: CT scan of the head 03/19/2007, MRI of the brain 03/19/2007  Findings: No acute intracranial hemorrhage, acute infarction, mass, mass lesion, hydrocephalus or midline shift.  The gray-white interface is well defined.  Very mild cerebral volume loss for age which appears unchanged without significant interval progression compared to the prior study.  Stable small  lacunar infarct in the left putamen.  Mastoid air cells are well-aerated.  There are several small polyps versus mucous retention cysts along the medial walls of the bilateral maxillary sinuses. Small air fluid level dependently within the left sphenoid air cell.  The globes intact. No bony calvarial abnormality.  IMPRESSION:  1.  No acute intracranial abnormality. 2.  Mild age related senescent changes without significant interval progression compared to 03/19/2007.  Original Report Authenticated By: Myrle Sheng   US Renal  01/31/2012  *RADIOLOGY REPORT*  Clinical Data: Acute renal insufficiency.  Evaluate for hydronephrosis.  RENAL/URINARY TRACT ULTRASOUND COMPLETE  Comparison:   None.  Findings:  Right Kidney:  10.4 cm. Normal size and echotexture.  No focal abnormality.  No hydronephrosis.  Left Kidney:  10.5 cm.  7.0 cm benign-appearing cyst off the upper pole.  No hydronephrosis.  Normal echotexture.  Bladder:  Normal for degree distention.  Bilateral ureteral jets visualized.  IMPRESSION: 7 cm benign-appearing left upper pole renal cyst.  No hydronephrosis or acute findings.  Original Report Authenticated By: Raelyn Number, M.D.    Medications: Scheduled Meds:    . sodium chloride   Intravenous Once  . aspirin EC  162 mg Oral Daily  . atorvastatin  40 mg Oral q1800  . calcium-vitamin D  1 tablet Oral BID  . cefTRIAXone (ROCEPHIN)  IV  1 g Intravenous Q24H  . cycloSPORINE  2 drop Both Eyes BID  . fluticasone  1 spray Each Nare Daily  . heparin  5,000 Units Subcutaneous Q8H  . hydrALAZINE  10 mg Intravenous Once  . insulin aspart  0-15 Units Subcutaneous TID WC  . insulin aspart  0-5 Units Subcutaneous QHS  . insulin glargine  5 Units Subcutaneous QHS  . isosorbide mononitrate  30 mg Oral Daily  . loratadine  10 mg Oral Daily  . multivitamin  1 tablet Oral BID  . multivitamin with minerals  1 tablet Oral Daily  . pantoprazole  40 mg Oral Q1200  . pioglitazone  30 mg Oral QAC breakfast  . pyridOXINE  100 mg Oral BID  . sodium chloride  3 mL Intravenous Q12H  . sodium polystyrene  30 g Oral Once  . timolol  1 drop Left Eye Daily  . DISCONTD: cefTRIAXone (ROCEPHIN)  IV  1 g Intravenous Q24H  . DISCONTD: insulin aspart  0-9 Units Subcutaneous TID WC  . DISCONTD: multivitamin-lutein  1 capsule Oral BID  . DISCONTD: timolol  1 drop Left Eye Daily   Continuous Infusions:    . sodium chloride 75 mL/hr at 02/02/12 1111     Assessment/Plan: Principal Problem:  * Acute renal failure: Multifactorial but most likely prerenal due to patient's dehydration, diarrhea, poor oral intake and UTI. Baseline BUN 18/creatinine 1.2 on the recent labs done at Dr. Osborne Casco  office on 11/30/2011. Improving -  Continue IV fluid hydration (decreased rate to 75 cc an hour as patient has some crackles in the right base) - continue to hold metformin, Naprosyn, urine lites  - Renal service following, renal ultrasound done, no hydronephrosis or acute findings. Urinary tract infection:  -Urine culture showed only 15,000 colonies, continue Rocephin IV  Headache: No neck stiffness, fevers or leukocytosis or any meningismus signs, no slurring of speech or any focal neurological deficits - CT head was done which showed no intracranial abnormality. Certainly we can do an MRI but hold off on any LP unless indicated or neurological/meningismus signs.   .Altered mental status: Close to the  baseline  - Likely secondary to all the above AKI and UTI, CT head is negative for any CVA  - Consult Speech therapy for cognitive evaluation ordered   .CAD, NATIVE VESSEL:  - Continue aspirin, statins   .Diabetes mellitus: Uncontrolled and patient, hemoglobin A1c 6.3 -  Patient is on 4 oral hypoglycemic agents and is reluctant to be on insulin  - Continue Lantus and moderate sliding scale insulin, he should not be on metformin   .GERD (gastroesophageal reflux disease)  - Continue PPI   DVT prophylaxis:  - heparin subcutaneous   Hyperkalemia: Resolved   Anemia: Improved, appears to be at baseline  CODE STATUS: Full code   Family communication: Discussed with the patient in detail  DVT Prophylaxis: Heparin subcutaneous  Code Status: Full code  Disposition: Not medically ready   LOS: 2 days   Gaddiel Cullens M.D. [facility] 02/02/2012, 11:32 AM Pager: [phone]  If 7PM-7AM, please contact night-coverage [email or web address] Password [email or web address]

## 2012-02-03 LAB — GLUCOSE, CAPILLARY
Glucose-Capillary: 138 mg/dL — ABNORMAL HIGH (ref 70–99)
Glucose-Capillary: 190 mg/dL — ABNORMAL HIGH (ref 70–99)

## 2012-02-03 LAB — BASIC METABOLIC PANEL
CO2: 18 mEq/L — ABNORMAL LOW (ref 19–32)
Chloride: 109 mEq/L (ref 96–112)
Glucose, Bld: 154 mg/dL — ABNORMAL HIGH (ref 70–99)
Potassium: 4.2 mEq/L (ref 3.5–5.1)
Sodium: 139 mEq/L (ref 135–145)

## 2012-02-03 MED ORDER — CYANOCOBALAMIN 1000 MCG/ML IJ SOLN
1000.0000 ug | Freq: Once | INTRAMUSCULAR | Status: AC
  Administered 2012-02-03: 1000 ug via INTRAMUSCULAR
  Filled 2012-02-03: qty 1

## 2012-02-03 NOTE — Progress Notes (Signed)
Patient ID: Victor Wallace  male  YNW:[id number]    DOB: [birth year]    DOA: 01/31/2012  PCP: Haywood Pao, MD  Subjective: Headache improved this a.m,. atient is alert and oriented x3 at the time of my encounter, appropriately conversing with no slurring of speech, at baseline mental status, no nausea vomiting or any diarrhea   Objective: Weight change: 1.8 kg (3 lb 15.5 oz)  Intake/Output Summary (Last 24 hours) at 02/03/12 1122 Last data filed at 02/03/12 0900  Gross per 24 hour  Intake 3357.33 ml  Output    925 ml  Net 2432.33 ml   Blood pressure 146/86, pulse 83, temperature 97.5 F (36.4 C), temperature source Oral, resp. rate 18, height $RemoveBe'6\' 2"'[id number]$  (1.88 m), weight 82.8 kg (182 lb 8.7 oz), SpO2 98.00%.  Physical Exam: General: Alert and awake, oriented x3, not in any acute distress, no dysarthria, no neck stiffness. HEENT: anicteric sclera, pupils reactive to light and accommodation, EOMI CVS: S1-S2 clear, no murmur rubs or gallops Chest: clear to auscultation bilaterally, no wheezing, rales or rhonchi Abdomen: soft nontender, nondistended, normal bowel sounds, no organomegaly Extremities: no cyanosis, clubbing or 1+ edema noted bilaterally Neuro: Cranial nerves II-XII intact, no focal neurological deficits  Lab Results: Basic Metabolic Panel:  Lab 65/78/46 0605 02/02/12 0814 02/01/12 0615  NA 139 142 --  K 4.2 4.9 --  CL 109 109 --  CO2 18* 16* --  GLUCOSE 154* 230* --  BUN 53* 58* --  CREATININE 4.61* 4.63* --  CALCIUM 8.6 8.7 --  MG -- -- 1.6  PHOS -- 5.2* --   Liver Function Tests:  Lab 02/02/12 0814 02/02/12 0555 01/31/12 1130  AST -- -- 21  ALT -- -- 20  ALKPHOS -- -- 67  BILITOT -- -- 0.2*  PROT -- -- 6.5  ALBUMIN 3.0* 3.1* --   CBC:  Lab 02/02/12 0500 02/01/12 0615 01/31/12 1130  WBC 10.7* 8.2 --  NEUTROABS -- -- 6.7  HGB 11.2* 9.3* --  HCT 34.2* 28.4* --  MCV 94.5 93.4 --  PLT 176 149* --   CBG:  Lab 02/03/12 0800 02/02/12 2105 02/02/12  1711 02/02/12 1150 02/02/12 0822  GLUCAP 138* 230* 199* 172* 208*     Micro Results: Recent Results (from the past 240 hour(s))  URINE CULTURE     Status: Normal   Collection Time   01/31/12 12:09 PM      Component Value Range Status Comment   Specimen Description URINE, CLEAN CATCH   Final    Special Requests NONE   Final    Culture  Setup Time 01/31/2012 13:02   Final    Colony Count 15,000 COLONIES/ML   Final    Culture     Final    Value: Multiple bacterial morphotypes present, none predominant. Suggest appropriate recollection if clinically indicated.   Report Status 02/01/2012 FINAL   Final   URINE CULTURE     Status: Normal   Collection Time   02/01/12 12:19 PM      Component Value Range Status Comment   Specimen Description URINE, CLEAN CATCH   Final    Special Requests NONE   Final    Culture  Setup Time 02/01/2012 12:44   Final    Colony Count NO GROWTH   Final    Culture NO GROWTH   Final    Report Status 02/02/2012 FINAL   Final     Studies/Results: Ct Head Wo Contrast  01/31/2012  *RADIOLOGY  REPORT*  Clinical Data: Altered mental status  CT HEAD WITHOUT CONTRAST  Technique:  Contiguous axial images were obtained from the base of the skull through the vertex without contrast.  Comparison: CT scan of the head 03/19/2007, MRI of the brain 03/19/2007  Findings: No acute intracranial hemorrhage, acute infarction, mass, mass lesion, hydrocephalus or midline shift.  The gray-white interface is well defined.  Very mild cerebral volume loss for age which appears unchanged without significant interval progression compared to the prior study.  Stable small lacunar infarct in the left putamen.  Mastoid air cells are well-aerated.  There are several small polyps versus mucous retention cysts along the medial walls of the bilateral maxillary sinuses. Small air fluid level dependently within the left sphenoid air cell.  The globes intact. No bony calvarial abnormality.  IMPRESSION:  1.  No  acute intracranial abnormality. 2.  Mild age related senescent changes without significant interval progression compared to 03/19/2007.  Original Report Authenticated By: Myrle Sheng   US Renal  01/31/2012  *RADIOLOGY REPORT*  Clinical Data: Acute renal insufficiency.  Evaluate for hydronephrosis.  RENAL/URINARY TRACT ULTRASOUND COMPLETE  Comparison:  None.  Findings:  Right Kidney:  10.4 cm. Normal size and echotexture.  No focal abnormality.  No hydronephrosis.  Left Kidney:  10.5 cm.  7.0 cm benign-appearing cyst off the upper pole.  No hydronephrosis.  Normal echotexture.  Bladder:  Normal for degree distention.  Bilateral ureteral jets visualized.  IMPRESSION: 7 cm benign-appearing left upper pole renal cyst.  No hydronephrosis or acute findings.  Original Report Authenticated By: Raelyn Number, M.D.    Medications: Scheduled Meds:    . aspirin EC  162 mg Oral Daily  . atorvastatin  40 mg Oral q1800  . calcium-vitamin D  1 tablet Oral BID  . cefTRIAXone (ROCEPHIN)  IV  1 g Intravenous Q24H  . cycloSPORINE  2 drop Both Eyes BID  . fluticasone  1 spray Each Nare Daily  . heparin  5,000 Units Subcutaneous Q8H  . insulin aspart  0-15 Units Subcutaneous TID WC  . insulin aspart  0-5 Units Subcutaneous QHS  . insulin glargine  10 Units Subcutaneous QHS  . isosorbide mononitrate  30 mg Oral Daily  . loratadine  10 mg Oral Daily  . multivitamin  1 tablet Oral BID  . multivitamin with minerals  1 tablet Oral Daily  . pantoprazole  40 mg Oral Q1200  . pioglitazone  30 mg Oral QAC breakfast  . pyridOXINE  100 mg Oral BID  . sodium chloride  3 mL Intravenous Q12H  . sodium polystyrene  30 g Oral Once  . timolol  1 drop Left Eye Daily  . DISCONTD: insulin glargine  5 Units Subcutaneous QHS   Continuous Infusions:    . sodium chloride 75 mL/hr at 02/03/12 0300     Assessment/Plan: Principal Problem:  * Acute renal failure: Multifactorial but most likely prerenal due to patient's  dehydration, diarrhea, poor oral intake and UTI. Baseline BUN 18/creatinine 1.2 on the recent labs done at Dr. Osborne Casco office on 11/30/2011. Improving slowly -  Continue IV fluid hydration, continue to hold metformin, Naprosyn - Renal service following, renal ultrasound done, no hydronephrosis or acute findings.  Urinary tract infection:  -Urine culture showed only 15,000 colonies, continue Rocephin IV  Headache: Improved, no meningismus signs - CT head and MRI done yesterday was done which showed no intracranial abnormality.  .Altered mental status: Close to the baseline  - Likely secondary to  all the above AKI and UTI, CT head is negative for any CVA  - Consult Speech therapy for cognitive evaluation ordered   .CAD, NATIVE VESSEL:  - Continue aspirin, statins   .Diabetes mellitus: Uncontrolled and patient, hemoglobin A1c 6.3 -  Patient is on 4 oral hypoglycemic agents and is reluctant to be on insulin. Continue Lantus and sliding scale insulin inpatient.    Marland KitchenGERD (gastroesophageal reflux disease)  - Continue PPI   DVT prophylaxis:  - heparin subcutaneous   Hyperkalemia: Resolved   Anemia: Improved, appears to be at baseline  CODE STATUS: Full code   Family communication: Discussed with the patient in detail  DVT Prophylaxis: Heparin subcutaneous  Code Status: Full code  Disposition: Not medically ready   LOS: 3 days   RAI,RIPUDEEP M.D. [facility] 02/03/2012, 11:22 AM Pager: [phone]  If 7PM-7AM, please contact night-coverage [email or web address] Password [email or web address]

## 2012-02-03 NOTE — Progress Notes (Signed)
Nephrology Service Daily Progress Note  Subjective: The patient's headache (noted yesterday) has largely resolved.  MRI showed progressive atrophy, but no acute abnormality.  The patient has been urinating well, and notes having a bowel movement yesterday.  His UOP is lower today than yesterday, but the patient had poor PO intake yesterday due to headache. At the present time he is mentally much clearer than I have seen before and wife acknowledges Still getting IVF at 20 cc/hour States he feels voiding without difficulty  Objective: Vital signs in last 24 hours: Filed Vitals:   02/02/12 1300 02/02/12 1905 02/02/12 2112 02/03/12 0500  BP: 152/92 156/89 178/101 136/64  Pulse: 78 88 90 88  Temp: 98.2 F (36.8 C) 97.8 F (36.6 C) 98 F (36.7 C) 97.9 F (36.6 C)  TempSrc: Oral Oral Oral Oral  Resp: $Remo'18 18 18 18  'SkAEG$ Height:      Weight:   182 lb 8.7 oz (82.8 kg)   SpO2: 100% 100% 100% 98%   Weight change: 3 lb 15.5 oz (1.8 kg)  Intake/Output Summary (Last 24 hours) at 02/03/12 0918 Last data filed at 02/03/12 2122  Gross per 24 hour  Intake 3237.33 ml  Output    775 ml  Net 2462.33 ml   Urine Output: 850 (8/2), 2045 (8/3), 975 (8/4)  Body weight:  02/02/12: 2112 182 lbs 8.7 oz (82.8 kg) 02/01/12   2058 178 lb 9.2 oz  (81 kg) 01/31/12   1934  174 lb 6.1 oz (79.1 kg)  Physical Exam:  General: alert, cooperative, and in no apparent distress HEENT: pupils equal round and reactive to light, vision grossly intact, oropharynx clear and non-erythematous  Neck: supple, no lymphadenopathy, no JVD Lungs: clear to ascultation bilaterally, normal work of respiration, no wheezes, rales, ronchi Heart: regular rate and rhythm, no murmurs, gallops, or rubs Abdomen: soft, non-tender, non-distended, normal bowel sounds Extremities: 1+ pitting edema bilaterally Neurologic: alert & oriented X3, cranial nerves II-XII intact, strength grossly intact, sensation intact to light touch   Lab  Results: Basic Metabolic Panel:  Lab 48/25/00 0605 02/02/12 0814 02/02/12 0555 02/01/12 0615  NA 139 142 -- --  K 4.2 4.9 -- --  CL 109 109 -- --  CO2 18* 16* -- --  GLUCOSE 154* 230* -- --  BUN 53* 58* -- --  CREATININE 4.61* 4.63* -- --  CALCIUM 8.6 8.7 -- --  MG -- -- -- 1.6  PHOS -- 5.2* 4.9* --   Creatinine, Ser  Date/Time Value Range Status  02/03/2012  6:05 AM 4.61* 0.50 - 1.35 mg/dL Final  02/02/2012  8:14 AM 4.63* 0.50 - 1.35 mg/dL Final  02/02/2012  5:55 AM 4.72* 0.50 - 1.35 mg/dL Final  02/01/2012  6:15 AM 5.43* 0.50 - 1.35 mg/dL Final  01/31/2012  6:44 PM 5.80* 0.50 - 1.35 mg/dL Final  01/31/2012 11:30 AM 5.91* 0.50 - 1.35 mg/dL Final    Liver Function Tests:  Lab 02/02/12 0814 02/02/12 0555 01/31/12 1130  AST -- -- 21  ALT -- -- 20  ALKPHOS -- -- 67  BILITOT -- -- 0.2*  PROT -- -- 6.5  ALBUMIN 3.0* 3.1* --   CBC:  Lab 02/02/12 0500 02/01/12 0615 01/31/12 1130 01/31/12 1056  WBC 10.7* 8.2 -- --  NEUTROABS -- -- 6.7 7.0  HGB 11.2* 9.3* -- --  HCT 34.2* 28.4* -- --  MCV 94.5 93.4 -- --  PLT 176 149* -- --   CBG:  Lab 02/03/12 0800 02/02/12 2105  02/02/12 1711 02/02/12 1150 02/02/12 0822 02/01/12 2056  GLUCAP 138* 230* 199* 172* 208* 185*   Coagulation:  Lab 01/31/12 1130  LABPROT 14.6  INR 1.12   Anemia Panel:  Lab 02/01/12 0615  VITAMINB12 208*  FOLATE 16.2  FERRITIN 92  TIBC 219  IRON 59  RETICCTPCT 0.6  Vitamin B12: 208   Urinalysis:  Lab 02/01/12 0750 01/31/12 1209  COLORURINE YELLOW YELLOW  LABSPEC 1.014 1.015  PHURINE 5.5 5.5  GLUCOSEU 250* NEGATIVE  HGBUR TRACE* SMALL*  BILIRUBINUR NEGATIVE NEGATIVE  KETONESUR NEGATIVE 15*  PROTEINUR NEGATIVE 30*  UROBILINOGEN 0.2 0.2  NITRITE NEGATIVE NEGATIVE  LEUKOCYTESUR TRACE* MODERATE*    Micro Results: Recent Results (from the past 240 hour(s))  URINE CULTURE     Status: Normal   Collection Time   01/31/12 12:09 PM      Component Value Range Status Comment   Specimen Description URINE,  CLEAN CATCH   Final    Special Requests NONE   Final    Culture  Setup Time 01/31/2012 13:02   Final    Colony Count 15,000 COLONIES/ML   Final    Culture     Final    Value: Multiple bacterial morphotypes present, none predominant. Suggest appropriate recollection if clinically indicated.   Report Status 02/01/2012 FINAL   Final   URINE CULTURE     Status: Normal   Collection Time   02/01/12 12:19 PM      Component Value Range Status Comment   Specimen Description URINE, CLEAN CATCH   Final    Special Requests NONE   Final    Culture  Setup Time 02/01/2012 12:44   Final    Colony Count NO GROWTH   Final    Culture NO GROWTH   Final    Report Status 02/02/2012 FINAL   Final    Studies/Results: Ct Head Wo Contrast  01/31/2012  *RADIOLOGY REPORT*  Clinical Data: Altered mental status  CT HEAD WITHOUT CONTRAST  Technique:  Contiguous axial images were obtained from the base of the skull through the vertex without contrast.  Comparison: CT scan of the head 03/19/2007, MRI of the brain 03/19/2007  Findings: No acute intracranial hemorrhage, acute infarction, mass, mass lesion, hydrocephalus or midline shift.  The gray-white interface is well defined.  Very mild cerebral volume loss for age which appears unchanged without significant interval progression compared to the prior study.  Stable small lacunar infarct in the left putamen.  Mastoid air cells are well-aerated.  There are several small polyps versus mucous retention cysts along the medial walls of the bilateral maxillary sinuses. Small air fluid level dependently within the left sphenoid air cell.  The globes intact. No bony calvarial abnormality.  IMPRESSION:  1.  No acute intracranial abnormality. 2.  Mild age related senescent changes without significant interval progression compared to 03/19/2007.  Original Report Authenticated By: Myrle Sheng   US Renal  01/31/2012  *RADIOLOGY REPORT*  Clinical Data: Acute renal insufficiency.  Evaluate for  hydronephrosis.  RENAL/URINARY TRACT ULTRASOUND COMPLETE  Comparison:  None.  Findings:  Right Kidney:  10.4 cm. Normal size and echotexture.  No focal abnormality.  No hydronephrosis.  Left Kidney:  10.5 cm.  7.0 cm benign-appearing cyst off the upper pole.  No hydronephrosis.  Normal echotexture.  Bladder:  Normal for degree distention.  Bilateral ureteral jets visualized.  IMPRESSION: 7 cm benign-appearing left upper pole renal cyst.  No hydronephrosis or acute findings.  Original Report Authenticated By: Lennette Bihari  G. Ardeen Garland, M.D.   Medications:  Scheduled Meds:   . aspirin EC  162 mg Oral Daily  . atorvastatin  40 mg Oral q1800  . calcium-vitamin D  1 tablet Oral BID  . cefTRIAXone (ROCEPHIN)  IV  1 g Intravenous Q24H  . cycloSPORINE  2 drop Both Eyes BID  . fluticasone  1 spray Each Nare Daily  . heparin  5,000 Units Subcutaneous Q8H  . insulin aspart  0-15 Units Subcutaneous TID WC  . insulin aspart  0-5 Units Subcutaneous QHS  . insulin glargine  10 Units Subcutaneous QHS  . isosorbide mononitrate  30 mg Oral Daily  . loratadine  10 mg Oral Daily  . multivitamin  1 tablet Oral BID  . multivitamin with minerals  1 tablet Oral Daily  . pantoprazole  40 mg Oral Q1200  . pioglitazone  30 mg Oral QAC breakfast  . pyridOXINE  100 mg Oral BID  . sodium chloride  3 mL Intravenous Q12H  . sodium polystyrene  30 g Oral Once  . timolol  1 drop Left Eye Daily  . DISCONTD: insulin glargine  5 Units Subcutaneous QHS  . DISCONTD: multivitamin-lutein  1 capsule Oral BID  . DISCONTD: timolol  1 drop Left Eye Daily   Continuous Infusions:   . sodium chloride 75 mL/hr at 02/03/12 0300   PRN Meds:.acetaminophen, acetaminophen, alum & mag hydroxide-simeth, HYDROcodone-acetaminophen, HYDROmorphone (DILAUDID) injection, ondansetron (ZOFRAN) IV, ondansetron I have reviewed PRN and scheduled medications   Assessment/Plan:  The patient is an 78 yo man, history of prior MI, HTN, DM2, presenting with  AMS x3 days, found to have AKI likely secondary to possible UTI and decreased PO intake, in setting of NSAID use.  # AKI: The patient's AKI was felt most likely pre-renal at time of initial consultation (although FeNa greater than one does not fit). Patient had loose stool and decreased oral intake prior to admission.  Currently patient does not seem to be fluid overloaded, though he has chronic LE edema. Patient's renal US did not have hydronephrosis.  Patient had good urine output yesterday (975 ml), decreased from the day before likely due to poor PO intake with headache.   UTI may be contributing to his AKI, though Urine culture 8/1 showed 15,000 COLONIES/ML of multiple species, repeat urine culture 8/2 showed no growth (though ceftriaxone started 8/1).  Overall, his creatinine is not decreasing as quickly as I would have expected; we will investigate possible post-renal or intrinsic renal causes. - creatinine appears to have "stalled" in the mid 4's, though this may reflect poor PO intake yesterday - increase IVF to 100 cc/hr - Continue IV rocephin for UTI, started 8/1, plan to treat for 7 days total - FeNA  greater than 1 - pending ANCA - continue sodium bicarb -repeat renal US - patient was volume depleted on presentation, he may have some post-renal obstruction (ie BPH, etc.) now that his is volume repleted -check SPEP/UPEP  #: AMS: It is likely due to delirium secondary to urinary tract infection and AKI.  Will treat the underlying causes as above.   #: Hyperkalemia: Potassium was 5.90 at presentation, but has now normalized.  #: DM-2: SSI   #: HTN: Per primary team.  #. Anemia: Anemia panel shows adequate iron stores, but low vitamin B12 -start B12 supplementation, to continue as an outpatient  # Headache:  Resolved.  MRI showed no abnormalities   LOS: 3 days   Signed, Elnora Morrison, PGY2 Pgr: [phone]  02/03/2012, 9:18 AM I have seen and examined this patient and agree with  plan as outlined by Dr. Elnora Morrison, MD.  Despite IVF hydration and restoration of volume status, and d/c of NSAIDS, creatinine has not dropped as quickly as would expect and has "stalled" in the mid 4's.  Will recheck his ultrasound now that well hydrated, check SPEP and UPEP and serum free light chains; ANCA panel is pending from 8/3 (though no impressive hematuria or proteinuria on 2 urine dipsticks). Ercie Eliasen B,MD 02/03/2012 12:12 PM

## 2012-02-04 ENCOUNTER — Inpatient Hospital Stay (HOSPITAL_COMMUNITY): Payer: Medicare Other

## 2012-02-04 LAB — GLUCOSE, CAPILLARY
Glucose-Capillary: 155 mg/dL — ABNORMAL HIGH (ref 70–99)
Glucose-Capillary: 160 mg/dL — ABNORMAL HIGH (ref 70–99)
Glucose-Capillary: 184 mg/dL — ABNORMAL HIGH (ref 70–99)

## 2012-02-04 LAB — RENAL FUNCTION PANEL
BUN: 48 mg/dL — ABNORMAL HIGH (ref 6–23)
BUN: 47 mg/dL — ABNORMAL HIGH (ref 6–23)
Calcium: 8.7 mg/dL (ref 8.4–10.5)
Chloride: 111 mEq/L (ref 96–112)
Glucose, Bld: 111 mg/dL — ABNORMAL HIGH (ref 70–99)
Glucose, Bld: 206 mg/dL — ABNORMAL HIGH (ref 70–99)
Phosphorus: 3.4 mg/dL (ref 2.3–4.6)
Potassium: 3.7 mEq/L (ref 3.5–5.1)
Potassium: 3.7 mEq/L (ref 3.5–5.1)

## 2012-02-04 LAB — CBC
MCH: 30.8 pg (ref 26.0–34.0)
MCV: 92.6 fL (ref 78.0–100.0)
Platelets: 177 10*3/uL (ref 150–400)
RDW: 14.9 % (ref 11.5–15.5)
WBC: 9.5 10*3/uL (ref 4.0–10.5)

## 2012-02-04 MED ORDER — SODIUM CHLORIDE 0.9 % IV SOLN: INTRAVENOUS | Status: DC

## 2012-02-04 MED ORDER — INSULIN ASPART 100 UNIT/ML ~~LOC~~ SOLN
3.0000 [IU] | Freq: Three times a day (TID) | SUBCUTANEOUS | Status: DC
  Administered 2012-02-04 – 2012-02-05 (×4): 3 [IU] via SUBCUTANEOUS

## 2012-02-04 NOTE — Progress Notes (Signed)
Nephrology Service Daily Progress Note  Subjective:  Patient feels OK. His headache is better. No SOB and Nausea. Had one bowel movement. No fever or chills.  Objective: Vital signs in last 24 hours: Filed Vitals:   02/03/12 1052 02/03/12 1506 02/03/12 1734 02/03/12 2058  BP: 146/86 152/90 165/90 151/76  Pulse: 83 84 80 78  Temp: 97.5 F (36.4 C) 98.3 F (36.8 C) 98.1 F (36.7 C) 97.7 F (36.5 C)  TempSrc: Oral Oral Oral Oral  Resp: $Remo'18 18 17 18  'lQCFl$ Height:      Weight:    187 lb 13.3 oz (85.2 kg)  SpO2: 98% 98% 100% 99%   Weight change: 5 lb 4.7 oz (2.4 kg)  Intake/Output Summary (Last 24 hours) at 02/04/12 0701 Last data filed at 02/04/12 0617  Gross per 24 hour  Intake 2207.5 ml  Output   1220 ml  Net  987.5 ml   Urine Output: 850 (8/2), 2045 (8/3), 975 (8/4), 1220 (8/5)  Body weight:   02/03/12   2058 187 lb 13.3 oz (85.2 kg) 02/02/12:  2112 182 lbs 8.7 oz (82.8 kg) 02/01/12   2058 178 lb 9.2 oz  (81 kg) 01/31/12   1934  174 lb 6.1 oz (79.1 kg)  Physical Exam:  General: alert, cooperative, and in no apparent distress HEENT: pupils equal round and reactive to light, vision grossly intact, oropharynx clear and non-erythematous  Neck: supple, no lymphadenopathy, no JVD Lungs: clear to ascultation bilaterally, normal work of respiration, no wheezes, rales, ronchi Heart: regular rate and rhythm, no murmurs, gallops, or rubs Abdomen: soft, non-tender, non-distended, normal bowel sounds Extremities: 1+ pitting edema bilaterally Neurologic: alert & oriented X3, cranial nerves II-XII intact, strength grossly intact, sensation intact to light touch   Lab Results: Basic Metabolic Panel:  Lab 35/57/32 0605 02/02/12 0814 02/02/12 0555 02/01/12 0615  NA 139 142 -- --  K 4.2 4.9 -- --  CL 109 109 -- --  CO2 18* 16* -- --  GLUCOSE 154* 230* -- --  BUN 53* 58* -- --  CREATININE 4.61* 4.63* -- --  CALCIUM 8.6 8.7 -- --  MG -- -- -- 1.6  PHOS -- 5.2* 4.9* --    Lab  02/03/12 0605 02/02/12 0814 02/02/12 0555 02/01/12 0615 01/31/12 1844  CREATININE 4.61* 4.63* 4.72* 5.43* 5.80*    Liver Function Tests:  Lab 02/02/12 0814 02/02/12 0555 01/31/12 1130  AST -- -- 21  ALT -- -- 20  ALKPHOS -- -- 67  BILITOT -- -- 0.2*  PROT -- -- 6.5  ALBUMIN 3.0* 3.1* --   CBC:  Lab 02/04/12 0620 02/02/12 0500 01/31/12 1130 01/31/12 1056  WBC 9.5 10.7* -- --  NEUTROABS -- -- 6.7 7.0  HGB 10.4* 11.2* -- --  HCT 31.3* 34.2* -- --  MCV 92.6 94.5 -- --  PLT 177 176 -- --   CBG:  Lab 02/03/12 2109 02/03/12 1648 02/03/12 1139 02/03/12 0800 02/02/12 2105 02/02/12 1711  GLUCAP 287* 208* 190* 138* 230* 199*   Coagulation:  Lab 01/31/12 1130  LABPROT 14.6  INR 1.12   Anemia Panel:  Lab 02/01/12 0615  VITAMINB12 208*  FOLATE 16.2  FERRITIN 92  TIBC 219  IRON 59  RETICCTPCT 0.6  Vitamin B12: 208   Urinalysis:  Lab 02/01/12 0750 01/31/12 1209  COLORURINE YELLOW YELLOW  LABSPEC 1.014 1.015  PHURINE 5.5 5.5  GLUCOSEU 250* NEGATIVE  HGBUR TRACE* SMALL*  BILIRUBINUR NEGATIVE NEGATIVE  KETONESUR NEGATIVE 15*  PROTEINUR  NEGATIVE 30*  UROBILINOGEN 0.2 0.2  NITRITE NEGATIVE NEGATIVE  LEUKOCYTESUR TRACE* MODERATE*   Micro Results: Recent Results (from the past 240 hour(s))  URINE CULTURE     Status: Normal   Collection Time   01/31/12 12:09 PM      Component Value Range Status Comment   Specimen Description URINE, CLEAN CATCH   Final    Special Requests NONE   Final    Culture  Setup Time 01/31/2012 13:02   Final    Colony Count 15,000 COLONIES/ML   Final    Culture     Final    Value: Multiple bacterial morphotypes present, none predominant. Suggest appropriate recollection if clinically indicated.   Report Status 02/01/2012 FINAL   Final   URINE CULTURE     Status: Normal   Collection Time   02/01/12 12:19 PM      Component Value Range Status Comment   Specimen Description URINE, CLEAN CATCH   Final    Special Requests NONE   Final    Culture   Setup Time 02/01/2012 12:44   Final    Colony Count NO GROWTH   Final    Culture NO GROWTH   Final    Report Status 02/02/2012 FINAL   Final    Studies/Results: Ct Head Wo Contrast  01/31/2012  *RADIOLOGY REPORT*  Clinical Data: Altered mental status  CT HEAD WITHOUT CONTRAST  Technique:  Contiguous axial images were obtained from the base of the skull through the vertex without contrast.  Comparison: CT scan of the head 03/19/2007, MRI of the brain 03/19/2007  Findings: No acute intracranial hemorrhage, acute infarction, mass, mass lesion, hydrocephalus or midline shift.  The gray-white interface is well defined.  Very mild cerebral volume loss for age which appears unchanged without significant interval progression compared to the prior study.  Stable small lacunar infarct in the left putamen.  Mastoid air cells are well-aerated.  There are several small polyps versus mucous retention cysts along the medial walls of the bilateral maxillary sinuses. Small air fluid level dependently within the left sphenoid air cell.  The globes intact. No bony calvarial abnormality.  IMPRESSION:  1.  No acute intracranial abnormality. 2.  Mild age related senescent changes without significant interval progression compared to 03/19/2007.  Original Report Authenticated By: Myrle Sheng   US Renal  01/31/2012  *RADIOLOGY REPORT*  Clinical Data: Acute renal insufficiency.  Evaluate for hydronephrosis.  RENAL/URINARY TRACT ULTRASOUND COMPLETE  Comparison:  None.  Findings:  Right Kidney:  10.4 cm. Normal size and echotexture.  No focal abnormality.  No hydronephrosis.  Left Kidney:  10.5 cm.  7.0 cm benign-appearing cyst off the upper pole.  No hydronephrosis.  Normal echotexture.  Bladder:  Normal for degree distention.  Bilateral ureteral jets visualized.  IMPRESSION: 7 cm benign-appearing left upper pole renal cyst.  No hydronephrosis or acute findings.  Original Report Authenticated By: Raelyn Number, M.D.   Medications:    Scheduled Meds:    . aspirin EC  162 mg Oral Daily  . atorvastatin  40 mg Oral q1800  . calcium-vitamin D  1 tablet Oral BID  . cefTRIAXone (ROCEPHIN)  IV  1 g Intravenous Q24H  . cyanocobalamin  1,000 mcg Intramuscular Once  . cycloSPORINE  2 drop Both Eyes BID  . fluticasone  1 spray Each Nare Daily  . heparin  5,000 Units Subcutaneous Q8H  . insulin aspart  0-15 Units Subcutaneous TID WC  . insulin aspart  0-5  Units Subcutaneous QHS  . insulin glargine  10 Units Subcutaneous QHS  . isosorbide mononitrate  30 mg Oral Daily  . loratadine  10 mg Oral Daily  . multivitamin  1 tablet Oral BID  . multivitamin with minerals  1 tablet Oral Daily  . pantoprazole  40 mg Oral Q1200  . pioglitazone  30 mg Oral QAC breakfast  . pyridOXINE  100 mg Oral BID  . sodium chloride  3 mL Intravenous Q12H  . sodium polystyrene  30 g Oral Once  . timolol  1 drop Left Eye Daily   Continuous Infusions:    . sodium chloride 75 mL/hr at 02/03/12 1631   PRN Meds:.acetaminophen, acetaminophen, alum & mag hydroxide-simeth, HYDROcodone-acetaminophen, HYDROmorphone (DILAUDID) injection, ondansetron (ZOFRAN) IV, ondansetron I have reviewed PRN and scheduled medications   Assessment/Plan:  The patient is an 77 yo man, history of prior MI, HTN, DM2, presenting with AMS x3 days, found to have AKI likely secondary to possible UTI and decreased PO intake, in setting of NSAID use.  # AKI: The patient's AKI was felt most likely pre-renal at time of initial consultation (although FeNa greater than one does not fit). Patient had loose stool and decreased oral intake prior to admission. Currently patient does not seem to be fluid overloaded, though he has chronic LE edema. Patient's renal US did not have hydronephrosis. Patient had good urine output yesterday (1220 ml).  UTI may be contributing to his AKI, though Urine culture 8/1 showed 15,000 COLONIES/ML of multiple species, repeat urine culture 8/2 showed no  growth (though ceftriaxone started 8/1).  Overall, his creatinine is not decreasing as quickly as expected; we are investigating possible post-renal or intrinsic renal causes.  - Pending renal function panel - pending SPEP/UPEP - pending ANCA - Pending the repeated renal US - patient was volume depleted on presentation, he may have some post-renal obstruction (ie BPH, etc.) now that his is volume repleted - creatinine appears to have "stalled" in the mid 4's, though this may reflect poor PO intake yesterday -  Continue IVF to 75 cc/hr - Continue IV rocephin for UTI, started 8/1, plan to treat for 7 days total - FeNA  greater than 1 - continue sodium bicarb   #: AMS: improving.  It is likely due to delirium secondary to urinary tract infection and AKI.  Will treat the underlying causes as above.   #: Hyperkalemia: Potassium was 5.90 at presentation, but normalized (8/4) - pending renal function panel  #: DM-2: SSI   #: HTN: Per primary team.  #. Anemia: Anemia panel shows adequate iron stores, but low vitamin B12 -start B12 supplementation, to continue as an outpatient  # Headache:  Resolved.  MRI showed no abnormalities   LOS: 4 days   SignedIvor Costa, PGY2 Pgr: [phone] 02/04/2012, 7:01 AM I have seen and examined this patient and agree with plan .  Uo improved and Scr cont to slowly improve.  DC IV fluids.  His pyuria is still unexplained and will check urine for eosinophils.  He is to get up and walk the halls today and if able then he could go in AM from renal standpoint and I can follow as outpt. Hilery Wintle T,MD 02/04/2012 11:51 AM

## 2012-02-04 NOTE — Progress Notes (Signed)
Physical Therapy Evaluation Patient Details Name: Victor Wallace MRN: [id number] DOB: 03-Apr-1925 Today's Date: 02/04/2012 Time: 4332-9518 PT Time Calculation (min): 24 min  PT Assessment / Plan / Recommendation Clinical Impression       PT Assessment       Follow Up Recommendations  Home health PT    Barriers to Discharge        Equipment Recommendations  None recommended by PT    Recommendations for Other Services     Frequency Min 4X/week    Precautions / Restrictions Precautions Precautions: Fall   Pertinent Vitals/Pain 5/10 R hip pain; repsositioned      Mobility  Transfers Transfers: Sit to Stand;Stand to Sit Sit to Stand: 4: Min guard;With upper extremity assist Stand to Sit: 4: Min guard;To chair/3-in-1;With armrests Details for Transfer Assistance: Cues for safe hand placement and control; did not need physical assist Ambulation/Gait Ambulation/Gait Assistance: 4: Min guard (with and without physical contact) Ambulation Distance (Feet): 140 Feet Assistive device: Straight cane;Rolling walker Ambulation/Gait Assistance Details: much improved today; pt prefers to use cane, but is steadier with RW; noted slight hip flexion in uproght stnading and walking Gait Pattern: Step-through pattern    Exercises     PT Diagnosis:    PT Problem List:   PT Treatment Interventions:     PT Goals Acute Rehab PT Goals Time For Goal Achievement: 02/16/12 Potential to Achieve Goals: Good Pt will go Sit to Stand: with modified independence PT Goal: Sit to Stand - Progress: Progressing toward goal Pt will go Stand to Sit: with modified independence PT Goal: Stand to Sit - Progress: Progressing toward goal Pt will Ambulate: 51 - 150 feet;with modified independence;with rolling walker PT Goal: Ambulate - Progress: Progressing toward goal  Visit Information  Last PT Received On: 02/04/12 Assistance Needed: +1    Subjective Data  Subjective: reports is feeling better,  and to walk has him feeling better, too Patient Stated Goal: return home and independent   Prior Functioning  Home Living Lives With: Spouse Available Help at Discharge: Family;Available 24 hours/day Type of Home: House Prior Function Vocation: Retired Dominant Hand: Right    Cognition  Overall Cognitive Status: Appears within functional limits for tasks assessed/performed Arousal/Alertness: Awake/alert Orientation Level: Appears intact for tasks assessed Behavior During Session: [facility] for tasks performed Cognition - Other Comments: Much improved cognition, with no problems staying on subject, problem solving, and no problems navigating iPad    Extremity/Trunk Assessment     Balance    End of Session PT - End of Session Equipment Utilized During Treatment: Gait belt Activity Tolerance: Patient tolerated treatment well Patient left: in chair;with call bell/phone within reach;with family/visitor present Nurse Communication: Mobility status  GP     Quin Hoop [place], [place]  02/04/2012, 11:51 AM

## 2012-02-04 NOTE — Evaluation (Signed)
Speech Language Pathology Evaluation Patient Details Name: Victor Wallace MRN: [id number] DOB: December 30, 1924 Today's Date: 02/04/2012 Time: 2763-9432 SLP Time Calculation (min): 25 min  Problem List:  Patient Active Problem List  Diagnosis  . HYPERLIPIDEMIA  . CAD, NATIVE VESSEL  . Acute renal failure  . Altered mental status  . Diabetes mellitus  . GERD (gastroesophageal reflux disease)  . UTI (lower urinary tract infection)   Past Medical History:  Past Medical History  Diagnosis Date  . MI (myocardial infarction)   . Hypertension   . Diabetes mellitus   . Hypercholesterolemia   . GERD (gastroesophageal reflux disease)    Past Surgical History:  Past Surgical History  Procedure Date  . Hip surgery   . Tonsillectomy    HPI:  78 year old male admitted with acute renal failure: Multifactorial but most likely prerenal due to patient's dehydration, diarrhea, poor oral intake and UTI.   Assessment / Plan / Recommendation Clinical Impression  Cognitive-linguistic status has now returned to baseline and is [facility] for return home with spouse. Educated patient and spouse on need for 24 hour supervision at least initially after d/c as well as close supervision for higher level cognitive tasks  which may impact safety (ie, bills, med management) upon return to home until ensured that patient can complete independently.     SLP Assessment  Patient does not need any further Speech Lanaguage Pathology Services    Follow Up Recommendations  None       Pertinent Vitals/Pain n/a    SLP Evaluation Prior Functioning  Cognitive/Linguistic Baseline: Within functional limits Type of Home: House Lives With: Spouse Available Help at Discharge: Family;Available 24 hours/day Vocation: Retired   Associate Professor  Overall Cognitive Status: Appears within functional limits for tasks assessed    Comprehension  Auditory Comprehension Overall Auditory Comprehension: Appears within functional  limits for tasks assessed Visual Recognition/Discrimination Discrimination: Within Function Limits Reading Comprehension Reading Status: Within funtional limits    Expression Expression Primary Mode of Expression: Verbal Verbal Expression Overall Verbal Expression: Appears within functional limits for tasks assessed Written Expression Dominant Hand: Right Written Expression: Not tested   Oral / Motor Oral Motor/Sensory Function Overall Oral Motor/Sensory Function: Appears within functional limits for tasks assessed Motor Speech Overall Motor Speech: Appears within functional limits for tasks assessed      Gabriel Rainwater Franklin, CCC-SLP [phone]   Brina Umeda Meryl 02/04/2012, 9:51 AM

## 2012-02-04 NOTE — Progress Notes (Signed)
Inpatient Diabetes Program Recommendations  AACE/ADA: New Consensus Statement on Inpatient Glycemic Control (2013)  Target Ranges:  Prepandial:   less than 140 mg/dL      Peak postprandial:   less than 180 mg/dL (1-2 hours)      Critically ill patients:  140 - 180 mg/dL   Reason for Visit: Consult received requesting recommendations for home therapy diabetes meds. Would recommend Levemir (rather than Lantus) at 10 units at HS. ( at low doses, i.e. 10 units, Levemir,  as it does not tend to last as long as the lantus and therefore has less risk of hypoglycemia due to renal clearance. Would also recommend 3 units meal coverage (as ordered here) and to take only if pt eats a full 60 grams carbohydrate (Will order RD consult for both renal and carb restrictions).  Pt will need a correction scale as well, would recommend the sensitive correction scale tid, regardless of po intake at the time. Would not use Amaryl even at low dose, as it is renal excreted.   Would use Tradjenta (Linagliptin) rather than Januvia, as Tradjenta 5 mg is not renal cleared and is glucose dependent.  If continue to use Januvia (Sitagliptin), pt would need a dose adjustment down as it is renal cleared. Would not use Actos nor Metformin with risk of renal failure.  Note: Will talk with patient tomorrow regarding the safety of using insulin over oral sulfonylureas and less risk of hypoglycemia. Thank you, Rosita Kea, RN, CNS, Diabetes Coordinator [phone])

## 2012-02-04 NOTE — Progress Notes (Signed)
Patient ID: Victor Wallace  male  VPX:[id number]    DOB: 1925-05-26    DOA: 01/31/2012  PCP: Haywood Pao, MD  Subjective: Headache improved, eating breakfast. No nausea, vomiting, diarrhea, tolerating diet well.   Objective: Weight change: 2.4 kg (5 lb 4.7 oz)  Intake/Output Summary (Last 24 hours) at 02/04/12 1611 Last data filed at 02/04/12 1450  Gross per 24 hour  Intake 2327.5 ml  Output   1250 ml  Net 1077.5 ml   Blood pressure 148/80, pulse 76, temperature 98.2 F (36.8 C), temperature source Oral, resp. rate 18, height $RemoveBe'6\' 2"'[id number]$  (1.88 m), weight 85.2 kg (187 lb 13.3 oz), SpO2 98.00%.  Physical Exam: General: Alert and awake, oriented x3, not in any acute distress HEENT: anicteric sclera, pupils reactive to light and accommodation, EOMI CVS: S1-S2 clear, no murmur rubs or gallops Chest: clear to auscultation bilaterally, no wheezing, rales or rhonchi Abdomen: soft nontender, nondistended, normal bowel sounds, no organomegaly Extremities: no cyanosis, clubbing or 1+ edema noted bilaterally, L>Rt Neuro: Cranial nerves II-XII intact, no focal neurological deficits  Lab Results: Basic Metabolic Panel:  Lab 46/27/03 1010 02/04/12 0620 02/01/12 0615  NA 139 142 --  K 3.7 3.7 --  CL 108 111 --  CO2 20 21 --  GLUCOSE 206* 111* --  BUN 47* 48* --  CREATININE 4.43* 4.57* --  CALCIUM 8.7 8.8 --  MG -- -- 1.6  PHOS 3.4 -- --   Liver Function Tests:  Lab 02/04/12 1010 02/04/12 0620 01/31/12 1130  AST -- -- 21  ALT -- -- 20  ALKPHOS -- -- 67  BILITOT -- -- 0.2*  PROT -- -- 6.5  ALBUMIN 2.9* 2.8* --   CBC:  Lab 02/04/12 0620 02/02/12 0500 01/31/12 1130  WBC 9.5 10.7* --  NEUTROABS -- -- 6.7  HGB 10.4* 11.2* --  HCT 31.3* 34.2* --  MCV 92.6 94.5 --  PLT 177 176 --   CBG:  Lab 02/04/12 1142 02/04/12 0822 02/03/12 2109 02/03/12 1648 02/03/12 1139  GLUCAP 160* 155* 287* 208* 190*     Micro Results: Recent Results (from the past 240 hour(s))  URINE CULTURE      Status: Normal   Collection Time   01/31/12 12:09 PM      Component Value Range Status Comment   Specimen Description URINE, CLEAN CATCH   Final    Special Requests NONE   Final    Culture  Setup Time 01/31/2012 13:02   Final    Colony Count 15,000 COLONIES/ML   Final    Culture     Final    Value: Multiple bacterial morphotypes present, none predominant. Suggest appropriate recollection if clinically indicated.   Report Status 02/01/2012 FINAL   Final   URINE CULTURE     Status: Normal   Collection Time   02/01/12 12:19 PM      Component Value Range Status Comment   Specimen Description URINE, CLEAN CATCH   Final    Special Requests NONE   Final    Culture  Setup Time 02/01/2012 12:44   Final    Colony Count NO GROWTH   Final    Culture NO GROWTH   Final    Report Status 02/02/2012 FINAL   Final     Studies/Results: Ct Head Wo Contrast  01/31/2012  *RADIOLOGY REPORT*  Clinical Data: Altered mental status  CT HEAD WITHOUT CONTRAST  Technique:  Contiguous axial images were obtained from the base of the skull through  the vertex without contrast.  Comparison: CT scan of the head 03/19/2007, MRI of the brain 03/19/2007  Findings: No acute intracranial hemorrhage, acute infarction, mass, mass lesion, hydrocephalus or midline shift.  The gray-white interface is well defined.  Very mild cerebral volume loss for age which appears unchanged without significant interval progression compared to the prior study.  Stable small lacunar infarct in the left putamen.  Mastoid air cells are well-aerated.  There are several small polyps versus mucous retention cysts along the medial walls of the bilateral maxillary sinuses. Small air fluid level dependently within the left sphenoid air cell.  The globes intact. No bony calvarial abnormality.  IMPRESSION:  1.  No acute intracranial abnormality. 2.  Mild age related senescent changes without significant interval progression compared to 03/19/2007.  Original Report  Authenticated By: Myrle Sheng   US Renal  01/31/2012  *RADIOLOGY REPORT*  Clinical Data: Acute renal insufficiency.  Evaluate for hydronephrosis.  RENAL/URINARY TRACT ULTRASOUND COMPLETE  Comparison:  None.  Findings:  Right Kidney:  10.4 cm. Normal size and echotexture.  No focal abnormality.  No hydronephrosis.  Left Kidney:  10.5 cm.  7.0 cm benign-appearing cyst off the upper pole.  No hydronephrosis.  Normal echotexture.  Bladder:  Normal for degree distention.  Bilateral ureteral jets visualized.  IMPRESSION: 7 cm benign-appearing left upper pole renal cyst.  No hydronephrosis or acute findings.  Original Report Authenticated By: Raelyn Number, M.D.    Medications: Scheduled Meds:    . aspirin EC  162 mg Oral Daily  . atorvastatin  40 mg Oral q1800  . calcium-vitamin D  1 tablet Oral BID  . cefTRIAXone (ROCEPHIN)  IV  1 g Intravenous Q24H  . cycloSPORINE  2 drop Both Eyes BID  . fluticasone  1 spray Each Nare Daily  . heparin  5,000 Units Subcutaneous Q8H  . insulin aspart  0-15 Units Subcutaneous TID WC  . insulin aspart  0-5 Units Subcutaneous QHS  . insulin aspart  3 Units Subcutaneous TID WC  . insulin glargine  10 Units Subcutaneous QHS  . isosorbide mononitrate  30 mg Oral Daily  . loratadine  10 mg Oral Daily  . multivitamin  1 tablet Oral BID  . multivitamin with minerals  1 tablet Oral Daily  . pantoprazole  40 mg Oral Q1200  . pioglitazone  30 mg Oral QAC breakfast  . pyridOXINE  100 mg Oral BID  . sodium chloride  3 mL Intravenous Q12H  . sodium polystyrene  30 g Oral Once  . timolol  1 drop Left Eye Daily   Continuous Infusions:    . DISCONTD: sodium chloride 75 mL/hr at 02/03/12 1631  . DISCONTD: sodium chloride       Assessment/Plan: Principal Problem:  * Acute renal failure: Multifactorial but most likely prerenal due to patient's dehydration, diarrhea, poor oral intake and UTI. Baseline BUN 18/creatinine 1.2 on the recent labs done at Dr. Osborne Casco office on  11/30/2011. Improving slowly -  Continue IV fluid hydration, continue to hold metformin, Naprosyn - Renal service following, renal ultrasound done, no hydronephrosis or acute findings. - Renal Ultrasound was repeated again today however still no obstruction noted, now consistent with "chronic medical renal disease" - IV fluids discontinued by Dr. Mercy Moore, followup with renal outpatient.  Urinary tract infection:  -Urine culture showed only 15,000 colonies, continue Rocephin IV today  Headache: Improved, no meningismus signs - CT head and MRI done yesterday was done which showed no intracranial abnormality.  Marland Kitchen  Altered mental status: Resolved - Likely secondary to all the above AKI and UTI, CT head is negative for any CVA  - Appreciate cognitive evaluation by speech pathology  .CAD, NATIVE VESSEL:  - Continue aspirin, statins   .Diabetes mellitus: Uncontrolled and patient, hemoglobin A1c 6.3 -  Patient is on 4 oral hypoglycemic agents and is reluctant to be on insulin. Continue Lantus and sliding scale insulin inpatient.  - However with the new depressed renal function, patient is at risk of having hypoglycemia multiple oral hypoglycemic agents Placed a diabetic coordinator consult for recommendations for outpatient management. Patient should have an endocrinology referral by his primary care physician, Dr. Osborne Casco.   Marland KitchenGERD (gastroesophageal reflux disease)  - Continue PPI   DVT prophylaxis:  - heparin subcutaneous   Hyperkalemia: Resolved   Anemia: Improved, appears to be at baseline  CODE STATUS: Full code   Family communication: Discussed with the patient and his wife in detail  DVT Prophylaxis: Heparin subcutaneous  Code Status: Full code  Disposition: Patient ambulating with physical therapy, hopefully DC home in a.m.   LOS: 4 days   RAI,RIPUDEEP M.D. [facility] 02/04/2012, 4:11 PM Pager: [phone]  If 7PM-7AM, please contact  night-coverage [email or web address] Password [email or web address]

## 2012-02-05 LAB — RENAL FUNCTION PANEL
CO2: 20 mEq/L (ref 19–32)
Calcium: 8.8 mg/dL (ref 8.4–10.5)
Chloride: 110 mEq/L (ref 96–112)
GFR calc Af Amer: 12 mL/min — ABNORMAL LOW (ref >90)
GFR calc non Af Amer: 10 mL/min — ABNORMAL LOW (ref >90)
Glucose, Bld: 166 mg/dL — ABNORMAL HIGH (ref 70–99)
Potassium: 4.1 mEq/L (ref 3.5–5.1)
Sodium: 142 mEq/L (ref 135–145)

## 2012-02-05 LAB — CBC WITH DIFFERENTIAL/PLATELET
Eosinophils Relative: 3 % (ref 0–5)
Lymphocytes Relative: 13 % (ref 12–46)
Lymphs Abs: 1.2 10*3/uL (ref 0.7–4.0)
MCV: 91.6 fL (ref 78.0–100.0)
Neutro Abs: 7 10*3/uL (ref 1.7–7.7)
Platelets: 155 10*3/uL (ref 150–400)
RBC: 3.11 MIL/uL — ABNORMAL LOW (ref 4.22–5.81)
WBC: 9.3 10*3/uL (ref 4.0–10.5)

## 2012-02-05 LAB — KAPPA/LAMBDA LIGHT CHAINS: Kappa, lambda light chain ratio: 1.7 — ABNORMAL HIGH (ref 0.26–1.65)

## 2012-02-05 LAB — PROTEIN ELECTROPHORESIS, SERUM
Alpha-2-Globulin: 13.3 % — ABNORMAL HIGH (ref 7.1–11.8)
Beta 2: 5.6 % (ref 3.2–6.5)
Beta Globulin: 5.2 % (ref 4.7–7.2)
Gamma Globulin: 11.4 % (ref 11.1–18.8)
M-Spike, %: NOT DETECTED g/dL

## 2012-02-05 LAB — UIFE/LIGHT CHAINS/TP QN, 24-HR UR
Albumin, U: DETECTED
Free Lambda Lt Chains,Ur: 2.37 mg/dL — ABNORMAL HIGH (ref 0.02–0.67)
Gamma Globulin, Urine: DETECTED — AB

## 2012-02-05 MED ORDER — LINAGLIPTIN 5 MG PO TABS: 5.0000 mg | ORAL_TABLET | Freq: Every day | ORAL | Status: DC

## 2012-02-05 MED ORDER — LINAGLIPTIN 5 MG PO TABS
5.0000 mg | ORAL_TABLET | Freq: Every day | ORAL | Status: DC
  Filled 2012-02-05: qty 1

## 2012-02-05 MED ORDER — INSULIN ASPART 100 UNIT/ML ~~LOC~~ SOLN: 3.0000 [IU] | Freq: Three times a day (TID) | SUBCUTANEOUS | Status: DC

## 2012-02-05 MED ORDER — INSULIN DETEMIR 100 UNIT/ML ~~LOC~~ SOLN: 10.0000 [IU] | Freq: Every day | SUBCUTANEOUS | Status: DC

## 2012-02-05 MED ORDER — FLUTICASONE PROPIONATE 50 MCG/ACT NA SUSP: 1.0000 | Freq: Every day | NASAL | Status: DC

## 2012-02-05 MED ORDER — INSULIN ASPART 100 UNIT/ML ~~LOC~~ SOLN: 0.0000 [IU] | Freq: Three times a day (TID) | SUBCUTANEOUS | Status: DC

## 2012-02-05 MED ORDER — DOCUSATE SODIUM 100 MG PO CAPS: 100.0000 mg | ORAL_CAPSULE | Freq: Two times a day (BID) | ORAL | Status: DC | PRN

## 2012-02-05 MED ORDER — HYDROCODONE-ACETAMINOPHEN 5-325 MG PO TABS: 1.0000 | ORAL_TABLET | Freq: Four times a day (QID) | ORAL | Status: AC | PRN

## 2012-02-05 MED ORDER — LORATADINE 10 MG PO TABS: 10.0000 mg | ORAL_TABLET | Freq: Every day | ORAL | Status: DC

## 2012-02-05 MED ORDER — OMEGA-3 FATTY ACIDS 1000 MG PO CAPS: 1.0000 g | ORAL_CAPSULE | Freq: Every day | ORAL | Status: DC

## 2012-02-05 NOTE — Progress Notes (Signed)
Discussed discharge instructions with pt. Pt showed no barriers to discharge. IV removed. Tele removed. Pt discharged to home with wife.

## 2012-02-05 NOTE — Consult Note (Signed)
02-05-12 Demonstrated how to use the insulin pen to patient and wife.  They both returned demonstration quoting the steps to take from removing the cap to counting to ten following the injection. Both verbalized and demonstrated understanding.  Also reviewed hypoglycemia treatment, action of Tradjenta, reason for discontinuance of Januvia, Actos and Amaryl.  Gave them color codes for both the levemir and Novolog. Thank you, Rosita Kea, RN, CNS, Diabetes Coordinator [phone])

## 2012-02-05 NOTE — Plan of Care (Signed)
Problem: Food- and Nutrition-Related Knowledge Deficit (NB-1.1) Goal: Nutrition education Formal process to instruct or train a patient/client in a skill or to impart knowledge to help patients/clients voluntarily manage or modify food choices and eating behavior to maintain or improve health.  Outcome: Completed/Met Date Met:  02/05/12 RD consulted for nutrition education regarding diabetes and CKD.    Lab Results  Component Value Date    HGBA1C 6.3* 02/01/2012    RD provided "Carbohydrate Counting for People with Diabetes" handout from the Academy of [employer] Dietetics. Discussed different food groups and their effects on blood sugar, emphasizing carbohydrate-containing foods. Provided list of carbohydrates and recommended serving sizes of common foods. Discussed importance of controlled and consistent carbohydrate intake throughout the day. Provided examples of ways to balance meals/snacks and encouraged intake of high-fiber, whole grain complex carbohydrates.  RD provided "Low Sodium Nutrition Therapy" handout and Renal Food Pyramid handout. Discussed CKD recommendations and provided written recommendations.  Expect good compliance.  Body mass index is 23.92 kg/(m^2). Pt meets criteria for Normal based on current BMI.  Current diet order is Carbohydrate Modified Medium, patient reports appetite is adequate at this time. Labs and medications reviewed. No further nutrition interventions warranted at this time. If additional nutrition issues arise, please re-consult RD.  Inda Coke MS, RD, LDN Pager: [phone] After-hours pager: [phone]

## 2012-02-05 NOTE — Discharge Summary (Signed)
Physician Discharge Summary  Patient ID: Victor Wallace MRN: [id number] DOB/AGE: Sep 09, 1924 78 y.o.  Admit date: 01/31/2012 Discharge date: 02/05/2012  Primary Care Physician:  Gaspar Garbe, MD  Discharge Diagnoses:    .Acute renal failure, somewhat improved from the admission  .Altered mental status .HYPERLIPIDEMIA .CAD, NATIVE VESSEL .Diabetes mellitus, now on insulin  .GERD (gastroesophageal reflux disease) .UTI (lower urinary tract infection)  Consults:  Renal service, Dr. Briant Cedar                    Diabetic coordinator service  Discharge Medications: Medication List  As of 02/05/2012 12:18 PM   STOP taking these medications         glimepiride 4 MG tablet      metFORMIN 500 MG tablet      naproxen sodium 220 MG tablet      pioglitazone 30 MG tablet      psyllium 58.6 % powder      sitaGLIPtin 100 MG tablet         TAKE these medications         aspirin EC 81 MG tablet   Take 162 mg by mouth daily.      atorvastatin 40 MG tablet   Commonly known as: LIPITOR   Take 40 mg by mouth daily.      calcium-vitamin D 500-200 MG-UNIT per tablet   Take 1 tablet by mouth 2 (two) times daily with a meal.      cycloSPORINE 0.05 % ophthalmic emulsion   Commonly known as: RESTASIS   Place 2 drops into both eyes 2 (two) times daily.      docusate sodium 100 MG capsule   Commonly known as: COLACE   Take 1 capsule (100 mg total) by mouth 2 (two) times daily as needed for constipation.      fish oil-omega-3 fatty acids 1000 MG capsule   Take 1 capsule (1 g total) by mouth daily. Stop taking if having diarrhea      fluticasone 50 MCG/ACT nasal spray   Commonly known as: FLONASE   Place 1 spray into the nose daily. For sinuses/nasal allergies      HYDROcodone-acetaminophen 5-325 MG per tablet   Commonly known as: NORCO/VICODIN   Take 1 tablet by mouth every 6 (six) hours as needed for pain.      insulin aspart 100 UNIT/ML injection   Commonly known as:  novoLOG   Inject 0-15 Units into the skin 3 (three) times daily with meals. Sliding scale correction factor CBG 70 - 120: 0 units: CBG 121 - 150: 2 units; CBG 151 - 200: 3 units; CBG 201 - 250: 5 units; CBG 251 - 300: 8 units;CBG 301 - 350: 11 units; CBG 351 - 400: 15 units; CBG > 400 : 15 units and notify your MD      insulin aspart 100 UNIT/ML injection   Commonly known as: novoLOG   Inject 3 Units into the skin 3 (three) times daily with meals.      insulin detemir 100 UNIT/ML injection   Commonly known as: LEVEMIR   Inject 10 Units into the skin at bedtime. 5 Flex pens if available in pens      isosorbide mononitrate 30 MG 24 hr tablet   Commonly known as: IMDUR   Take 30 mg by mouth daily.      linagliptin 5 MG Tabs tablet   Commonly known as: TRADJENTA   Take 1 tablet (5 mg total) by  mouth daily.      loratadine 10 MG tablet   Commonly known as: CLARITIN   Take 1 tablet (10 mg total) by mouth daily.      PreserVision/Lutein Caps   Take 1 capsule by mouth 2 (two) times daily.      multivitamin with minerals tablet   Take 1 tablet by mouth daily.      PRILOSEC PO   Take 1 capsule by mouth daily.      pyridOXINE 100 MG tablet   Commonly known as: VITAMIN B-6   Take 100 mg by mouth 2 (two) times daily.      timolol 0.5 % ophthalmic solution   Commonly known as: BETIMOL   Place 1 drop into the left eye daily.             Brief H and P: For complete details please refer to admission H and P, but in brief Patient is 78 year old male with history of coronary disease, hypertension, diabetes on 4 oral hypoglycemic agents, hyperlipidemia, GERD presented to ED with confusion and further evaluation. History was obtained from the patient and his wife present in the room. Patient reported that he started initially having diarrhea with 3-4 bowel movements for 2-3 days, which has resolved over the weekend. Subsequently he had poor appetite, but denied any nausea or vomiting,  abdominal pain, fevers or chills. Patient's wife and started noticing that he was becoming more confused in the last 2 days, called his PCP who thought that it might be UTI and prescribed ciprofloxacin. Patient had taken 2 doses of ciprofloxacin without any significant improvement. The wife felt that his confusion was worsening and brought him to the ED, workup showed creatinine of 5.9 with a BUN of 68, potassium of 5.8, patient was found to have a UTI. Patient was admitted for further workup.   Hospital Course:  Acute renal failure: Multifactorial but most likely prerenal due to patient's dehydration, diarrhea, poor oral intake and possible UTI, in the setting of NSAID use. Baseline BUN 18/creatinine 1.2 on the recent labs done at PCP, Dr. Osborne Casco office on 11/30/2011. Patient's AKI was felt most likely prerenal initially although FeNa greater than 1 did not fit. Renal ultrasound was done which did not show any hydronephrosis. Patient was placed on IV hydration and metformin, Naprosyn and all his oral hypoglycemic agents were held. Nephrology was consulted and recommended Continuing IV fluids with sodium bicarbonate. Patient's creatinine function however plateaued ~ 4, repeat renal ultrasound was obtained which showed possible chronic medical renal disease but no hydronephrosis. ANCA was negative, IFE of urine and protein electrophoresis showed elevation of free Kappa Lt chains and Free lambda Lt chains were elevated. SPEP is pending, although labs will be followed by renal service. Patient will followup with Dr. Mercy Moore in office and repeat renal function in 3 days outpatient.  Urinary tract infection: Urine culture showed only 15,000 colonies, patient was continued on IV Rocephin. Repeat urine culture on 02/01/2012 showed no growth.  Headache: During the hospitalization patient had an episode of headache which has resolved. Patient had no meningismus signs. CT head and MRI done were done which showed no  intracranial abnormality. Patient was also evaluated for cognitive function by speech pathology   .Diabetes mellitus: Uncontrolled and patient, hemoglobin A1c 6.3. Patient is on 4 oral hypoglycemic agents and is reluctant to be on insulin. Oral hypoglycemic agents were held and patient was started on insulin and patient. However with the new depressed renal function, patient  is at risk of having hypoglycemia multiple oral hypoglycemic agents, hence diabetic coordinator consult was placed for recommendations for outpatient management. Patient should have an endocrinology referral by his primary care physician, Dr. Osborne Casco. Per recommendations, he was DC'd on Levemir, sliding scale insulin, meal coverage and tradjenta. All the previous oral hypoglycemic agents were discontinued.   Day of Discharge BP 155/98  Pulse 86  Temp 98.3 F (36.8 C) (Oral)  Resp 20  Ht $R'6\' 2"'rG$  (1.88 m)  Wt 84.5 kg (186 lb 4.6 oz)  BMI 23.92 kg/m2  SpO2 95%  Physical Exam: General: Alert and awake oriented x3 not in any acute distress. HEENT: anicteric sclera, pupils reactive to light and accommodation CVS: S1-S2 clear no murmur rubs or gallops Chest: clear to auscultation bilaterally, no wheezing rales or rhonchi Abdomen: soft nontender, nondistended, normal bowel sounds, no organomegaly Extremities: no cyanosis, clubbing or edema noted bilaterally Neuro: Cranial nerves II-XII intact, no focal neurological deficits   The results of significant diagnostics from this hospitalization (including imaging, microbiology, ancillary and laboratory) are listed below for reference.    LAB RESULTS: Basic Metabolic Panel:  Lab 16/10/96 0600 02/04/12 1010 02/01/12 0615  NA 142 139 --  K 4.1 3.7 --  CL 110 108 --  CO2 20 20 --  GLUCOSE 166* 206* --  BUN 51* 47* --  CREATININE 4.63* 4.43* --  CALCIUM 8.8 8.7 --  MG -- -- 1.6  PHOS 4.0 -- --   Liver Function Tests:  Lab 02/05/12 0600 02/04/12 1010 01/31/12 1130  AST  -- -- 21  ALT -- -- 20  ALKPHOS -- -- 67  BILITOT -- -- 0.2*  PROT -- -- 6.5  ALBUMIN 2.7* 2.9* --   CBC:  Lab 02/05/12 0600 02/04/12 0620  WBC 9.3 9.5  NEUTROABS 7.0 --  HGB 9.4* 10.4*  HCT 28.5* 31.3*  MCV 91.6 --  PLT 155 177   CBG:  Lab 02/05/12 1208 02/05/12 0803  GLUCAP 202* 139*    Significant Diagnostic Studies:  Ct Head Wo Contrast  01/31/2012  *RADIOLOGY REPORT*  Clinical Data: Altered mental status  CT HEAD WITHOUT CONTRAST  Technique:  Contiguous axial images were obtained from the base of the skull through the vertex without contrast.  Comparison: CT scan of the head 03/19/2007, MRI of the brain 03/19/2007  Findings: No acute intracranial hemorrhage, acute infarction, mass, mass lesion, hydrocephalus or midline shift.  The gray-white interface is well defined.  Very mild cerebral volume loss for age which appears unchanged without significant interval progression compared to the prior study.  Stable small lacunar infarct in the left putamen.  Mastoid air cells are well-aerated.  There are several small polyps versus mucous retention cysts along the medial walls of the bilateral maxillary sinuses. Small air fluid level dependently within the left sphenoid air cell.  The globes intact. No bony calvarial abnormality.  IMPRESSION:  1.  No acute intracranial abnormality. 2.  Mild age related senescent changes without significant interval progression compared to 03/19/2007.  Original Report Authenticated By: Myrle Sheng   US Renal  01/31/2012  *RADIOLOGY REPORT*  Clinical Data: Acute renal insufficiency.  Evaluate for hydronephrosis.  RENAL/URINARY TRACT ULTRASOUND COMPLETE  Comparison:  None.  Findings:  Right Kidney:  10.4 cm. Normal size and echotexture.  No focal abnormality.  No hydronephrosis.  Left Kidney:  10.5 cm.  7.0 cm benign-appearing cyst off the upper pole.  No hydronephrosis.  Normal echotexture.  Bladder:  Normal for degree distention.  Bilateral ureteral jets visualized.   IMPRESSION: 7 cm benign-appearing left upper pole renal cyst.  No hydronephrosis or acute findings.  Original Report Authenticated By: Raelyn Number, M.D.     Disposition and Follow-up: Discharge Orders    Future Orders Please Complete By Expires   Diet Carb Modified      Increase activity slowly      Discharge instructions      Comments:   1) Please get the labs done on 02/07/12, renal function   2) You need Endocrinology referral, please discuss with Dr Osborne Casco       DISPOSITION: Home  DIET: Carb modified ACTIVITY: As tolerated TESTS THAT NEED FOLLOW-UP Renal function on 02/07/2012  DISCHARGE FOLLOW-UP Follow-up Information    Follow up with MATTINGLY,MICHAEL T, MD. Schedule an appointment as soon as possible for a visit in 1 week. (please call office to make appt, labs on Thursday 02/07/12)    Contact information:   [address] [place] [phone]       Follow up with Haywood Pao, MD. Schedule an appointment as soon as possible for a visit in 7 days. (for hospital follow-up, you need Endocrinology referral  )    Contact information:   [place], [place]. [place] [phone]          Time spent on Discharge: 45 minutes  Signed:   Earlin Sweeden M.D. [facility] 02/05/2012, 12:18 PM Pager: [phone]  If 7PM-7AM, please contact night-coverage [email or web address] Password [email or web address]

## 2012-02-05 NOTE — Progress Notes (Signed)
   CARE MANAGEMENT NOTE 02/05/2012  Patient:  Victor Wallace, Victor Wallace   Account Number:  [id number]  Date Initiated:  02/05/2012  Documentation initiated by:  Lizabeth Leyden  Subjective/Objective Assessment:   Patient admitted with acute renal failure     Action/Plan:   Progression of care and discharge planning   Anticipated DC Date:  02/05/2012   Anticipated DC Plan:  [place]  CM consult      [facility] Choice  HOME HEALTH   Choice offered to / List presented to:  C-1 Patient        [place] arranged  HH-2 PT      [place].   Status of service:  In process, will continue to follow Medicare Important Message given?   (If response is "NO", the following Medicare IM given date fields will be blank) Date Medicare IM given:   Date Additional Medicare IM given:    Discharge Disposition:    Per UR Regulation:    If discussed at Long Length of Stay Meetings, dates discussed:    Comments:  02/05/2012  [place], [place]  Met with patient and wife regarding dishcarge planning and home health PT.  They selected [facility] for home PT.  [facility] called with referral for home PT.

## 2012-02-05 NOTE — Progress Notes (Signed)
Nephrology Service Daily Progress Note  Subjective:   Patient feels OK. His headache is better. No SOB and Nausea. No fever or chills.   ANCA: negative (Myeloperoxidase Abs <1; Serine Protease 3 <1)   IFE of urine and protein electrophoresis: Free Kappa Lt chains and Free lambda Lt chains elevated.  Pending urine eosinophils  Objective: Vital signs in last 24 hours: Filed Vitals:   02/04/12 1455 02/04/12 1700 02/04/12 2043 02/05/12 0448  BP: 148/80  176/96 183/99  Pulse: 76 81 80 94  Temp: 98.2 F (36.8 C) 98.2 F (36.8 C) 97.4 F (36.3 C) 98 F (36.7 C)  TempSrc: Oral Oral Oral Oral  Resp: $Remo'18 18 18 19  'GvhRa$ Height:      Weight:   186 lb 4.6 oz (84.5 kg)   SpO2: 98% 96% 97% 95%   Weight change: -1 lb 8.7 oz (-0.7 kg)  Intake/Output Summary (Last 24 hours) at 02/05/12 0728 Last data filed at 02/05/12 0446  Gross per 24 hour  Intake   1080 ml  Output   2195 ml  Net  -1115 ml   Urine Output: 850 (8/2), 2045 (8/3), 975 (8/4), 1220 (8/5), 2195 (8/6)  Body weight:  02/05/12 02/04/12   2043 186 lb 4.6 oz (84.5 kg) 02/03/12   2058 187 lb 13.3 oz (85.2 kg) 02/02/12:  2112 182 lbs 8.7 oz (82.8 kg) 02/01/12   2058 178 lb 9.2 oz  (81 kg) 01/31/12   1934  174 lb 6.1 oz (79.1 kg)  Physical Exam:  General: alert, cooperative, and in no apparent distress HEENT: pupils equal round and reactive to light, vision grossly intact, oropharynx clear and non-erythematous  Neck: supple, no lymphadenopathy, no JVD Lungs: clear to ascultation bilaterally, normal work of respiration, no wheezes, rales, ronchi Heart: regular rate and rhythm, no murmurs, gallops, or rubs Abdomen: soft, non-tender, non-distended, normal bowel sounds Extremities: 1+ pitting edema bilaterally Neurologic: alert & oriented X3, cranial nerves II-XII intact, strength grossly intact, sensation intact to light touch   Lab Results: Basic Metabolic Panel:  Lab 62/56/38 0600 02/04/12 1010 02/01/12 0615  NA 142 139  --  K 4.1 3.7 --  CL 110 108 --  CO2 20 20 --  GLUCOSE 166* 206* --  BUN 51* 47* --  CREATININE 4.63* 4.43* --  CALCIUM 8.8 8.7 --  MG -- -- 1.6  PHOS 4.0 3.4 --    Lab 02/05/12 0600 02/04/12 1010 02/04/12 0620 02/03/12 0605 02/02/12 0814  CREATININE 4.63* 4.43* 4.57* 4.61* 4.63*   Liver Function Tests:  Lab 02/05/12 0600 02/04/12 1010 01/31/12 1130  AST -- -- 21  ALT -- -- 20  ALKPHOS -- -- 67  BILITOT -- -- 0.2*  PROT -- -- 6.5  ALBUMIN 2.7* 2.9* --   CBC:  Lab 02/05/12 0600 02/04/12 0620 01/31/12 1130  WBC 9.3 9.5 --  NEUTROABS 7.0 -- 6.7  HGB 9.4* 10.4* --  HCT 28.5* 31.3* --  MCV 91.6 92.6 --  PLT 155 177 --   CBG:  Lab 02/04/12 2048 02/04/12 1717 02/04/12 1142 02/04/12 0822 02/03/12 2109 02/03/12 1648  GLUCAP 163* 184* 160* 155* 287* 208*   Coagulation:  Lab 01/31/12 1130  LABPROT 14.6  INR 1.12   Anemia Panel:  Lab 02/01/12 0615  VITAMINB12 208*  FOLATE 16.2  FERRITIN 92  TIBC 219  IRON 59  RETICCTPCT 0.6  Vitamin B12: 208   Urinalysis:  Lab 02/01/12 0750 01/31/12 1209  COLORURINE YELLOW YELLOW  LABSPEC 1.014  1.015  PHURINE 5.5 5.5  GLUCOSEU 250* NEGATIVE  HGBUR TRACE* SMALL*  BILIRUBINUR NEGATIVE NEGATIVE  KETONESUR NEGATIVE 15*  PROTEINUR NEGATIVE 30*  UROBILINOGEN 0.2 0.2  NITRITE NEGATIVE NEGATIVE  LEUKOCYTESUR TRACE* MODERATE*   Micro Results: Recent Results (from the past 240 hour(s))  URINE CULTURE     Status: Normal   Collection Time   01/31/12 12:09 PM      Component Value Range Status Comment   Specimen Description URINE, CLEAN CATCH   Final    Special Requests NONE   Final    Culture  Setup Time 01/31/2012 13:02   Final    Colony Count 15,000 COLONIES/ML   Final    Culture     Final    Value: Multiple bacterial morphotypes present, none predominant. Suggest appropriate recollection if clinically indicated.   Report Status 02/01/2012 FINAL   Final   URINE CULTURE     Status: Normal   Collection Time   02/01/12 12:19  PM      Component Value Range Status Comment   Specimen Description URINE, CLEAN CATCH   Final    Special Requests NONE   Final    Culture  Setup Time 02/01/2012 12:44   Final    Colony Count NO GROWTH   Final    Culture NO GROWTH   Final    Report Status 02/02/2012 FINAL   Final    Studies/Results: Ct Head Wo Contrast  01/31/2012  *RADIOLOGY REPORT*  Clinical Data: Altered mental status  CT HEAD WITHOUT CONTRAST  Technique:  Contiguous axial images were obtained from the base of the skull through the vertex without contrast.  Comparison: CT scan of the head 03/19/2007, MRI of the brain 03/19/2007  Findings: No acute intracranial hemorrhage, acute infarction, mass, mass lesion, hydrocephalus or midline shift.  The gray-white interface is well defined.  Very mild cerebral volume loss for age which appears unchanged without significant interval progression compared to the prior study.  Stable small lacunar infarct in the left putamen.  Mastoid air cells are well-aerated.  There are several small polyps versus mucous retention cysts along the medial walls of the bilateral maxillary sinuses. Small air fluid level dependently within the left sphenoid air cell.  The globes intact. No bony calvarial abnormality.  IMPRESSION:  1.  No acute intracranial abnormality. 2.  Mild age related senescent changes without significant interval progression compared to 03/19/2007.  Original Report Authenticated By: Myrle Sheng   US Renal  01/31/2012  *RADIOLOGY REPORT*  Clinical Data: Acute renal insufficiency.  Evaluate for hydronephrosis.  RENAL/URINARY TRACT ULTRASOUND COMPLETE  Comparison:  None.  Findings:  Right Kidney:  10.4 cm. Normal size and echotexture.  No focal abnormality.  No hydronephrosis.  Left Kidney:  10.5 cm.  7.0 cm benign-appearing cyst off the upper pole.  No hydronephrosis.  Normal echotexture.  Bladder:  Normal for degree distention.  Bilateral ureteral jets visualized.  IMPRESSION: 7 cm benign-appearing  left upper pole renal cyst.  No hydronephrosis or acute findings.  Original Report Authenticated By: Raelyn Number, M.D.   Medications:  Scheduled Meds:    . aspirin EC  162 mg Oral Daily  . atorvastatin  40 mg Oral q1800  . calcium-vitamin D  1 tablet Oral BID  . cefTRIAXone (ROCEPHIN)  IV  1 g Intravenous Q24H  . cycloSPORINE  2 drop Both Eyes BID  . fluticasone  1 spray Each Nare Daily  . heparin  5,000 Units Subcutaneous Q8H  .  insulin aspart  0-15 Units Subcutaneous TID WC  . insulin aspart  0-5 Units Subcutaneous QHS  . insulin aspart  3 Units Subcutaneous TID WC  . insulin glargine  10 Units Subcutaneous QHS  . isosorbide mononitrate  30 mg Oral Daily  . loratadine  10 mg Oral Daily  . multivitamin  1 tablet Oral BID  . multivitamin with minerals  1 tablet Oral Daily  . pantoprazole  40 mg Oral Q1200  . pioglitazone  30 mg Oral QAC breakfast  . pyridOXINE  100 mg Oral BID  . sodium chloride  3 mL Intravenous Q12H  . sodium polystyrene  30 g Oral Once  . timolol  1 drop Left Eye Daily   Continuous Infusions:    . DISCONTD: sodium chloride 75 mL/hr at 02/03/12 1631  . DISCONTD: sodium chloride     PRN Meds:.acetaminophen, acetaminophen, alum & mag hydroxide-simeth, HYDROcodone-acetaminophen, HYDROmorphone (DILAUDID) injection, ondansetron (ZOFRAN) IV, ondansetron I have reviewed PRN and scheduled medications   Assessment/Plan:  The patient is an 78 yo man, history of prior MI, HTN, DM2, presenting with AMS x3 days, found to have AKI likely secondary to possible UTI and decreased PO intake, in setting of NSAID use.  # AKI: The patient's AKI was felt most likely pre-renal at time of initial consultation (although FeNa greater than one does not fit). Patient had loose stool and decreased oral intake prior to admission. Currently patient does not seem to be fluid overloaded, though he has chronic LE edema. Patient's renal US did not have hydronephrosis. Patient had good  urine output yesterday (2195 ml).  UTI may be contributing to his AKI, though Urine culture 8/1 showed 15,000 COLONIES/ML of multiple species, repeat urine culture 8/2 showed no growth (though ceftriaxone started 8/1).  Overall, his creatinine is not decreasing as quickly as expected; we are investigating possible post-renal or intrinsic renal causes. So far, ANCA is negative (Myeloperoxidase Abs <1; Serine Protease 3 <1); IFE of urine and protein electrophoresis showed elevation of free Kappa Lt chains and Free lambda Lt chains elevated. Repeated renal US did not showed hydronephrosis.  Patient's creatinine is slightly increased than yesterday (from 4.43 to 4.63)  -   Pending  SPEP -   Continue IVF to 75 cc/hr - Continue IV rocephin for UTI, started 8/1, plan to treat for 7 days total - FeNA  greater than 1 - Consider to restart IVF: NS 100 cc/h   #: AMS: improving.  It is likely due to delirium secondary to urinary tract infection and AKI.  Will treat the underlying causes as above.   #: Hyperkalemia: Potassium was 5.90 at presentation, but normalized (8/4). Today K is 4.1.  #: DM-2: SSI   #: HTN: Per primary team.  #. Anemia: Anemia panel shows adequate iron stores, but low vitamin B12 -start B12 supplementation, to continue as an outpatient     LOS: 5 days    Ivor Costa, MD PGY2, Internal Medicine Teaching Service Pager: [phone] I have seen and examined this patient and agree with plan by Dr Blaine Hamper.  Will plan to follow renal fx as outpt and arrange outpt FU.Marland Kitchen Fortune Brannigan T,MD 02/10/2012 10:08 AM

## 2012-02-06 LAB — UIFE/LIGHT CHAINS/TP QN, 24-HR UR
Albumin, U: DETECTED
Albumin, U: DETECTED
Alpha 1, Urine: DETECTED — AB
Alpha 2, Urine: DETECTED — AB
Beta, Urine: DETECTED — AB
Free Lambda Lt Chains,Ur: 1.09 mg/dL — ABNORMAL HIGH (ref 0.02–0.67)
Gamma Globulin, Urine: DETECTED — AB
Total Protein, Urine: 21.4 mg/dL

## 2012-02-07 DIAGNOSIS — N179 Acute kidney failure, unspecified: Secondary | ICD-10-CM | POA: Diagnosis not present

## 2012-02-11 DIAGNOSIS — E1159 Type 2 diabetes mellitus with other circulatory complications: Secondary | ICD-10-CM | POA: Diagnosis not present

## 2012-02-11 DIAGNOSIS — N189 Chronic kidney disease, unspecified: Secondary | ICD-10-CM | POA: Diagnosis not present

## 2012-02-13 DIAGNOSIS — E1159 Type 2 diabetes mellitus with other circulatory complications: Secondary | ICD-10-CM | POA: Diagnosis not present

## 2012-02-13 DIAGNOSIS — M999 Biomechanical lesion, unspecified: Secondary | ICD-10-CM | POA: Diagnosis not present

## 2012-02-13 DIAGNOSIS — M9981 Other biomechanical lesions of cervical region: Secondary | ICD-10-CM | POA: Diagnosis not present

## 2012-02-13 DIAGNOSIS — S139XXA Sprain of joints and ligaments of unspecified parts of neck, initial encounter: Secondary | ICD-10-CM | POA: Diagnosis not present

## 2012-02-13 DIAGNOSIS — R7309 Other abnormal glucose: Secondary | ICD-10-CM | POA: Diagnosis not present

## 2012-02-13 DIAGNOSIS — N179 Acute kidney failure, unspecified: Secondary | ICD-10-CM | POA: Diagnosis not present

## 2012-02-14 DIAGNOSIS — S139XXA Sprain of joints and ligaments of unspecified parts of neck, initial encounter: Secondary | ICD-10-CM | POA: Diagnosis not present

## 2012-02-14 DIAGNOSIS — M9981 Other biomechanical lesions of cervical region: Secondary | ICD-10-CM | POA: Diagnosis not present

## 2012-02-14 DIAGNOSIS — M999 Biomechanical lesion, unspecified: Secondary | ICD-10-CM | POA: Diagnosis not present

## 2012-02-15 DIAGNOSIS — S139XXA Sprain of joints and ligaments of unspecified parts of neck, initial encounter: Secondary | ICD-10-CM | POA: Diagnosis not present

## 2012-02-15 DIAGNOSIS — M9981 Other biomechanical lesions of cervical region: Secondary | ICD-10-CM | POA: Diagnosis not present

## 2012-02-18 DIAGNOSIS — N179 Acute kidney failure, unspecified: Secondary | ICD-10-CM | POA: Diagnosis not present

## 2012-02-18 DIAGNOSIS — D649 Anemia, unspecified: Secondary | ICD-10-CM | POA: Diagnosis not present

## 2012-02-20 DIAGNOSIS — M9981 Other biomechanical lesions of cervical region: Secondary | ICD-10-CM | POA: Diagnosis not present

## 2012-02-20 DIAGNOSIS — S139XXA Sprain of joints and ligaments of unspecified parts of neck, initial encounter: Secondary | ICD-10-CM | POA: Diagnosis not present

## 2012-02-25 DIAGNOSIS — E875 Hyperkalemia: Secondary | ICD-10-CM | POA: Diagnosis not present

## 2012-03-04 DIAGNOSIS — N179 Acute kidney failure, unspecified: Secondary | ICD-10-CM | POA: Diagnosis not present

## 2012-03-05 DIAGNOSIS — S139XXA Sprain of joints and ligaments of unspecified parts of neck, initial encounter: Secondary | ICD-10-CM | POA: Diagnosis not present

## 2012-03-05 DIAGNOSIS — E1159 Type 2 diabetes mellitus with other circulatory complications: Secondary | ICD-10-CM | POA: Diagnosis not present

## 2012-03-05 DIAGNOSIS — G609 Hereditary and idiopathic neuropathy, unspecified: Secondary | ICD-10-CM | POA: Diagnosis not present

## 2012-03-05 DIAGNOSIS — N189 Chronic kidney disease, unspecified: Secondary | ICD-10-CM | POA: Diagnosis not present

## 2012-03-05 DIAGNOSIS — M1990 Unspecified osteoarthritis, unspecified site: Secondary | ICD-10-CM | POA: Diagnosis not present

## 2012-03-05 DIAGNOSIS — M9981 Other biomechanical lesions of cervical region: Secondary | ICD-10-CM | POA: Diagnosis not present

## 2012-03-06 DIAGNOSIS — E1159 Type 2 diabetes mellitus with other circulatory complications: Secondary | ICD-10-CM | POA: Diagnosis not present

## 2012-03-06 DIAGNOSIS — I2510 Atherosclerotic heart disease of native coronary artery without angina pectoris: Secondary | ICD-10-CM | POA: Diagnosis not present

## 2012-03-06 DIAGNOSIS — N189 Chronic kidney disease, unspecified: Secondary | ICD-10-CM | POA: Diagnosis not present

## 2012-03-17 DIAGNOSIS — N179 Acute kidney failure, unspecified: Secondary | ICD-10-CM | POA: Diagnosis not present

## 2012-03-17 DIAGNOSIS — E1159 Type 2 diabetes mellitus with other circulatory complications: Secondary | ICD-10-CM | POA: Diagnosis not present

## 2012-03-17 DIAGNOSIS — Z23 Encounter for immunization: Secondary | ICD-10-CM | POA: Diagnosis not present

## 2012-03-17 DIAGNOSIS — E785 Hyperlipidemia, unspecified: Secondary | ICD-10-CM | POA: Diagnosis not present

## 2012-03-17 DIAGNOSIS — I2510 Atherosclerotic heart disease of native coronary artery without angina pectoris: Secondary | ICD-10-CM | POA: Diagnosis not present

## 2012-03-31 DIAGNOSIS — I10 Essential (primary) hypertension: Secondary | ICD-10-CM | POA: Diagnosis not present

## 2012-03-31 DIAGNOSIS — N179 Acute kidney failure, unspecified: Secondary | ICD-10-CM | POA: Diagnosis not present

## 2012-04-08 DIAGNOSIS — N189 Chronic kidney disease, unspecified: Secondary | ICD-10-CM | POA: Diagnosis not present

## 2012-04-08 DIAGNOSIS — I2510 Atherosclerotic heart disease of native coronary artery without angina pectoris: Secondary | ICD-10-CM | POA: Diagnosis not present

## 2012-04-08 DIAGNOSIS — E1159 Type 2 diabetes mellitus with other circulatory complications: Secondary | ICD-10-CM | POA: Diagnosis not present

## 2012-04-16 DIAGNOSIS — N179 Acute kidney failure, unspecified: Secondary | ICD-10-CM | POA: Diagnosis not present

## 2012-05-19 DIAGNOSIS — R82998 Other abnormal findings in urine: Secondary | ICD-10-CM | POA: Diagnosis not present

## 2012-05-19 DIAGNOSIS — Z125 Encounter for screening for malignant neoplasm of prostate: Secondary | ICD-10-CM | POA: Diagnosis not present

## 2012-05-19 DIAGNOSIS — E785 Hyperlipidemia, unspecified: Secondary | ICD-10-CM | POA: Diagnosis not present

## 2012-05-19 DIAGNOSIS — I2510 Atherosclerotic heart disease of native coronary artery without angina pectoris: Secondary | ICD-10-CM | POA: Diagnosis not present

## 2012-05-19 DIAGNOSIS — E1159 Type 2 diabetes mellitus with other circulatory complications: Secondary | ICD-10-CM | POA: Diagnosis not present

## 2012-05-22 DIAGNOSIS — N179 Acute kidney failure, unspecified: Secondary | ICD-10-CM | POA: Diagnosis not present

## 2012-05-26 DIAGNOSIS — Z1331 Encounter for screening for depression: Secondary | ICD-10-CM | POA: Diagnosis not present

## 2012-05-26 DIAGNOSIS — E785 Hyperlipidemia, unspecified: Secondary | ICD-10-CM | POA: Diagnosis not present

## 2012-05-26 DIAGNOSIS — I2510 Atherosclerotic heart disease of native coronary artery without angina pectoris: Secondary | ICD-10-CM | POA: Diagnosis not present

## 2012-05-26 DIAGNOSIS — Z125 Encounter for screening for malignant neoplasm of prostate: Secondary | ICD-10-CM | POA: Diagnosis not present

## 2012-05-26 DIAGNOSIS — E1159 Type 2 diabetes mellitus with other circulatory complications: Secondary | ICD-10-CM | POA: Diagnosis not present

## 2012-05-26 DIAGNOSIS — Z0000 Encounter for general adult medical examination without abnormal findings: Secondary | ICD-10-CM | POA: Diagnosis not present

## 2012-05-26 DIAGNOSIS — N184 Chronic kidney disease, stage 4 (severe): Secondary | ICD-10-CM | POA: Diagnosis not present

## 2012-06-02 DIAGNOSIS — Z1212 Encounter for screening for malignant neoplasm of rectum: Secondary | ICD-10-CM | POA: Diagnosis not present

## 2012-06-04 DIAGNOSIS — H4011X0 Primary open-angle glaucoma, stage unspecified: Secondary | ICD-10-CM | POA: Diagnosis not present

## 2012-06-04 DIAGNOSIS — H3530 Unspecified macular degeneration: Secondary | ICD-10-CM | POA: Diagnosis not present

## 2012-06-23 DIAGNOSIS — N179 Acute kidney failure, unspecified: Secondary | ICD-10-CM | POA: Diagnosis not present

## 2012-06-27 DIAGNOSIS — M999 Biomechanical lesion, unspecified: Secondary | ICD-10-CM | POA: Diagnosis not present

## 2012-07-02 DIAGNOSIS — M999 Biomechanical lesion, unspecified: Secondary | ICD-10-CM | POA: Diagnosis not present

## 2012-07-08 DIAGNOSIS — N184 Chronic kidney disease, stage 4 (severe): Secondary | ICD-10-CM | POA: Diagnosis not present

## 2012-07-08 DIAGNOSIS — I2510 Atherosclerotic heart disease of native coronary artery without angina pectoris: Secondary | ICD-10-CM | POA: Diagnosis not present

## 2012-07-08 DIAGNOSIS — E1159 Type 2 diabetes mellitus with other circulatory complications: Secondary | ICD-10-CM | POA: Diagnosis not present

## 2012-07-11 DIAGNOSIS — M999 Biomechanical lesion, unspecified: Secondary | ICD-10-CM | POA: Diagnosis not present

## 2012-07-16 DIAGNOSIS — M999 Biomechanical lesion, unspecified: Secondary | ICD-10-CM | POA: Diagnosis not present

## 2012-07-17 DIAGNOSIS — N2581 Secondary hyperparathyroidism of renal origin: Secondary | ICD-10-CM | POA: Diagnosis not present

## 2012-07-17 DIAGNOSIS — D649 Anemia, unspecified: Secondary | ICD-10-CM | POA: Diagnosis not present

## 2012-08-06 DIAGNOSIS — M999 Biomechanical lesion, unspecified: Secondary | ICD-10-CM | POA: Diagnosis not present

## 2012-08-20 DIAGNOSIS — M999 Biomechanical lesion, unspecified: Secondary | ICD-10-CM | POA: Diagnosis not present

## 2012-08-29 DIAGNOSIS — H4011X0 Primary open-angle glaucoma, stage unspecified: Secondary | ICD-10-CM | POA: Diagnosis not present

## 2012-09-18 DIAGNOSIS — D649 Anemia, unspecified: Secondary | ICD-10-CM | POA: Diagnosis not present

## 2012-09-18 DIAGNOSIS — D509 Iron deficiency anemia, unspecified: Secondary | ICD-10-CM | POA: Diagnosis not present

## 2012-11-26 DIAGNOSIS — IMO0002 Reserved for concepts with insufficient information to code with codable children: Secondary | ICD-10-CM | POA: Diagnosis not present

## 2012-11-26 DIAGNOSIS — E1159 Type 2 diabetes mellitus with other circulatory complications: Secondary | ICD-10-CM | POA: Diagnosis not present

## 2012-11-26 DIAGNOSIS — N184 Chronic kidney disease, stage 4 (severe): Secondary | ICD-10-CM | POA: Diagnosis not present

## 2012-11-26 DIAGNOSIS — I2510 Atherosclerotic heart disease of native coronary artery without angina pectoris: Secondary | ICD-10-CM | POA: Diagnosis not present

## 2012-11-26 DIAGNOSIS — E785 Hyperlipidemia, unspecified: Secondary | ICD-10-CM | POA: Diagnosis not present

## 2012-11-26 DIAGNOSIS — M1990 Unspecified osteoarthritis, unspecified site: Secondary | ICD-10-CM | POA: Diagnosis not present

## 2012-11-26 DIAGNOSIS — G609 Hereditary and idiopathic neuropathy, unspecified: Secondary | ICD-10-CM | POA: Diagnosis not present

## 2012-11-26 DIAGNOSIS — K5900 Constipation, unspecified: Secondary | ICD-10-CM | POA: Diagnosis not present

## 2013-01-07 DIAGNOSIS — N184 Chronic kidney disease, stage 4 (severe): Secondary | ICD-10-CM | POA: Diagnosis not present

## 2013-01-07 DIAGNOSIS — E1129 Type 2 diabetes mellitus with other diabetic kidney complication: Secondary | ICD-10-CM | POA: Diagnosis not present

## 2013-01-07 DIAGNOSIS — J310 Chronic rhinitis: Secondary | ICD-10-CM | POA: Diagnosis not present

## 2013-01-07 DIAGNOSIS — Z6825 Body mass index (BMI) 25.0-25.9, adult: Secondary | ICD-10-CM | POA: Diagnosis not present

## 2013-03-03 ENCOUNTER — Inpatient Hospital Stay (HOSPITAL_COMMUNITY)
Admission: EM | Admit: 2013-03-03 | Discharge: 2013-03-09 | DRG: 690 | Disposition: A | Discharge status: Home / Self Care | Payer: Medicare Other | Attending: Internal Medicine | Admitting: Internal Medicine

## 2013-03-03 ENCOUNTER — Emergency Department (HOSPITAL_COMMUNITY): Payer: Medicare Other

## 2013-03-03 DIAGNOSIS — E1149 Type 2 diabetes mellitus with other diabetic neurological complication: Secondary | ICD-10-CM | POA: Diagnosis present

## 2013-03-03 DIAGNOSIS — K5289 Other specified noninfective gastroenteritis and colitis: Secondary | ICD-10-CM | POA: Diagnosis present

## 2013-03-03 DIAGNOSIS — N281 Cyst of kidney, acquired: Secondary | ICD-10-CM | POA: Diagnosis not present

## 2013-03-03 DIAGNOSIS — R509 Fever, unspecified: Secondary | ICD-10-CM | POA: Diagnosis not present

## 2013-03-03 DIAGNOSIS — I959 Hypotension, unspecified: Secondary | ICD-10-CM | POA: Diagnosis not present

## 2013-03-03 DIAGNOSIS — N184 Chronic kidney disease, stage 4 (severe): Secondary | ICD-10-CM | POA: Diagnosis present

## 2013-03-03 DIAGNOSIS — M169 Osteoarthritis of hip, unspecified: Secondary | ICD-10-CM | POA: Diagnosis present

## 2013-03-03 DIAGNOSIS — N201 Calculus of ureter: Secondary | ICD-10-CM | POA: Diagnosis present

## 2013-03-03 DIAGNOSIS — I252 Old myocardial infarction: Secondary | ICD-10-CM

## 2013-03-03 DIAGNOSIS — N1330 Unspecified hydronephrosis: Secondary | ICD-10-CM | POA: Diagnosis not present

## 2013-03-03 DIAGNOSIS — E785 Hyperlipidemia, unspecified: Secondary | ICD-10-CM | POA: Diagnosis present

## 2013-03-03 DIAGNOSIS — I129 Hypertensive chronic kidney disease with stage 1 through stage 4 chronic kidney disease, or unspecified chronic kidney disease: Secondary | ICD-10-CM | POA: Diagnosis present

## 2013-03-03 DIAGNOSIS — N3289 Other specified disorders of bladder: Secondary | ICD-10-CM | POA: Diagnosis present

## 2013-03-03 DIAGNOSIS — E119 Type 2 diabetes mellitus without complications: Secondary | ICD-10-CM | POA: Diagnosis not present

## 2013-03-03 DIAGNOSIS — R5381 Other malaise: Secondary | ICD-10-CM | POA: Diagnosis present

## 2013-03-03 DIAGNOSIS — D649 Anemia, unspecified: Secondary | ICD-10-CM | POA: Diagnosis present

## 2013-03-03 DIAGNOSIS — K5900 Constipation, unspecified: Secondary | ICD-10-CM | POA: Diagnosis not present

## 2013-03-03 DIAGNOSIS — E7800 Pure hypercholesterolemia, unspecified: Secondary | ICD-10-CM | POA: Diagnosis present

## 2013-03-03 DIAGNOSIS — N390 Urinary tract infection, site not specified: Principal | ICD-10-CM | POA: Diagnosis present

## 2013-03-03 DIAGNOSIS — Z8249 Family history of ischemic heart disease and other diseases of the circulatory system: Secondary | ICD-10-CM

## 2013-03-03 DIAGNOSIS — N19 Unspecified kidney failure: Secondary | ICD-10-CM | POA: Diagnosis not present

## 2013-03-03 DIAGNOSIS — I2510 Atherosclerotic heart disease of native coronary artery without angina pectoris: Secondary | ICD-10-CM | POA: Diagnosis present

## 2013-03-03 DIAGNOSIS — N2581 Secondary hyperparathyroidism of renal origin: Secondary | ICD-10-CM | POA: Diagnosis present

## 2013-03-03 DIAGNOSIS — K529 Noninfective gastroenteritis and colitis, unspecified: Secondary | ICD-10-CM

## 2013-03-03 DIAGNOSIS — R531 Weakness: Secondary | ICD-10-CM

## 2013-03-03 DIAGNOSIS — N179 Acute kidney failure, unspecified: Secondary | ICD-10-CM | POA: Diagnosis present

## 2013-03-03 DIAGNOSIS — E872 Acidosis: Secondary | ICD-10-CM | POA: Diagnosis not present

## 2013-03-03 DIAGNOSIS — N200 Calculus of kidney: Secondary | ICD-10-CM | POA: Diagnosis present

## 2013-03-03 DIAGNOSIS — K219 Gastro-esophageal reflux disease without esophagitis: Secondary | ICD-10-CM | POA: Diagnosis present

## 2013-03-03 DIAGNOSIS — H4010X0 Unspecified open-angle glaucoma, stage unspecified: Secondary | ICD-10-CM | POA: Diagnosis present

## 2013-03-03 DIAGNOSIS — M1610 Unilateral primary osteoarthritis, unspecified hip: Secondary | ICD-10-CM | POA: Diagnosis present

## 2013-03-03 DIAGNOSIS — E1142 Type 2 diabetes mellitus with diabetic polyneuropathy: Secondary | ICD-10-CM | POA: Diagnosis present

## 2013-03-03 DIAGNOSIS — Z794 Long term (current) use of insulin: Secondary | ICD-10-CM

## 2013-03-03 DIAGNOSIS — R109 Unspecified abdominal pain: Secondary | ICD-10-CM | POA: Diagnosis not present

## 2013-03-03 DIAGNOSIS — R1032 Left lower quadrant pain: Secondary | ICD-10-CM | POA: Diagnosis not present

## 2013-03-03 DIAGNOSIS — D739 Disease of spleen, unspecified: Secondary | ICD-10-CM | POA: Diagnosis present

## 2013-03-03 DIAGNOSIS — Z7982 Long term (current) use of aspirin: Secondary | ICD-10-CM

## 2013-03-03 DIAGNOSIS — M25559 Pain in unspecified hip: Secondary | ICD-10-CM | POA: Diagnosis not present

## 2013-03-03 DIAGNOSIS — Z79899 Other long term (current) drug therapy: Secondary | ICD-10-CM

## 2013-03-03 DIAGNOSIS — H409 Unspecified glaucoma: Secondary | ICD-10-CM | POA: Diagnosis present

## 2013-03-03 DIAGNOSIS — I10 Essential (primary) hypertension: Secondary | ICD-10-CM | POA: Diagnosis not present

## 2013-03-03 DIAGNOSIS — Z87440 Personal history of urinary (tract) infections: Secondary | ICD-10-CM | POA: Diagnosis not present

## 2013-03-03 DIAGNOSIS — N289 Disorder of kidney and ureter, unspecified: Secondary | ICD-10-CM | POA: Diagnosis not present

## 2013-03-03 DIAGNOSIS — E875 Hyperkalemia: Secondary | ICD-10-CM | POA: Diagnosis not present

## 2013-03-03 LAB — COMPREHENSIVE METABOLIC PANEL
ALT: 19 U/L (ref 0–53)
AST: 29 U/L (ref 0–37)
CO2: 22 mEq/L (ref 19–32)
Calcium: 9 mg/dL (ref 8.4–10.5)
Chloride: 99 mEq/L (ref 96–112)
Creatinine, Ser: 2.52 mg/dL — ABNORMAL HIGH (ref 0.50–1.35)
GFR calc Af Amer: 25 mL/min — ABNORMAL LOW (ref >90)
GFR calc non Af Amer: 22 mL/min — ABNORMAL LOW (ref >90)
Glucose, Bld: 325 mg/dL — ABNORMAL HIGH (ref 70–99)
Sodium: 133 mEq/L — ABNORMAL LOW (ref 135–145)
Total Bilirubin: 0.8 mg/dL (ref 0.3–1.2)

## 2013-03-03 LAB — URINALYSIS, ROUTINE W REFLEX MICROSCOPIC
Ketones, ur: 15 mg/dL — AB
Nitrite: POSITIVE — AB
Specific Gravity, Urine: 1.025 (ref 1.005–1.030)
Urobilinogen, UA: 0.2 mg/dL (ref 0.0–1.0)
pH: 5.5 (ref 5.0–8.0)

## 2013-03-03 LAB — URINE MICROSCOPIC-ADD ON

## 2013-03-03 MED ORDER — DEXTROSE 5 % IV SOLN
1.0000 g | Freq: Once | INTRAVENOUS | Status: AC
  Administered 2013-03-03: 1 g via INTRAVENOUS
  Filled 2013-03-03: qty 10

## 2013-03-03 MED ORDER — ONDANSETRON HCL 4 MG/2ML IJ SOLN
4.0000 mg | Freq: Once | INTRAMUSCULAR | Status: AC
  Administered 2013-03-03: 4 mg via INTRAVENOUS
  Filled 2013-03-03: qty 2

## 2013-03-03 MED ORDER — SODIUM CHLORIDE 0.9 % IV SOLN
1000.0000 mL | Freq: Once | INTRAVENOUS | Status: AC
  Administered 2013-03-03: 1000 mL via INTRAVENOUS

## 2013-03-03 MED ORDER — MORPHINE SULFATE 4 MG/ML IJ SOLN
4.0000 mg | Freq: Once | INTRAMUSCULAR | Status: AC
  Administered 2013-03-03: 4 mg via INTRAVENOUS
  Filled 2013-03-03: qty 1

## 2013-03-03 MED ORDER — SODIUM CHLORIDE 0.9 % IV SOLN
1000.0000 mL | INTRAVENOUS | Status: DC
  Administered 2013-03-03 – 2013-03-06 (×7): 1000 mL via INTRAVENOUS

## 2013-03-03 MED ORDER — IOHEXOL 300 MG/ML  SOLN: 50.0000 mL | Freq: Once | INTRAMUSCULAR | Status: AC | PRN

## 2013-03-03 MED ORDER — SODIUM CHLORIDE 0.9 % IV SOLN
1000.0000 mL | INTRAVENOUS | Status: DC
  Administered 2013-03-03: 1000 mL via INTRAVENOUS

## 2013-03-03 NOTE — ED Notes (Signed)
Pt states that before today he had not had a bm x 2-3 days. Lives at home w/ wife. Took 3 doses of miralax in 24 hrs per wife and had large BM today. Had not eaten in several days. Vomiting.

## 2013-03-03 NOTE — ED Notes (Signed)
Pt on monitor states that pt is in V-Tach, MD and PA notified. States that its not significant.
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
## 2013-03-03 NOTE — ED Notes (Signed)
Bed: FS23 Expected date:  Expected time:  Means of arrival:  Comments: 84 n/v

## 2013-03-03 NOTE — ED Notes (Signed)
Admitting MD made aware of pt's CBG.

## 2013-03-03 NOTE — ED Provider Notes (Signed)
CSN: [id number]     Arrival date & time 03/03/13  1704 History   First MD Initiated Contact with Patient 03/03/13 1734     Chief Complaint  Patient presents with  . Emesis  . Constipation   (Consider location/radiation/quality/duration/timing/severity/associated sxs/prior Treatment) Patient is a 79 y.o. male presenting with vomiting and constipation. The history is provided by the patient and medical records. No language interpreter was used.  Emesis Associated symptoms: abdominal pain   Associated symptoms: no diarrhea   Constipation Associated symptoms: abdominal pain, nausea and vomiting   Associated symptoms: no back pain, no diarrhea, no dysuria and no fever     Victor Wallace is a 79 y.o. male  with a hx of MI, hypertension, diabetes, GERD, CKD, intermittent constipation presents to the Emergency Department complaining of gradual, persistent, progressively worsening constipation beginning 3 days ago.  Patient's wife at bedside and she states that this has happened before however they usually give MiraLax with overnight relief of patient's constipation. She reports they gave MiraLax yesterday without relief yesterday or this morning. She reports he has eaten almost nothing secondary to his abdominal cramping.  She also reports that she called his primary care this morning he recommended 2 more doses of MiraLax today and if that did not help to bring him to the office for evaluation.  She reports that by mid morning secondary to his abdominal pain he was too weak to walk.  Patient with bowel movement at his home prior to EMS arrival and again here in the emergency department.  Patient's pain is persistent despite bowel movement x2. Wife reports emesis is nonbloody, nonbilious. She reports it is more dry heaves as he's not eaten. Nothing makes it better and eating and drinking makes it worse.  Pt denies fever, chills, headache, neck pain, chest pain, shortness of breath, diarrhea, dizziness,  syncope, dysuria, hematuria.  Wife reports the patient has had increasing weakness for the last several years with decreasing ability to walk distances. She states his weakness this morning with generalized and acute compared to his previous slow decline.   Past Medical History  Diagnosis Date  . MI (myocardial infarction)   . Hypertension   . Diabetes mellitus   . Hypercholesterolemia   . GERD (gastroesophageal reflux disease)    Past Surgical History  Procedure Laterality Date  . Hip surgery    . Tonsillectomy     No family history on file. History  Substance Use Topics  . Smoking status: Never Smoker   . Smokeless tobacco: Not on file  . Alcohol Use: Yes    Review of Systems  Constitutional: Negative for fever, diaphoresis, appetite change, fatigue and unexpected weight change.  HENT: Negative for mouth sores, trouble swallowing, neck pain and neck stiffness.   Respiratory: Negative for cough, chest tightness, shortness of breath, wheezing and stridor.   Cardiovascular: Negative for chest pain and palpitations.  Gastrointestinal: Positive for nausea, vomiting and abdominal pain. Negative for diarrhea, constipation, blood in stool, abdominal distention and rectal pain.  Genitourinary: Negative for dysuria, urgency, frequency, hematuria, flank pain and difficulty urinating.  Musculoskeletal: Negative for back pain.  Skin: Negative for rash.  Neurological: Negative for weakness.  Hematological: Negative for adenopathy.  Psychiatric/Behavioral: Negative for confusion.  All other systems reviewed and are negative.    Allergies  Review of patient's allergies indicates no known allergies.  Home Medications   Current Outpatient Rx  Name  Route  Sig  Dispense  Refill  .  aspirin EC 81 MG tablet   Oral   Take 162 mg by mouth daily.         Marland Kitchen atorvastatin (LIPITOR) 40 MG tablet   Oral   Take 40 mg by mouth daily.         . cycloSPORINE (RESTASIS) 0.05 % ophthalmic  emulsion   Both Eyes   Place 2 drops into both eyes 2 (two) times daily.         . fish oil-omega-3 fatty acids 1000 MG capsule   Oral   Take 1 capsule (1 g total) by mouth daily. Stop taking if having diarrhea         . insulin aspart (NOVOLOG) 100 UNIT/ML injection   Subcutaneous   Inject 0-15 Units into the skin 3 (three) times daily with meals.         . insulin detemir (LEVEMIR) 100 UNIT/ML injection   Subcutaneous   Inject 14 Units into the skin at bedtime.         . isosorbide mononitrate (IMDUR) 30 MG 24 hr tablet   Oral   Take 30 mg by mouth daily.         Marland Kitchen linagliptin (TRADJENTA) 5 MG TABS tablet   Oral   Take 1 tablet (5 mg total) by mouth daily.   30 tablet   3   . Multiple Vitamins-Minerals (MULTIVITAMIN WITH MINERALS) tablet   Oral   Take 2 tablets by mouth daily.          . Multiple Vitamins-Minerals (PRESERVISION AREDS 2) CAPS   Oral   Take 1 capsule by mouth 2 (two) times daily.          Marland Kitchen omeprazole (PRILOSEC) 20 MG capsule   Oral   Take 20 mg by mouth every other day.          . polyethylene glycol (MIRALAX / GLYCOLAX) packet   Oral   Take 17 g by mouth 2 (two) times daily as needed (for constipation).          . Pyridoxine HCl (VITAMIN B-6) 500 MG tablet   Oral   Take 500 mg by mouth daily.         . timolol (BETIMOL) 0.5 % ophthalmic solution   Left Eye   Place 1 drop into the left eye daily.          BP 125/54  Pulse 97  Temp(Src) 100.7 F (38.2 C) (Rectal)  Resp 20  SpO2 96% Physical Exam  Nursing note and vitals reviewed. Constitutional: He appears well-developed and well-nourished.  HENT:  Head: Normocephalic and atraumatic.  Mouth/Throat: Oropharynx is clear and moist.  Eyes: Conjunctivae are normal. Pupils are equal, round, and reactive to light. No scleral icterus.  Neck: Normal range of motion.  Cardiovascular: Regular rhythm, S1 normal, S2 normal, normal heart sounds and intact distal pulses.   Tachycardia present.   Pulses:      Radial pulses are 2+ on the right side, and 2+ on the left side.       Dorsalis pedis pulses are 2+ on the right side, and 2+ on the left side.       Posterior tibial pulses are 2+ on the right side, and 2+ on the left side.  Tachycardic  Pulmonary/Chest: Effort normal and breath sounds normal. No respiratory distress. He has no wheezes. He has no rales.  Abdominal: Soft. Normal appearance and bowel sounds are normal. He exhibits no distension and no mass. There  is tenderness in the left lower quadrant. There is guarding. There is no rebound and no CVA tenderness.  Left lower quadrant abdominal pain  Lymphadenopathy:    He has no cervical adenopathy.  Neurological: He is alert.  Skin: Skin is warm and dry. No erythema.  Psychiatric: He has a normal mood and affect. His behavior is normal.    ED Course  Procedures (including critical care time) Labs Review Labs Reviewed  COMPREHENSIVE METABOLIC PANEL - Abnormal; Notable for the following:    Sodium 133 (*)    Glucose, Bld 325 (*)    BUN 38 (*)    Creatinine, Ser 2.52 (*)    Albumin 3.3 (*)    GFR calc non Af Amer 22 (*)    GFR calc Af Amer 25 (*)    All other components within normal limits  URINALYSIS, ROUTINE W REFLEX MICROSCOPIC - Abnormal; Notable for the following:    Glucose, UA >1000 (*)    Hgb urine dipstick MODERATE (*)    Ketones, ur 15 (*)    Protein, ur 100 (*)    Nitrite POSITIVE (*)    Leukocytes, UA SMALL (*)    All other components within normal limits  URINE MICROSCOPIC-ADD ON - Abnormal; Notable for the following:    Casts HYALINE CASTS (*)    All other components within normal limits  LIPASE, BLOOD  TROPONIN I  CBC WITH DIFFERENTIAL   Imaging Review Ct Abdomen Pelvis Wo Contrast  03/03/2013   *RADIOLOGY REPORT*  Clinical Data: Vomiting.  Abdominal pain.  Constipation.  CT ABDOMEN AND PELVIS WITHOUT CONTRAST  Technique:  Multidetector CT imaging of the abdomen and  pelvis was performed following the standard protocol without intravenous contrast.  Comparison: No priors.  Findings:  Lung Bases: Scarring or subsegmental atelectasis in the right lower lobe posteriorly.  Calcifications of the aortic valve.  Abdomen/Pelvis:  Multiple calcifications within the liver and spleen, compatible with calcified granulomas.  Subcentimeter low attenuation lesion in segment 4A of the liver is incompletely characterized on today's noncontrast CT examination.  The unenhanced appearance of the gallbladder and bilateral adrenal glands is unremarkable.  In the head of the pancreas there is a poorly defined 2.4 x 1.6 cm low attenuation lesion which is incompletely characterized.  2.1 x 2.8 cm low attenuation lesion in the anterior aspect of the spleen is also incompletely characterized.  A 3.4 cm intermediate attenuation (22 HU) lesion extending exophytically off the medial aspect of the interpolar region of the right kidney, with additional subcentimeter low attenuation lesion immediately above that, both incompletely characterized.  Large exophytic rim calcified 6.6 x 4.6 cm low attenuation lesion extending off the interpolar region of the left kidney posteriorly.  Extensive atherosclerosis of the abdominal and pelvic vasculature, without evidence of frank aneurysm.  No significant volume of ascites.  No pneumoperitoneum.  No pathologic distension of small bowel.  No definite pathologic lymphadenopathy identified within the abdomen or pelvis on today's noncontrast CT examination. Normal appendix.  The urinary bladder is unremarkable in appearance.  There is some thickening of some small bowel loops, most pronounced in the proximal jejunum, which could suggest inflammation.  Musculoskeletal: Old healed fractures of the inferior pubic rami bilaterally.  Status post right total hip arthroplasty. There are no aggressive appearing lytic or blastic lesions noted in the visualized portions of the  skeleton.  IMPRESSION: 1.  Mild thickening of the proximal small bowel in the region of the proximal jejunum.  This is  nonspecific, but could suggest infection or inflammation (i.e., enteritis).  Clinical correlation is recommended. 2.  2.4 x 1.6 cm low attenuation lesion in the head of the pancreas, low attenuation splenic lesion, and numerous bilateral renal lesions (two of which appeared complex) are all incompletely characterized on today's noncontrast CT examination. Although these may simply all represent benign lesions such as cysts, further evaluation of the abdomen with non emergent MRI with and without IV gadolinium is recommended to fully characterize these lesions, as any one of these could potentially be neoplastic. 3.  Extensive atherosclerosis. 4.  Sequelae of old granulomatous disease with numerous calcified granulomas in the liver and spleen. 5.  Compression fracture at L1 with approximately 20% loss of anterior vertebral body height which appears old. 6.  Additional incidental findings, as above.   Original Report Authenticated By: Vinnie Langton, M.D.   ECG:  Date: 03/03/2013  Rate: 105  Rhythm: sinus tachycardia  QRS Axis: normal  Intervals: normal  ST/T Wave abnormalities: normal  Conduction Disutrbances:right bundle branch block  Narrative Interpretation: RBBB with tachycardia; unchanged from 81/2013 Od EKG Reviewed: unchanged    MDM   1. Fever   2. UTI (lower urinary tract infection)   3. Generalized weakness      Victor Wallace presents for abd pain, constipation and generalized weakness.  Pt tachycardic with persistent LLQ abd pain.  Will obtain ECG, labs and CT scan.  Will also control pain.    Glucose 290. UA with evidence of urinary tract infection as he has positive nitrites, positive leukocytes and white blood cells.  CMP with elevated glucose and elevated BUN and creatinine. Unknown if this is patient's baseline. CT abdomen pelvis with mild thickening of  the proximal small bowel region of the proximal jejunum with possible enteritis.  Also of note are several lesions throughout the abdomen is with old L1 compression fracture.  I personally reviewed the imaging tests through PACS system.  I reviewed available ER/hospitalization records through the EMR.    Concern for patient's persistent weakness likely secondary to his constipation and urinary tract infection. Patient given Rocephin 1 g IV here in the department. ECG without changes and persistent right bundle branch.  Troponin negative. Will proceed with admission.  Dr. Alvino Chapel was consulted, evaluated this patient with me and agrees with the plan.     Jarrett Soho Areen Trautner, PA-C 03/04/13 [phone]

## 2013-03-04 ENCOUNTER — Inpatient Hospital Stay (HOSPITAL_COMMUNITY): Payer: Medicare Other

## 2013-03-04 ENCOUNTER — Encounter (HOSPITAL_COMMUNITY): Payer: Self-pay | Admitting: *Deleted

## 2013-03-04 DIAGNOSIS — M25559 Pain in unspecified hip: Secondary | ICD-10-CM | POA: Diagnosis not present

## 2013-03-04 DIAGNOSIS — K5900 Constipation, unspecified: Secondary | ICD-10-CM | POA: Diagnosis not present

## 2013-03-04 DIAGNOSIS — N289 Disorder of kidney and ureter, unspecified: Secondary | ICD-10-CM | POA: Diagnosis not present

## 2013-03-04 DIAGNOSIS — I10 Essential (primary) hypertension: Secondary | ICD-10-CM | POA: Diagnosis not present

## 2013-03-04 LAB — HEMOGLOBIN A1C
Hgb A1c MFr Bld: 7.6 % — ABNORMAL HIGH (ref <5.7)
Mean Plasma Glucose: 171 mg/dL — ABNORMAL HIGH (ref <117)

## 2013-03-04 LAB — CBC
MCV: 90.3 fL (ref 78.0–100.0)
Platelets: 154 10*3/uL (ref 150–400)
RBC: 3.72 MIL/uL — ABNORMAL LOW (ref 4.22–5.81)
RDW: 14.2 % (ref 11.5–15.5)
WBC: 26.5 10*3/uL — ABNORMAL HIGH (ref 4.0–10.5)

## 2013-03-04 LAB — GLUCOSE, CAPILLARY
Glucose-Capillary: 164 mg/dL — ABNORMAL HIGH (ref 70–99)
Glucose-Capillary: 167 mg/dL — ABNORMAL HIGH (ref 70–99)
Glucose-Capillary: 266 mg/dL — ABNORMAL HIGH (ref 70–99)
Glucose-Capillary: 297 mg/dL — ABNORMAL HIGH (ref 70–99)

## 2013-03-04 LAB — BASIC METABOLIC PANEL
CO2: 24 mEq/L (ref 19–32)
Calcium: 8.3 mg/dL — ABNORMAL LOW (ref 8.4–10.5)
Creatinine, Ser: 2.79 mg/dL — ABNORMAL HIGH (ref 0.50–1.35)
GFR calc Af Amer: 22 mL/min — ABNORMAL LOW (ref >90)
GFR calc non Af Amer: 19 mL/min — ABNORMAL LOW (ref >90)
Sodium: 132 mEq/L — ABNORMAL LOW (ref 135–145)

## 2013-03-04 MED ORDER — ALUM & MAG HYDROXIDE-SIMETH 200-200-20 MG/5ML PO SUSP
30.0000 mL | Freq: Four times a day (QID) | ORAL | Status: DC | PRN
  Administered 2013-03-05: 30 mL via ORAL
  Filled 2013-03-04: qty 30

## 2013-03-04 MED ORDER — METRONIDAZOLE IN NACL 5-0.79 MG/ML-% IV SOLN
500.0000 mg | Freq: Three times a day (TID) | INTRAVENOUS | Status: DC
  Administered 2013-03-04 – 2013-03-05 (×5): 500 mg via INTRAVENOUS
  Filled 2013-03-04 (×7): qty 100

## 2013-03-04 MED ORDER — CIPROFLOXACIN IN D5W 400 MG/200ML IV SOLN
400.0000 mg | INTRAVENOUS | Status: DC
  Administered 2013-03-04 – 2013-03-05 (×2): 400 mg via INTRAVENOUS
  Filled 2013-03-04 (×2): qty 200

## 2013-03-04 MED ORDER — ADULT MULTIVITAMIN W/MINERALS CH
2.0000 | ORAL_TABLET | Freq: Every day | ORAL | Status: DC
  Administered 2013-03-05 – 2013-03-09 (×3): 2 via ORAL
  Filled 2013-03-04 (×8): qty 2

## 2013-03-04 MED ORDER — HYDROCODONE-ACETAMINOPHEN 5-325 MG PO TABS
1.0000 | ORAL_TABLET | ORAL | Status: DC | PRN
  Administered 2013-03-04 – 2013-03-05 (×3): 2 via ORAL
  Filled 2013-03-04 (×3): qty 2

## 2013-03-04 MED ORDER — ZOLPIDEM TARTRATE 5 MG PO TABS: 5.0000 mg | ORAL_TABLET | Freq: Every evening | ORAL | Status: DC | PRN

## 2013-03-04 MED ORDER — ONDANSETRON HCL 4 MG/2ML IJ SOLN: 4.0000 mg | Freq: Four times a day (QID) | INTRAMUSCULAR | Status: DC | PRN

## 2013-03-04 MED ORDER — DOCUSATE SODIUM 100 MG PO CAPS
100.0000 mg | ORAL_CAPSULE | Freq: Two times a day (BID) | ORAL | Status: DC
  Administered 2013-03-04 – 2013-03-07 (×5): 100 mg via ORAL
  Filled 2013-03-04 (×9): qty 1

## 2013-03-04 MED ORDER — SENNA 8.6 MG PO TABS
1.0000 | ORAL_TABLET | Freq: Two times a day (BID) | ORAL | Status: DC
  Administered 2013-03-04 – 2013-03-07 (×5): 8.6 mg via ORAL
  Filled 2013-03-04 (×7): qty 1

## 2013-03-04 MED ORDER — TIMOLOL MALEATE 0.5 % OP SOLN
1.0000 [drp] | Freq: Every day | OPHTHALMIC | Status: DC
  Administered 2013-03-04 – 2013-03-09 (×6): 1 [drp] via OPHTHALMIC
  Filled 2013-03-04: qty 5

## 2013-03-04 MED ORDER — ONDANSETRON HCL 4 MG PO TABS: 4.0000 mg | ORAL_TABLET | Freq: Four times a day (QID) | ORAL | Status: DC | PRN

## 2013-03-04 MED ORDER — POLYETHYLENE GLYCOL 3350 17 G PO PACK
17.0000 g | PACK | Freq: Every day | ORAL | Status: DC
  Administered 2013-03-05 – 2013-03-07 (×2): 17 g via ORAL
  Filled 2013-03-04 (×4): qty 1

## 2013-03-04 MED ORDER — OMEGA-3-ACID ETHYL ESTERS 1 G PO CAPS
1.0000 g | ORAL_CAPSULE | Freq: Every day | ORAL | Status: DC
  Administered 2013-03-05 – 2013-03-09 (×5): 1 g via ORAL
  Filled 2013-03-04 (×6): qty 1

## 2013-03-04 MED ORDER — INSULIN DETEMIR 100 UNIT/ML ~~LOC~~ SOLN
18.0000 [IU] | Freq: Every day | SUBCUTANEOUS | Status: DC
  Administered 2013-03-04 – 2013-03-06 (×3): 18 [IU] via SUBCUTANEOUS
  Filled 2013-03-04 (×3): qty 0.18

## 2013-03-04 MED ORDER — TIMOLOL HEMIHYDRATE 0.5 % OP SOLN: 1.0000 [drp] | Freq: Every day | OPHTHALMIC | Status: DC

## 2013-03-04 MED ORDER — ENOXAPARIN SODIUM 30 MG/0.3ML ~~LOC~~ SOLN
30.0000 mg | SUBCUTANEOUS | Status: DC
  Administered 2013-03-04 – 2013-03-09 (×5): 30 mg via SUBCUTANEOUS
  Filled 2013-03-04 (×6): qty 0.3

## 2013-03-04 MED ORDER — MORPHINE SULFATE 2 MG/ML IJ SOLN
2.0000 mg | INTRAMUSCULAR | Status: DC | PRN
  Administered 2013-03-04 – 2013-03-05 (×3): 2 mg via INTRAVENOUS
  Filled 2013-03-04 (×3): qty 1

## 2013-03-04 MED ORDER — OMEGA-3 FATTY ACIDS 1000 MG PO CAPS: 1.0000 g | ORAL_CAPSULE | Freq: Every day | ORAL | Status: DC

## 2013-03-04 MED ORDER — INSULIN ASPART 100 UNIT/ML ~~LOC~~ SOLN
0.0000 [IU] | Freq: Three times a day (TID) | SUBCUTANEOUS | Status: DC
  Administered 2013-03-04: 08:00:00 8 [IU] via SUBCUTANEOUS
  Administered 2013-03-05: 3 [IU] via SUBCUTANEOUS
  Administered 2013-03-05: 2 [IU] via SUBCUTANEOUS
  Administered 2013-03-06: 3 [IU] via SUBCUTANEOUS
  Administered 2013-03-07: 2 [IU] via SUBCUTANEOUS
  Administered 2013-03-07 – 2013-03-08 (×2): 3 [IU] via SUBCUTANEOUS

## 2013-03-04 MED ORDER — ISOSORBIDE MONONITRATE ER 30 MG PO TB24
30.0000 mg | ORAL_TABLET | Freq: Every day | ORAL | Status: DC
  Administered 2013-03-04: 11:00:00 30 mg via ORAL
  Filled 2013-03-04: qty 1

## 2013-03-04 MED ORDER — INSULIN ASPART 100 UNIT/ML ~~LOC~~ SOLN
0.0000 [IU] | Freq: Every day | SUBCUTANEOUS | Status: DC
  Administered 2013-03-08: 2 [IU] via SUBCUTANEOUS

## 2013-03-04 MED ORDER — PANTOPRAZOLE SODIUM 40 MG PO TBEC
40.0000 mg | DELAYED_RELEASE_TABLET | Freq: Every day | ORAL | Status: DC
  Administered 2013-03-04 – 2013-03-09 (×6): 40 mg via ORAL
  Filled 2013-03-04 (×6): qty 1

## 2013-03-04 MED ORDER — VITAMIN B-6 100 MG PO TABS
500.0000 mg | ORAL_TABLET | Freq: Every day | ORAL | Status: DC
  Administered 2013-03-05 – 2013-03-09 (×5): 500 mg via ORAL
  Filled 2013-03-04 (×6): qty 5

## 2013-03-04 MED ORDER — CYCLOSPORINE 0.05 % OP EMUL
2.0000 [drp] | Freq: Two times a day (BID) | OPHTHALMIC | Status: DC
  Administered 2013-03-04: 01:00:00 1 [drp] via OPHTHALMIC
  Administered 2013-03-04 – 2013-03-09 (×11): 2 [drp] via OPHTHALMIC
  Filled 2013-03-04 (×15): qty 1

## 2013-03-04 MED ORDER — ASPIRIN EC 81 MG PO TBEC
162.0000 mg | DELAYED_RELEASE_TABLET | Freq: Every day | ORAL | Status: DC
  Administered 2013-03-05 – 2013-03-09 (×4): 162 mg via ORAL
  Filled 2013-03-04 (×6): qty 2

## 2013-03-04 MED ORDER — INSULIN DETEMIR 100 UNIT/ML ~~LOC~~ SOLN
14.0000 [IU] | Freq: Every day | SUBCUTANEOUS | Status: DC
  Administered 2013-03-04: 14 [IU] via SUBCUTANEOUS
  Filled 2013-03-04: qty 0.14

## 2013-03-04 MED ORDER — ATORVASTATIN CALCIUM 40 MG PO TABS
40.0000 mg | ORAL_TABLET | Freq: Every day | ORAL | Status: DC
  Administered 2013-03-04 – 2013-03-08 (×5): 40 mg via ORAL
  Filled 2013-03-04 (×7): qty 1

## 2013-03-04 MED ORDER — LINAGLIPTIN 5 MG PO TABS
5.0000 mg | ORAL_TABLET | Freq: Every day | ORAL | Status: DC
  Administered 2013-03-04 – 2013-03-09 (×6): 5 mg via ORAL
  Filled 2013-03-04 (×6): qty 1

## 2013-03-04 NOTE — Progress Notes (Signed)
Utilization review completed.  

## 2013-03-04 NOTE — Progress Notes (Signed)
Report to [facility] RN, patient to transfer to [facility]

## 2013-03-04 NOTE — Progress Notes (Signed)
ANTIBIOTIC CONSULT NOTE - INITIAL  Pharmacy Consult for cipro Indication: Enteritis and UTI  No Known Allergies  Patient Measurements: Height: 6\' 2"  (188 cm) Weight: 186 lb 4.6 oz (84.5 kg) IBW/kg (Calculated) : 82.2   Vital Signs: Temp: 98.5 F (36.9 C) (09/03 0017) Temp src: Oral (09/03 0017) BP: 132/70 mmHg (09/03 0017) Pulse Rate: 77 (09/03 0017) Intake/Output from previous day:   Intake/Output from this shift:    Labs:  Recent Labs  03/03/13 1814  CREATININE 2.52*   Estimated Creatinine Clearance: 24.5 ml/min (by C-G formula based on Cr of 2.52). No results found for this basename: VANCOTROUGH, VANCOPEAK, VANCORANDOM, GENTTROUGH, GENTPEAK, GENTRANDOM, TOBRATROUGH, TOBRAPEAK, TOBRARND, AMIKACINPEAK, AMIKACINTROU, AMIKACIN,  in the last 72 hours   Microbiology: No results found for this or any previous visit (from the past 720 hour(s)).  Medical History: Past Medical History  Diagnosis Date  . MI (myocardial infarction)   . Hypertension   . Diabetes mellitus   . Hypercholesterolemia   . GERD (gastroesophageal reflux disease)     Medications:  Scheduled:  . aspirin EC  162 mg Oral Daily  . atorvastatin  40 mg Oral Daily  . ciprofloxacin  400 mg Intravenous Q24H  . cycloSPORINE  2 drop Both Eyes BID  . docusate sodium  100 mg Oral BID  . enoxaparin (LOVENOX) injection  30 mg Subcutaneous Q24H  . insulin aspart  0-15 Units Subcutaneous TID WC  . insulin aspart  0-5 Units Subcutaneous QHS  . insulin detemir  14 Units Subcutaneous QHS  . isosorbide mononitrate  30 mg Oral Daily  . linagliptin  5 mg Oral Daily  . metronidazole  500 mg Intravenous Q8H  . multivitamin with minerals  2 tablet Oral Daily  . omega-3 acid ethyl esters  1 g Oral Daily  . pantoprazole  40 mg Oral Daily  . polyethylene glycol  17 g Oral Daily  . vitamin B-6  500 mg Oral Daily  . senna  1 tablet Oral BID  . timolol  1 drop Left Eye Daily   Infusions:  . sodium chloride 1,000  mL (03/03/13 1820)   Assessment: 79 yo admitted with eneritis and UTI.  Cipro per Rx.  Flagyl per MD.  Goal of Therapy:  Treat infection  Plan:   Cipro 400mg  IV q24h.  F/U Scr/adjust frequency if needed  Dorrene German 03/04/2013,12:34 AM

## 2013-03-04 NOTE — ED Provider Notes (Signed)
Medical screening examination/treatment/procedure(s) were performed by non-physician practitioner and as supervising physician I was immediately available for consultation/collaboration.  Jasper Riling. Alvino Chapel, MD 03/04/13 [phone]
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
## 2013-03-04 NOTE — Evaluation (Addendum)
Physical Therapy Evaluation Patient Details Name: ZAIDIN BLYDEN MRN: [id number] DOB: 02/14/1925 Today's Date: 03/04/2013 Time: 6384-5364 PT Time Calculation (min): 20 min  PT Assessment / Plan / Recommendation History of Present Illness  DARY DILAURO is an 79 y.o. male.  Pt presents w/ several days of worsening constipation that has progressed into abdominal pain, chills, weakness and poor po intake for the past several days.   Clinical Impression  Will continue to follow;  Pt  Wife states she can assist prn at home;    PT Assessment  Patient needs continued PT services    Follow Up Recommendations  Home health PT;Supervision/Assistance - 24 hour    Does the patient have the potential to tolerate intense rehabilitation      Barriers to Discharge        Equipment Recommendations       Recommendations for Other Services     Frequency Min 3X/week    Precautions / Restrictions Precautions Precautions: Fall Restrictions Weight Bearing Restrictions: No   Pertinent Vitals/Pain      Mobility  Bed Mobility Bed Mobility: Supine to Sit Supine to Sit: HOB elevated;4: Min assist;3: Mod assist Details for Bed Mobility Assistance: incr time and cues for self assist  Transfers Transfers: Sit to Stand;Stand to Sit Sit to Stand: 4: Min assist;From bed;From elevated surface Stand to Sit: 3: Mod assist;To chair/3-in-1 Details for Transfer Assistance: cues for hand placement, safety, requires incr time for all aspects Ambulation/Gait Ambulation/Gait Assistance: 4: Min assist;3: Mod assist Ambulation Distance (Feet): 40 Feet Assistive device: Rolling walker Gait Pattern: Step-to pattern;Step-through pattern;Trunk flexed;Decreased stride length; shuffle    Exercises     PT Diagnosis: Difficulty walking  PT Problem List: Decreased strength;Decreased activity tolerance;Decreased balance;Decreased mobility;Decreased knowledge of use of DME PT Treatment Interventions: DME  instruction;Gait training;Functional mobility training;Therapeutic activities;Therapeutic exercise;Patient/family education;Stair training     PT Goals(Current goals can be found in the care plan section) Acute Rehab PT Goals Patient Stated Goal: home  PT Goal Formulation: With patient Time For Goal Achievement: 03/11/13 Potential to Achieve Goals: Good  Visit Information  Last PT Received On: 03/04/13 Assistance Needed: +1 History of Present Illness: HERSHELL BRANDL is an 79 y.o. male.  Pt presents w/ several days of worsening constipation that has progressed into abdominal pain, chills, weakness and poor po intake for the past several days.        Prior [place] expects to be discharged to:: Private residence Living Arrangements: Spouse/significant other Available Help at Discharge: Family;Available 24 hours/day Type of Home: House Home Access: Stairs to enter [employer] of Steps: 2 Entrance Stairs-Rails: None Home Layout: One level Home Equipment: Cane - single point;Walker - 4 wheels;Grab bars - toilet Additional Comments: 3wheeled walker too; uses scooter in stores Prior Function Level of Independence: Independent with assistive device(s) Communication Communication: No difficulties    Cognition  Cognition Arousal/Alertness: Awake/alert Behavior During Therapy: Flat affect Overall Cognitive Status: Within Functional Limits for tasks assessed    Extremity/Trunk Assessment Lower Extremity Assessment Lower Extremity Assessment: Generalized weakness   Balance Static Standing Balance Static Standing - Balance Support: During functional activity;Bilateral upper extremity supported Static Standing - Level of Assistance: 4: Min assist;3: Mod assist  End of Session PT - End of Session Equipment Utilized During Treatment: Gait belt Activity Tolerance: Patient limited by fatigue Patient left: in chair;with call bell/phone within  reach;with family/visitor present  GP     [facility] 03/04/2013, 12:34 PM

## 2013-03-05 ENCOUNTER — Inpatient Hospital Stay (HOSPITAL_COMMUNITY): Payer: Medicare Other

## 2013-03-05 DIAGNOSIS — N201 Calculus of ureter: Secondary | ICD-10-CM | POA: Diagnosis not present

## 2013-03-05 DIAGNOSIS — M25559 Pain in unspecified hip: Secondary | ICD-10-CM | POA: Diagnosis not present

## 2013-03-05 DIAGNOSIS — I10 Essential (primary) hypertension: Secondary | ICD-10-CM | POA: Diagnosis not present

## 2013-03-05 DIAGNOSIS — K5900 Constipation, unspecified: Secondary | ICD-10-CM | POA: Diagnosis not present

## 2013-03-05 DIAGNOSIS — N289 Disorder of kidney and ureter, unspecified: Secondary | ICD-10-CM | POA: Diagnosis not present

## 2013-03-05 LAB — GLUCOSE, CAPILLARY
Glucose-Capillary: 80 mg/dL (ref 70–99)
Glucose-Capillary: 144 mg/dL — ABNORMAL HIGH (ref 70–99)
Glucose-Capillary: 193 mg/dL — ABNORMAL HIGH (ref 70–99)

## 2013-03-05 LAB — CBC
HCT: 31.7 % — ABNORMAL LOW (ref 39.0–52.0)
Hemoglobin: 10.2 g/dL — ABNORMAL LOW (ref 13.0–17.0)
MCV: 91.4 fL (ref 78.0–100.0)
RBC: 3.47 MIL/uL — ABNORMAL LOW (ref 4.22–5.81)
WBC: 12.3 10*3/uL — ABNORMAL HIGH (ref 4.0–10.5)

## 2013-03-05 LAB — BASIC METABOLIC PANEL
CO2: 23 mEq/L (ref 19–32)
Chloride: 104 mEq/L (ref 96–112)
Creatinine, Ser: 3.1 mg/dL — ABNORMAL HIGH (ref 0.50–1.35)
GFR calc Af Amer: 19 mL/min — ABNORMAL LOW (ref >90)
Potassium: 3.9 mEq/L (ref 3.5–5.1)

## 2013-03-05 LAB — URINE CULTURE
Colony Count: NO GROWTH
Culture: NO GROWTH

## 2013-03-05 MED ORDER — METRONIDAZOLE 500 MG PO TABS
500.0000 mg | ORAL_TABLET | Freq: Three times a day (TID) | ORAL | Status: DC
  Administered 2013-03-06 – 2013-03-08 (×7): 500 mg via ORAL
  Filled 2013-03-05 (×11): qty 1

## 2013-03-05 MED ORDER — CIPROFLOXACIN HCL 500 MG PO TABS
500.0000 mg | ORAL_TABLET | Freq: Every day | ORAL | Status: DC
  Administered 2013-03-06 – 2013-03-08 (×3): 500 mg via ORAL
  Filled 2013-03-05 (×4): qty 1

## 2013-03-05 MED ORDER — CIPROFLOXACIN IN D5W 400 MG/200ML IV SOLN: 400.0000 mg | INTRAVENOUS | Status: DC
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
## 2013-03-05 NOTE — Progress Notes (Signed)
Brief Pharmacy Note: Cipro  Day 2 Ciprofloxacin/ Flagyl for enteritis and UTI  Antibiotics requested po:  -Flagyl to $RemoveBe'500mg'WgzeKoTJp$  po q8h -Cipro to $RemoveB'500mg'DNNaZjdp$  po q24 to begin 9/5  Clearance ~ 20 ml/min  Thank you  Minda Ditto PharmD Pager [phone] 03/05/2013, 6:18 PM

## 2013-03-06 ENCOUNTER — Other Ambulatory Visit: Payer: Self-pay | Admitting: Urology

## 2013-03-06 ENCOUNTER — Encounter (HOSPITAL_COMMUNITY): Payer: Self-pay | Admitting: Anesthesiology

## 2013-03-06 ENCOUNTER — Inpatient Hospital Stay (HOSPITAL_COMMUNITY): Payer: Medicare Other

## 2013-03-06 ENCOUNTER — Inpatient Hospital Stay (HOSPITAL_COMMUNITY): Payer: Medicare Other | Admitting: Anesthesiology

## 2013-03-06 ENCOUNTER — Encounter (HOSPITAL_COMMUNITY)
Admission: EM | Disposition: A | Discharge status: Home / Self Care | Payer: Self-pay | Source: Home / Self Care | Attending: Internal Medicine

## 2013-03-06 DIAGNOSIS — N179 Acute kidney failure, unspecified: Secondary | ICD-10-CM | POA: Diagnosis not present

## 2013-03-06 DIAGNOSIS — N390 Urinary tract infection, site not specified: Secondary | ICD-10-CM | POA: Diagnosis not present

## 2013-03-06 DIAGNOSIS — E1142 Type 2 diabetes mellitus with diabetic polyneuropathy: Secondary | ICD-10-CM | POA: Diagnosis not present

## 2013-03-06 DIAGNOSIS — N281 Cyst of kidney, acquired: Secondary | ICD-10-CM | POA: Diagnosis not present

## 2013-03-06 DIAGNOSIS — M25559 Pain in unspecified hip: Secondary | ICD-10-CM | POA: Diagnosis not present

## 2013-03-06 DIAGNOSIS — I959 Hypotension, unspecified: Secondary | ICD-10-CM | POA: Diagnosis not present

## 2013-03-06 DIAGNOSIS — N201 Calculus of ureter: Secondary | ICD-10-CM | POA: Diagnosis not present

## 2013-03-06 DIAGNOSIS — E119 Type 2 diabetes mellitus without complications: Secondary | ICD-10-CM | POA: Diagnosis not present

## 2013-03-06 DIAGNOSIS — N1330 Unspecified hydronephrosis: Secondary | ICD-10-CM | POA: Diagnosis not present

## 2013-03-06 DIAGNOSIS — R109 Unspecified abdominal pain: Secondary | ICD-10-CM | POA: Diagnosis not present

## 2013-03-06 DIAGNOSIS — K5900 Constipation, unspecified: Secondary | ICD-10-CM | POA: Diagnosis not present

## 2013-03-06 DIAGNOSIS — R509 Fever, unspecified: Secondary | ICD-10-CM | POA: Diagnosis not present

## 2013-03-06 DIAGNOSIS — N289 Disorder of kidney and ureter, unspecified: Secondary | ICD-10-CM | POA: Diagnosis not present

## 2013-03-06 DIAGNOSIS — I10 Essential (primary) hypertension: Secondary | ICD-10-CM | POA: Diagnosis not present

## 2013-03-06 HISTORY — PX: CYSTOSCOPY W/ URETERAL STENT PLACEMENT: SHX1429

## 2013-03-06 LAB — BASIC METABOLIC PANEL
BUN: 40 mg/dL — ABNORMAL HIGH (ref 6–23)
Calcium: 8.1 mg/dL — ABNORMAL LOW (ref 8.4–10.5)
Chloride: 105 mEq/L (ref 96–112)
Creatinine, Ser: 2.93 mg/dL — ABNORMAL HIGH (ref 0.50–1.35)
GFR calc Af Amer: 21 mL/min — ABNORMAL LOW (ref >90)
GFR calc non Af Amer: 18 mL/min — ABNORMAL LOW (ref >90)

## 2013-03-06 LAB — CBC
HCT: 31.1 % — ABNORMAL LOW (ref 39.0–52.0)
MCHC: 32.8 g/dL (ref 30.0–36.0)
Platelets: 127 10*3/uL — ABNORMAL LOW (ref 150–400)
RDW: 14.1 % (ref 11.5–15.5)
WBC: 9.9 10*3/uL (ref 4.0–10.5)

## 2013-03-06 LAB — GLUCOSE, CAPILLARY
Glucose-Capillary: 154 mg/dL — ABNORMAL HIGH (ref 70–99)
Glucose-Capillary: 86 mg/dL (ref 70–99)
Glucose-Capillary: 92 mg/dL (ref 70–99)

## 2013-03-06 LAB — SURGICAL PCR SCREEN
MRSA, PCR: NEGATIVE
Staphylococcus aureus: NEGATIVE

## 2013-03-06 SURGERY — CYSTOSCOPY, WITH RETROGRADE PYELOGRAM AND URETERAL STENT INSERTION
Anesthesia: General | Laterality: Left | Wound class: Clean Contaminated

## 2013-03-06 MED ORDER — 0.9 % SODIUM CHLORIDE (POUR BTL) OPTIME
TOPICAL | Status: DC | PRN
  Administered 2013-03-06: 1000 mL

## 2013-03-06 MED ORDER — LIDOCAINE HCL (PF) 2 % IJ SOLN
INTRAMUSCULAR | Status: DC | PRN
  Administered 2013-03-06: 75 mg

## 2013-03-06 MED ORDER — IOHEXOL 300 MG/ML  SOLN
INTRAMUSCULAR | Status: DC | PRN
  Administered 2013-03-06: 6 mL via INTRAVENOUS

## 2013-03-06 MED ORDER — STERILE WATER FOR IRRIGATION IR SOLN
Status: DC | PRN
  Administered 2013-03-06: 3000 mL

## 2013-03-06 MED ORDER — ONDANSETRON HCL 4 MG/2ML IJ SOLN
INTRAMUSCULAR | Status: DC | PRN
  Administered 2013-03-06: 4 mg via INTRAVENOUS

## 2013-03-06 MED ORDER — FENTANYL CITRATE 0.05 MG/ML IJ SOLN: 25.0000 ug | INTRAMUSCULAR | Status: DC | PRN

## 2013-03-06 MED ORDER — LACTATED RINGERS IV SOLN: INTRAVENOUS | Status: DC

## 2013-03-06 MED ORDER — FENTANYL CITRATE 0.05 MG/ML IJ SOLN
INTRAMUSCULAR | Status: DC | PRN
  Administered 2013-03-06 (×3): 25 ug via INTRAVENOUS

## 2013-03-06 MED ORDER — LACTATED RINGERS IV SOLN
INTRAVENOUS | Status: DC | PRN
  Administered 2013-03-06: 16:00:00 via INTRAVENOUS

## 2013-03-06 MED ORDER — LIDOCAINE HCL 2 % EX GEL
CUTANEOUS | Status: AC
  Filled 2013-03-06: qty 10

## 2013-03-06 MED ORDER — CLONIDINE HCL 0.1 MG PO TABS
0.1000 mg | ORAL_TABLET | Freq: Three times a day (TID) | ORAL | Status: DC | PRN
  Filled 2013-03-06 (×2): qty 1

## 2013-03-06 MED ORDER — ISOSORBIDE MONONITRATE ER 30 MG PO TB24
30.0000 mg | ORAL_TABLET | Freq: Every day | ORAL | Status: DC
  Administered 2013-03-06 – 2013-03-09 (×4): 30 mg via ORAL
  Filled 2013-03-06 (×4): qty 1

## 2013-03-06 MED ORDER — PROPOFOL 10 MG/ML IV BOLUS
INTRAVENOUS | Status: DC | PRN
  Administered 2013-03-06: 140 mg via INTRAVENOUS

## 2013-03-06 MED ORDER — EPHEDRINE SULFATE 50 MG/ML IJ SOLN
INTRAMUSCULAR | Status: DC | PRN
  Administered 2013-03-06: 5 mg via INTRAVENOUS
  Administered 2013-03-06: 10 mg via INTRAVENOUS

## 2013-03-06 SURGICAL SUPPLY — 11 items
BAG URO CATCHER STRL LF (DRAPE) ×2 IMPLANT
CATH URET 5FR 28IN OPEN ENDED (CATHETERS) ×2 IMPLANT
CLOTH BEACON ORANGE TIMEOUT ST (SAFETY) ×2 IMPLANT
DRAPE CAMERA CLOSED 9X96 (DRAPES) ×2 IMPLANT
GLOVE SURG SS PI 8.0 STRL IVOR (GLOVE) ×2 IMPLANT
GOWN PREVENTION PLUS XLARGE (GOWN DISPOSABLE) ×2 IMPLANT
GOWN STRL REIN XL XLG (GOWN DISPOSABLE) ×2 IMPLANT
MANIFOLD NEPTUNE II (INSTRUMENTS) ×2 IMPLANT
PACK CYSTO (CUSTOM PROCEDURE TRAY) ×2 IMPLANT
STENT CONTOUR 6FRX26X.038 (STENTS) ×1 IMPLANT
TUBING CONNECTING 10 (TUBING) ×2 IMPLANT

## 2013-03-06 NOTE — Progress Notes (Signed)
Pt c/o worsening LLQ pain around 2240 that had not yet been relieved by 2 Norco and $RemoveB'2mg'UdlvdikQ$  IV Morphine. Pt also feels somewhat distended in the abd and states no BM since coming to hospital. Pt denies any flatus also. Dr. Philip Aspen notified of pt's symptoms. Orders received for abdominal x-rays and a soap suds enema.   At 2330, this RN received a call from Radiology informing that there was a 10mm left ureteral stone found on the CT dated 03/03/13 that had not been previously mentioned in the CT abd report.

## 2013-03-06 NOTE — Progress Notes (Signed)
Pt has been offered soap suds enema a couple times this shift to help move his bowels since he's had no success after using PO laxatives. Pt states he would prefer to receive SS Enema later in the morning. Will pass along to dayshift RN.
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
## 2013-03-06 NOTE — Anesthesia Postprocedure Evaluation (Signed)
  Anesthesia Post-op Note  Patient: Victor Wallace  Procedure(s) Performed: Procedure(s) (LRB): CYSTOSCOPY WITH RETROGRADE PYELOGRAM/URETERAL STENT PLACEMENT (Left)  Patient Location: PACU  Anesthesia Type: General  Level of Consciousness: awake and alert   Airway and Oxygen Therapy: Patient Spontanous Breathing  Post-op Pain: mild  Post-op Assessment: Post-op Vital signs reviewed, Patient's Cardiovascular Status Stable, Respiratory Function Stable, Patent Airway and No signs of Nausea or vomiting  Last Vitals:  Filed Vitals:   03/06/13 1741  BP: 163/79  Pulse: 77  Temp: 36.9 C  Resp: 14    Post-op Vital Signs: stable   Complications: No apparent anesthesia complications

## 2013-03-06 NOTE — Transfer of Care (Signed)
Immediate Anesthesia Transfer of Care Note  Patient: Victor Wallace  Procedure(s) Performed: Procedure(s): CYSTOSCOPY WITH RETROGRADE PYELOGRAM/URETERAL STENT PLACEMENT (Left)  Patient Location: PACU  Anesthesia Type:General  Level of Consciousness: awake, sedated and patient cooperative  Airway & Oxygen Therapy: Patient Spontanous Breathing and Patient connected to face mask oxygen  Post-op Assessment: Report given to PACU RN and Post -op Vital signs reviewed and stable  Post vital signs: Reviewed and stable  Complications: No apparent anesthesia complications

## 2013-03-06 NOTE — Brief Op Note (Signed)
03/03/2013 - 03/06/2013  4:53 PM  PATIENT:  Victor Wallace  79 y.o. male  PRE-OPERATIVE DIAGNOSIS:  Left Ureteral Stone  POST-OPERATIVE DIAGNOSIS:  Left Ureteral Stone  PROCEDURE:  Procedure(s): CYSTOSCOPY WITH RETROGRADE PYELOGRAM/URETERAL STENT PLACEMENT (Left)  SURGEON:  Surgeon(s) and Role:    * Malka So, MD - Primary  PHYSICIAN ASSISTANT:   ASSISTANTS: none   ANESTHESIA:   none  EBL:  Total I/O In: -  Out: 575 [Urine:575]  BLOOD ADMINISTERED:none  DRAINS: left 6x26 JJ stent   LOCAL MEDICATIONS USED:  NONE  SPECIMEN:  No Specimen  DISPOSITION OF SPECIMEN:  N/A  COUNTS:  YES  TOURNIQUET:  * No tourniquets in log *  DICTATION: .Other Dictation: Dictation Number [id number]  PLAN OF CARE: Admit to inpatient   PATIENT DISPOSITION:  PACU - hemodynamically stable.   Delay start of Pharmacological VTE agent (>24hrs) due to surgical blood loss or risk of bleeding: not applicable

## 2013-03-06 NOTE — Consult Note (Signed)
I have reviewed the history and examined the patient and concur with the above information.   He will have a left ureteral stent placed today.   I reviewed the risks of bleeding, infection, ureteral injury, need for secondary procedures, thrombotic events and anesthetic complications.

## 2013-03-06 NOTE — Consult Note (Signed)
Referring Provider: No ref. provider found Primary Care Physician:  Haywood Pao, MD Primary Nephrologist:  Dr. Mercy Moore  Reason for Consultation:   Acute on chronic renal insufficiency. Known Chronic renal failure. Baseline creatinine 2.1. Urology was consulted for a left ureteral stone and hydronephrosis.  HPI: Victor Wallace is  79 y.o. History of significant for renal insufficiency baseline of $RemoveBef'2mg'YtsKgFKLvX$ /dl he also has a history of coraonary artery disease s/p MI, HTN, DM, and GERD who presented on 03/03/13 with fevers, chills, and weakness. He had a leukocytosis with a WBC of 26.5 and was admitted and started on IVF. He was found to have a urine  that was suggestive fo UTI and started on Cipro. A CT scan on 03/03/13 revealed a possible enteritis for which the pt was started on Flagyl.  This also showed a 41mm proximal left ureteral stone with mild hydronephrosis. His renal function has somewhat improved with hydration and is scheduled today for stone removal cystoscopically.  Past Medical History  Diagnosis Date  . MI (myocardial infarction)   . Hypertension   . Diabetes mellitus   . Hypercholesterolemia   . GERD (gastroesophageal reflux disease)     Past Surgical History  Procedure Laterality Date  . Hip surgery    . Tonsillectomy      Prior to Admission medications   Medication Sig Start Date End Date Taking? Authorizing Provider  aspirin EC 81 MG tablet Take 162 mg by mouth daily.   Yes Historical Provider, MD  atorvastatin (LIPITOR) 40 MG tablet Take 40 mg by mouth daily.   Yes Historical Provider, MD  cycloSPORINE (RESTASIS) 0.05 % ophthalmic emulsion Place 2 drops into both eyes 2 (two) times daily.   Yes Historical Provider, MD  fish oil-omega-3 fatty acids 1000 MG capsule Take 1 capsule (1 g total) by mouth daily. Stop taking if having diarrhea 02/05/12  Yes Ripudeep K Rai, MD  insulin aspart (NOVOLOG) 100 UNIT/ML injection Inject 0-15 Units into the skin 3 (three) times daily  with meals.   Yes Historical Provider, MD  insulin detemir (LEVEMIR) 100 UNIT/ML injection Inject 14 Units into the skin at bedtime.   Yes Historical Provider, MD  isosorbide mononitrate (IMDUR) 30 MG 24 hr tablet Take 30 mg by mouth daily.   Yes Historical Provider, MD  linagliptin (TRADJENTA) 5 MG TABS tablet Take 1 tablet (5 mg total) by mouth daily. 02/05/12  Yes Ripudeep Krystal Eaton, MD  Multiple Vitamins-Minerals (MULTIVITAMIN WITH MINERALS) tablet Take 2 tablets by mouth daily.    Yes Historical Provider, MD  Multiple Vitamins-Minerals (PRESERVISION AREDS 2) CAPS Take 1 capsule by mouth 2 (two) times daily.    Yes Historical Provider, MD  omeprazole (PRILOSEC) 20 MG capsule Take 20 mg by mouth every other day.    Yes Historical Provider, MD  polyethylene glycol (MIRALAX / GLYCOLAX) packet Take 17 g by mouth 2 (two) times daily as needed (for constipation).    Yes Historical Provider, MD  Pyridoxine HCl (VITAMIN B-6) 500 MG tablet Take 500 mg by mouth daily.   Yes Historical Provider, MD  timolol (BETIMOL) 0.5 % ophthalmic solution Place 1 drop into the left eye daily.   Yes Historical Provider, MD    Current Facility-Administered Medications  Medication Dose Route Frequency Provider Last Rate Last Dose  . alum & mag hydroxide-simeth (MAALOX/MYLANTA) 200-200-20 MG/5ML suspension 30 mL  30 mL Oral Q6H PRN Velna Hatchet, MD   30 mL at 03/05/13 1910  . aspirin EC tablet 162  mg  162 mg Oral Daily Velna Hatchet, MD   162 mg at 03/05/13 1123  . atorvastatin (LIPITOR) tablet 40 mg  40 mg Oral Daily Velna Hatchet, MD   40 mg at 03/05/13 1732  . ciprofloxacin (CIPRO) tablet 500 mg  500 mg Oral Q breakfast Minda Ditto, RPH   500 mg at 03/06/13 4580  . cycloSPORINE (RESTASIS) 0.05 % ophthalmic emulsion 2 drop  2 drop Both Eyes BID Velna Hatchet, MD   2 drop at 03/06/13 1056  . docusate sodium (COLACE) capsule 100 mg  100 mg Oral BID Velna Hatchet, MD   100 mg at 03/06/13 1051  . enoxaparin (LOVENOX)  injection 30 mg  30 mg Subcutaneous Q24H Velna Hatchet, MD   30 mg at 03/05/13 1123  . HYDROcodone-acetaminophen (NORCO/VICODIN) 5-325 MG per tablet 1-2 tablet  1-2 tablet Oral Q4H PRN Velna Hatchet, MD   2 tablet at 03/05/13 2007  . insulin aspart (novoLOG) injection 0-15 Units  0-15 Units Subcutaneous TID WC Velna Hatchet, MD   3 Units at 03/06/13 [phone]  . insulin aspart (novoLOG) injection 0-5 Units  0-5 Units Subcutaneous QHS Velna Hatchet, MD      . insulin detemir (LEVEMIR) injection 18 Units  18 Units Subcutaneous QHS Velna Hatchet, MD   18 Units at 03/05/13 2313  . isosorbide mononitrate (IMDUR) 24 hr tablet 30 mg  30 mg Oral Daily Velna Hatchet, MD   30 mg at 03/06/13 1053  . linagliptin (TRADJENTA) tablet 5 mg  5 mg Oral Daily Velna Hatchet, MD   5 mg at 03/06/13 1052  . metroNIDAZOLE (FLAGYL) tablet 500 mg  500 mg Oral Q8H Velna Hatchet, MD   500 mg at 03/06/13 0658  . morphine 2 MG/ML injection 2 mg  2 mg Intravenous Q4H PRN Velna Hatchet, MD   2 mg at 03/05/13 2210  . multivitamin with minerals tablet 2 tablet  2 tablet Oral Daily Velna Hatchet, MD   2 tablet at 03/05/13 1354  . omega-3 acid ethyl esters (LOVAZA) capsule 1 g  1 g Oral Daily Velna Hatchet, MD   1 g at 03/06/13 1054  . ondansetron (ZOFRAN) tablet 4 mg  4 mg Oral Q6H PRN Velna Hatchet, MD       Or  . ondansetron (ZOFRAN) injection 4 mg  4 mg Intravenous Q6H PRN Velna Hatchet, MD      . pantoprazole (PROTONIX) EC tablet 40 mg  40 mg Oral Daily Velna Hatchet, MD   40 mg at 03/06/13 1053  . polyethylene glycol (MIRALAX / GLYCOLAX) packet 17 g  17 g Oral Daily Velna Hatchet, MD   17 g at 03/05/13 1122  . pyridOXINE (VITAMIN B-6) tablet 500 mg  500 mg Oral Daily Velna Hatchet, MD   500 mg at 03/06/13 1050  . senna (SENOKOT) tablet 8.6 mg  1 tablet Oral BID Velna Hatchet, MD   8.6 mg at 03/06/13 1052  . timolol (TIMOPTIC) 0.5 % ophthalmic solution 1 drop  1 drop Left Eye Daily Velna Hatchet, MD   1 drop at  03/06/13 1055  . zolpidem (AMBIEN) tablet 5 mg  5 mg Oral QHS PRN Velna Hatchet, MD        Allergies as of 03/03/2013  . (No Known Allergies)    No family history on file.  History   Social History  . Marital Status: Married    Spouse Name: N/A    Number of Children: N/A  . Years of Education:  N/A   Occupational History  . Not on file.   Social History Main Topics  . Smoking status: Never Smoker   . Smokeless tobacco: Not on file  . Alcohol Use: Yes  . Drug Use: No  . Sexual Activity:    Other Topics Concern  . Not on file   Social History Narrative  . No narrative on file    Review of Systems: Gen: +fever, chills, weakness, malaise,  HEENT: No visual complaints, No history of Retinopathy. Normal external appearance No Epistaxis or Sore throat. No sinusitis.   CV: Denies chest pain, angina, palpitations, syncope, orthopnea, PND, peripheral edema, and claudication. Resp: Denies dyspnea at rest, dyspnea with exercise, cough, sputum, wheezing, coughing up blood, and pleurisy. GI: Denies vomiting blood, jaundice, and fecal incontinence.   Denies dysphagia or odynophagia. GU : dysuria and abdominal pain MS: Denies joint pain, limitation of movement, and swelling, stiffness, low back pain, extremity pain. Denies muscle weakness, cramps, atrophy.  No use of non steroidal antiinflammatory drugs. Derm: Denies rash, itching, dry skin, hives, moles, warts, or unhealing ulcers.  Psych: Denies depression, anxiety, memory loss, suicidal ideation, hallucinations, paranoia, and confusion. Heme: Denies bruising, bleeding, and enlarged lymph nodes. Neuro: No headache.  No diplopia. No dysarthria.  No dysphasia.  No history of CVA.  No Seizures. No paresthesias.  No weakness. Endocrine +Diabetes.  No Thyroid disease.  No Adrenal disease.  Physical Exam: Vital signs in last 24 hours: Temp:  [98.7 F (37.1 C)-99.6 F (37.6 C)] 98.7 F (37.1 C) (09/05 1014) Pulse Rate:  [68-77] 72  (09/05 1014) Resp:  [18-20] 18 (09/05 1014) BP: (115-167)/(49-88) 164/88 mmHg (09/05 1014) SpO2:  [96 %-98 %] 96 % (09/05 1014) Weight:  [89.1 kg (196 lb 6.9 oz)-91.4 kg (201 lb 8 oz)] 91.4 kg (201 lb 8 oz) (09/05 0538) Last BM Date: 03/03/13 General:   Alert,  Well-developed, well-nourished, pleasant and cooperative in NAD Head:  Normocephalic and atraumatic. Eyes:  Sclera clear, no icterus.   Conjunctiva pink. Ears:  Normal auditory acuity. Nose:  No deformity, discharge,  or lesions. Mouth:  No deformity or lesions, dentition normal. Neck:  Supple; no masses or thyromegaly. JVP not elevated Lungs:  Clear throughout to auscultation.   No wheezes, crackles, or rhonchi. No acute distress. Heart:  Regular rate and rhythm; no murmurs, clicks, rubs,  or gallops. Abdomen:  Soft, nontender and nondistended. No masses, hepatosplenomegaly or hernias noted. Normal bowel sounds, without guarding, and without rebound.   Msk:  Symmetrical without gross deformities. Normal posture. Pulses:  No carotid, renal, femoral bruits. DP and PT symmetrical and equal Extremities:  Without clubbing or edema. Neurologic:  Alert and  oriented x4;  grossly normal neurologically. Skin:  Intact without significant lesions or rashes. Cervical Nodes:  No significant cervical adenopathy. Psych:  Alert and cooperative. Normal mood and affect.  Intake/Output from previous day: 09/04 0701 - 09/05 0700 In: 2904.2 [I.V.:2904.2] Out: 270 [Urine:270] Intake/Output this shift:    Lab Results:  Recent Labs  03/04/13 0404 03/05/13 0420 03/06/13 0432  WBC 26.5* 12.3* 9.9  HGB 11.1* 10.2* 10.2*  HCT 33.6* 31.7* 31.1*  PLT 154 PLATELET CLUMPS NOTED ON SMEAR, COUNT APPEARS ADEQUATE 127*   BMET  Recent Labs  03/04/13 0404 03/05/13 0420 03/06/13 0432  NA 132* 135 133*  K 4.9 3.9 4.2  CL 100 104 105  CO2 $Re'24 23 21  'WJw$ GLUCOSE 357* 128* 187*  BUN 41* 47* 40*  CREATININE 2.79* 3.10* 2.93*  CALCIUM 8.3* 8.1* 8.1*    LFT  Recent Labs  03/03/13 1814  PROT 6.6  ALBUMIN 3.3*  AST 29  ALT 19  ALKPHOS 98  BILITOT 0.8   PT/INR No results found for this basename: LABPROT, INR,  in the last 72 hours Hepatitis Panel No results found for this basename: HEPBSAG, HCVAB, HEPAIGM, HEPBIGM,  in the last 72 hours  Studies/Results: US Renal  03/06/2013   *RADIOLOGY REPORT*  Clinical Data: Follow-up left-sided hydronephrosis.  RENAL/URINARY TRACT ULTRASOUND COMPLETE  Comparison:  CT scan 03/03/2013.  Findings:  Right Kidney:  11.5 cm in length.  Normal renal cortical echogenicity and mild renal cortical thinning.  A mid pole cyst is noted.  No hydronephrosis.  Left Kidney:  15.1 cm in length.  Mild hydronephrosis is present. There is a 6 cm upper pole cyst.  Bladder:  The right ureteral jet is noted.  No left ureteral jet is visualized.  No bladder wall thickening or mass.  Additional findings:  A right pleural effusion is noted.  Calcified granulomas are noted in the spleen.  IMPRESSION:  1.  Persistent mild left-sided hydronephrosis likely due to the left ureteral calculus seen on the prior CT scan. 2.  Bilateral renal cysts.   Original Report Authenticated By: Marijo Sanes, M.D.   Dg Abd Acute W/chest  03/05/2013   *RADIOLOGY REPORT*  Clinical Data: Worsening abdominal pain  ACUTE ABDOMEN SERIES (ABDOMEN 2 VIEW & CHEST 1 VIEW)  Comparison: CT abdomen pelvis 03/03/2013  Findings: Mild cardiomegaly.  Normal pulmonary vascularity.  Lungs are clear.  There is no contrast material in the colon from the CT examination 2 days ago.  There is mild distention of the colon without evidence of obstruction.  Oral contrast is seen in the rectum.  No dilated loops of small bowel.  No radiopaque urinary tract calculi are seen, but evaluation is markedly limited by colonic contrast.  No free intraperitoneal air is seen.  IMPRESSION:  1.  Nonobstructive bowel gas pattern.  There is mild distention of the colon diffusely suggesting mild  colonic ileus. 2.  Oral contrast material is seen in the colon from recent CT. 3.  The proximal left ureteral stone seen on the CT stone study 03/03/2013 is not visible on today's radiographs, and may be obscured by bowel contrast.  Findings discussed with the patient's nurse, Lattie Haw, 11:30 p.m. 03/05/2013.   Original Report Authenticated By: Curlene Dolphin, M.D.    Assessment/Plan:  Chronic renal failure secondary to diabetes and HTN. Followed by Dr Mercy Moore.  Acute non oliguric renal failure probably multifactorial. No ACE inhibitors or ARB. No contrast or antibiotics or any other identifiable nephrotoxins. Appears to possible be hemodynamically mediated and possibly exacerbated by obstruction.  HTN controlled blood pressure  Renal Calculi and hydronephrosis status post removal  Dm2 Insulin and oral agents  Bones know secondary hyperparathyroidism will hold vitamin D for now  Antibiotics  Cipro and Metronidazole appropriately adjusted  Anemia stable will follow   LOS: 3 Jaquayla Hege W $RemoveBefor'@TODAY'bUqNSOqzYXeO$ '@11'$ :29 AM

## 2013-03-06 NOTE — Consult Note (Signed)
Urology Consult   Physician requesting consult: Holwerda  Reason for consult: 35mm left proximal ureteral stone with mild hydronephrosis  History of Present Illness: Victor Wallace is a 79 y.o. cauc male with PMH significant for renal insufficiency, MI, HTN, DM, and GERD who presented on 03/03/13 with c/o several days of worsening constipation, abdominal pain, fevers, chills, and weakness.  His Cr was elevated and he had a leukocytosis with a WBC of 26.5.  He was admitted and started on IVF.  His UA was noted to be positive and he was started on Cipro (urine culture was not obtained until after the pt had received several doses of ABx and has returned no growth). CT scan on 03/03/13 revealed a possible enteritis for which the pt was started on Flagyl.  This also showed a 79mm proximal left ureteral stone with mild hydro which was confirmed/unchanged on RUS on 03/06/13.  Pt also has bilateral renal cysts.    His Cr has trended down with IVF and his WBC has normalized on IV Abx.  Last night the pt developed left flank and low back discomfort in addition to his persistent abdominal pain.  Urology consult was obtained for treatment of left ureteral stone and hydro.  He denies a history of voiding or storage urinary symptoms, hematuria, STDs, urolithiasis, GU malignancy/trauma/surgery.  He was treated for one other uncomplicated UTI last summer.  He is currently resting comfortably.  He denies F/C, HA, CP, SOB, N/V, diarrhea, dysuria, difficulty voiding, and hematuria. He does still have mild abd and low back discomfort.    Past Medical History  Diagnosis Date  . MI (myocardial infarction)   . Hypertension   . Diabetes mellitus   . Hypercholesterolemia   . GERD (gastroesophageal reflux disease)     Past Surgical History  Procedure Laterality Date  . Hip surgery    . Tonsillectomy      Current Hospital Medications:  Home Meds:    Medication List    ASK your doctor about these medications        aspirin EC 81 MG tablet  Take 162 mg by mouth daily.     atorvastatin 40 MG tablet  Commonly known as:  LIPITOR  Take 40 mg by mouth daily.     cycloSPORINE 0.05 % ophthalmic emulsion  Commonly known as:  RESTASIS  Place 2 drops into both eyes 2 (two) times daily.     fish oil-omega-3 fatty acids 1000 MG capsule  Take 1 capsule (1 g total) by mouth daily. Stop taking if having diarrhea     insulin aspart 100 UNIT/ML injection  Commonly known as:  novoLOG  Inject 0-15 Units into the skin 3 (three) times daily with meals.     insulin detemir 100 UNIT/ML injection  Commonly known as:  LEVEMIR  Inject 14 Units into the skin at bedtime.     isosorbide mononitrate 30 MG 24 hr tablet  Commonly known as:  IMDUR  Take 30 mg by mouth daily.     linagliptin 5 MG Tabs tablet  Commonly known as:  TRADJENTA  Take 1 tablet (5 mg total) by mouth daily.     multivitamin with minerals tablet  Take 2 tablets by mouth daily.     PRESERVISION AREDS 2 Caps  Take 1 capsule by mouth 2 (two) times daily.     omeprazole 20 MG capsule  Commonly known as:  PRILOSEC  Take 20 mg by mouth every other day.  polyethylene glycol packet  Commonly known as:  MIRALAX / GLYCOLAX  Take 17 g by mouth 2 (two) times daily as needed (for constipation).     timolol 0.5 % ophthalmic solution  Commonly known as:  BETIMOL  Place 1 drop into the left eye daily.     vitamin B-6 500 MG tablet  Take 500 mg by mouth daily.        Scheduled Meds: . aspirin EC  162 mg Oral Daily  . atorvastatin  40 mg Oral Daily  . ciprofloxacin  500 mg Oral Q breakfast  . cycloSPORINE  2 drop Both Eyes BID  . docusate sodium  100 mg Oral BID  . enoxaparin (LOVENOX) injection  30 mg Subcutaneous Q24H  . insulin aspart  0-15 Units Subcutaneous TID WC  . insulin aspart  0-5 Units Subcutaneous QHS  . insulin detemir  18 Units Subcutaneous QHS  . isosorbide mononitrate  30 mg Oral Daily  . linagliptin  5 mg Oral Daily   . metroNIDAZOLE  500 mg Oral Q8H  . multivitamin with minerals  2 tablet Oral Daily  . omega-3 acid ethyl esters  1 g Oral Daily  . pantoprazole  40 mg Oral Daily  . polyethylene glycol  17 g Oral Daily  . vitamin B-6  500 mg Oral Daily  . senna  1 tablet Oral BID  . timolol  1 drop Left Eye Daily   Continuous Infusions:  PRN Meds:.alum & mag hydroxide-simeth, HYDROcodone-acetaminophen, morphine injection, ondansetron (ZOFRAN) IV, ondansetron, zolpidem  Allergies: No Known Allergies  No family history on file.  Social History:  reports that he has never smoked. He does not have any smokeless tobacco history on file. He reports that  drinks alcohol. He reports that he does not use illicit drugs.  ROS: A complete review of systems was performed.  All systems are negative except for pertinent findings as noted.  Physical Exam:  Vital signs in last 24 hours: Temp:  [97.6 F (36.4 C)-99.6 F (37.6 C)] 98.7 F (37.1 C) (09/05 1014) Pulse Rate:  [68-77] 72 (09/05 1014) Resp:  [18-22] 18 (09/05 1014) BP: (115-167)/(49-88) 164/88 mmHg (09/05 1014) SpO2:  [96 %-99 %] 96 % (09/05 1014) Weight:  [89.1 kg (196 lb 6.9 oz)-91.4 kg (201 lb 8 oz)] 91.4 kg (201 lb 8 oz) (09/05 0538) Constitutional:  Alert and oriented, No acute distress Cardiovascular: Regular rate and rhythm Respiratory: Normal respiratory effort, Lungs clear bilaterally GI: Abdomen is soft, nontender, nondistended, no abdominal masses GU: No CVA tenderness Lymphatic: No lymphadenopathy Neurologic: Grossly intact, no focal deficits Psychiatric: Normal mood and affect  Laboratory Data:   Recent Labs  03/04/13 0404 03/05/13 0420 03/06/13 0432  WBC 26.5* 12.3* 9.9  HGB 11.1* 10.2* 10.2*  HCT 33.6* 31.7* 31.1*  PLT 154 PLATELET CLUMPS NOTED ON SMEAR, COUNT APPEARS ADEQUATE 127*     Recent Labs  03/03/13 1814 03/04/13 0404 03/05/13 0420 03/06/13 0432  NA 133* 132* 135 133*  K 4.3 4.9 3.9 4.2  CL 99 100 104  105  GLUCOSE 325* 357* 128* 187*  BUN 38* 41* 47* 40*  CALCIUM 9.0 8.3* 8.1* 8.1*  CREATININE 2.52* 2.79* 3.10* 2.93*     Results for orders placed during the hospital encounter of 03/03/13 (from the past 24 hour(s))  GLUCOSE, CAPILLARY     Status: Abnormal   Collection Time    03/05/13 11:26 AM      Result Value Range   Glucose-Capillary 144 (*)  70 - 99 mg/dL   Comment 1 Notify RN    GLUCOSE, CAPILLARY     Status: Abnormal   Collection Time    03/05/13  4:30 PM      Result Value Range   Glucose-Capillary 193 (*) 70 - 99 mg/dL   Comment 1 Notify RN    GLUCOSE, CAPILLARY     Status: Abnormal   Collection Time    03/05/13  9:50 PM      Result Value Range   Glucose-Capillary 166 (*) 70 - 99 mg/dL  BASIC METABOLIC PANEL     Status: Abnormal   Collection Time    03/06/13  4:32 AM      Result Value Range   Sodium 133 (*) 135 - 145 mEq/L   Potassium 4.2  3.5 - 5.1 mEq/L   Chloride 105  96 - 112 mEq/L   CO2 21  19 - 32 mEq/L   Glucose, Bld 187 (*) 70 - 99 mg/dL   BUN 40 (*) 6 - 23 mg/dL   Creatinine, Ser 2.93 (*) 0.50 - 1.35 mg/dL   Calcium 8.1 (*) 8.4 - 10.5 mg/dL   GFR calc non Af Amer 18 (*) >90 mL/min   GFR calc Af Amer 21 (*) >90 mL/min  CBC     Status: Abnormal   Collection Time    03/06/13  4:32 AM      Result Value Range   WBC 9.9  4.0 - 10.5 K/uL   RBC 3.45 (*) 4.22 - 5.81 MIL/uL   Hemoglobin 10.2 (*) 13.0 - 17.0 g/dL   HCT 31.1 (*) 39.0 - 52.0 %   MCV 90.1  78.0 - 100.0 fL   MCH 29.6  26.0 - 34.0 pg   MCHC 32.8  30.0 - 36.0 g/dL   RDW 14.1  11.5 - 15.5 %   Platelets 127 (*) 150 - 400 K/uL  GLUCOSE, CAPILLARY     Status: Abnormal   Collection Time    03/06/13  7:29 AM      Result Value Range   Glucose-Capillary 154 (*) 70 - 99 mg/dL   Comment 1 Notify RN     Recent Results (from the past 240 hour(s))  URINE CULTURE     Status: None   Collection Time    03/04/13  5:30 PM      Result Value Range Status   Specimen Description URINE, CLEAN CATCH   Final    Special Requests cipro and flagyl   Final   Culture  Setup Time     Final   Value: 03/04/2013 22:17     Performed at [place]     Final   Value: NO GROWTH     Performed at [facility]   Culture     Final   Value: NO GROWTH     Performed at [facility]   Report Status 03/05/2013 FINAL   Final    Renal Function:  Recent Labs  03/03/13 1814 03/04/13 0404 03/05/13 0420 03/06/13 0432  CREATININE 2.52* 2.79* 3.10* 2.93*   Estimated Creatinine Clearance: 21 ml/min (by C-G formula based on Cr of 2.93).  Radiologic Imaging:  Addendum    Sheppard Evens, MD Thu Mar 05, 2013 11:36:49 PM EDT       **ADDENDUM** CREATED: 03/05/2013 23:33:12  CT abdomen pelvis performed 03/03/2013 was reviewed by myself today  while interpreting an acute abdominal series performed 03/05/2013.  It is noted  that a 5 mm proximal left ureteral stone seen on this  CT results in proximal mild left hydroureteronephrosis. This  finding was called to the patient's nurse, Lattie Haw, at 11:30 p.m.  03/03/2013. She will relay this information to the physician  taking care of the patient.  **END ADDENDUM** SIGNED BY: Curlene Dolphin, M.D.      Study Result    *RADIOLOGY REPORT*  Clinical Data: Vomiting. Abdominal pain. Constipation.  CT ABDOMEN AND PELVIS WITHOUT CONTRAST  Technique: Multidetector CT imaging of the abdomen and pelvis was  performed following the standard protocol without intravenous  contrast.  Comparison: No priors.  Findings:  Lung Bases: Scarring or subsegmental atelectasis in the right lower  lobe posteriorly. Calcifications of the aortic valve.  Abdomen/Pelvis: Multiple calcifications within the liver and  spleen, compatible with calcified granulomas. Subcentimeter low  attenuation lesion in segment 4A of the liver is incompletely  characterized on today's noncontrast CT examination. The  unenhanced appearance of the gallbladder and bilateral  adrenal  glands is unremarkable. In the head of the pancreas there is a  poorly defined 2.4 x 1.6 cm low attenuation lesion which is  incompletely characterized. 2.1 x 2.8 cm low attenuation lesion in  the anterior aspect of the spleen is also incompletely  characterized. A 3.4 cm intermediate attenuation (22 HU) lesion  extending exophytically off the medial aspect of the interpolar  region of the right kidney, with additional subcentimeter low  attenuation lesion immediately above that, both incompletely  characterized. Large exophytic rim calcified 6.6 x 4.6 cm low  attenuation lesion extending off the interpolar region of the left  kidney posteriorly.  Extensive atherosclerosis of the abdominal and pelvic vasculature,  without evidence of frank aneurysm. No significant volume of  ascites. No pneumoperitoneum. No pathologic distension of small  bowel. No definite pathologic lymphadenopathy identified within  the abdomen or pelvis on today's noncontrast CT examination.  Normal appendix. The urinary bladder is unremarkable in  appearance. There is some thickening of some small bowel loops,  most pronounced in the proximal jejunum, which could suggest  inflammation.  Musculoskeletal: Old healed fractures of the inferior pubic rami  bilaterally. Status post right total hip arthroplasty. There are  no aggressive appearing lytic or blastic lesions noted in the  visualized portions of the skeleton.  IMPRESSION:  1. Mild thickening of the proximal small bowel in the region of  the proximal jejunum. This is nonspecific, but could suggest  infection or inflammation (i.e., enteritis). Clinical correlation  is recommended.  2. 2.4 x 1.6 cm low attenuation lesion in the head of the  pancreas, low attenuation splenic lesion, and numerous bilateral  renal lesions (two of which appeared complex) are all incompletely  characterized on today's noncontrast CT examination. Although these  may simply  all represent benign lesions such as cysts, further  evaluation of the abdomen with non emergent MRI with and without IV  gadolinium is recommended to fully characterize these lesions, as  any one of these could potentially be neoplastic.  3. Extensive atherosclerosis.  4. Sequelae of old granulomatous disease with numerous calcified  granulomas in the liver and spleen.  5. Compression fracture at L1 with approximately 20% loss of  anterior vertebral body height which appears old.  6. Additional incidental findings, as above.  Original Report Authenticated By: Vinnie Langton, M.D.    US Renal  03/06/2013   *RADIOLOGY REPORT*  Clinical Data: Follow-up left-sided hydronephrosis.  RENAL/URINARY TRACT ULTRASOUND COMPLETE  Comparison:  CT scan 03/03/2013.  Findings:  Right Kidney:  11.5 cm in length.  Normal renal cortical echogenicity and mild renal cortical thinning.  A mid pole cyst is noted.  No hydronephrosis.  Left Kidney:  15.1 cm in length.  Mild hydronephrosis is present. There is a 6 cm upper pole cyst.  Bladder:  The right ureteral jet is noted.  No left ureteral jet is visualized.  No bladder wall thickening or mass.  Additional findings:  A right pleural effusion is noted.  Calcified granulomas are noted in the spleen.  IMPRESSION:  1.  Persistent mild left-sided hydronephrosis likely due to the left ureteral calculus seen on the prior CT scan. 2.  Bilateral renal cysts.   Original Report Authenticated By: Marijo Sanes, M.D.   Dg Abd Acute W/chest  03/05/2013   *RADIOLOGY REPORT*  Clinical Data: Worsening abdominal pain  ACUTE ABDOMEN SERIES (ABDOMEN 2 VIEW & CHEST 1 VIEW)  Comparison: CT abdomen pelvis 03/03/2013  Findings: Mild cardiomegaly.  Normal pulmonary vascularity.  Lungs are clear.  There is no contrast material in the colon from the CT examination 2 days ago.  There is mild distention of the colon without evidence of obstruction.  Oral contrast is seen in the rectum.  No dilated  loops of small bowel.  No radiopaque urinary tract calculi are seen, but evaluation is markedly limited by colonic contrast.  No free intraperitoneal air is seen.  IMPRESSION:  1.  Nonobstructive bowel gas pattern.  There is mild distention of the colon diffusely suggesting mild colonic ileus. 2.  Oral contrast material is seen in the colon from recent CT. 3.  The proximal left ureteral stone seen on the CT stone study 03/03/2013 is not visible on today's radiographs, and may be obscured by bowel contrast.  Findings discussed with the patient's nurse, Lattie Haw, 11:30 p.m. 03/05/2013.   Original Report Authenticated By: Curlene Dolphin, M.D.    Impression/Recommendation  Proximal 18mm left ureteral stone with mild hydroureteronephrosis in pt with renal isufficiency-- pt will have cysto, possible bilateral retrogrades, left ureteral stenting, and possible left ureteral stone manipulation.    Pt should have PVR checked to ensure he is emptying his bladder completely as he has had several UTIs.  These are likely due to dehydration but for completeness this should be evaled.  This can be done as an outpt.    Marcie Bal 03/06/2013, 10:50 AM

## 2013-03-06 NOTE — Op Note (Signed)
[name], [name] NO.:  [id number]  MEDICAL RECORD NO.:  [id number]  LOCATION:  3382                         FACILITY:  [facility]  PHYSICIAN:  Marshall Cork. Jeffie Pollock, M.D.    DATE OF BIRTH:  07-18-1924  DATE OF PROCEDURE:  03/06/2013 DATE OF DISCHARGE:                              OPERATIVE REPORT   PROCEDURES:  Cystoscopy, left retrograde pyelogram with placement of left double-J stent.  PREOPERATIVE DIAGNOSIS:  Obstructing left ureteral stone.  POSTOPERATIVE DIAGNOSIS:  Obstructing left ureteral stone with some tortuosity and narrowing of the left proximal ureter below the stent.  SURGEON:  Marshall Cork. Jeffie Pollock, M.D.  ANESTHESIA:  General.  SPECIMENS:  None.  DRAINS:  A 6-French 26 cm left double-J stent.  BLOOD LOSS:  Minimal.  COMPLICATIONS:  None.  INDICATIONS:  [name] is an 79 year old white male who is presenting with fever and flank pain, and was found to have what was felt to be a 5-mm stone in the left proximal ureter.  On my review, it seems to be a bit larger approximately 6 mm.  It was felt that stenting was indicated.  FINDINGS AND PROCEDURE:  He was taken to the operating room where general anesthetic was induced.  He had been on Cipro and Flagyl preoperatively.  He was fitted with PAS hose and placed in lithotomy position.  His perineum and genitalia were prepped with Betadine solution.  He was draped in usual sterile fashion.  Cystoscopy was performed using a 22-French scope and 12-degree lens. Examination revealed a normal urethra.  The external sphincter was intact.  The prostatic urethra had bilobar hyperplasia with some obstruction.  Examination of bladder revealed mild trabeculation.  The urine was somewhat turbid.  No tumors or stones were noted.  Ureteral orifices were unremarkable.  The left ureteral orifice was cannulated with a guidewire, which was passed to just below the stone, but would not progress.  A 5-French open-end  catheter was then inserted over the wire to stiffen it, but it would not pass as well.  The wire was backed out, and retrograde pyelogram was performed.  Retrograde pyelogram revealed an S-shaped tortuosity below the stone, slightly with some swelling and slight redness of the ureter, but contrast did flow above the stone.  At this point, a Glidewire was passed through the open-end catheter, and I was able to negotiate this to the kidney.  The stone actually bounced back up into the kidney upon passage of the wire.  The open-end catheter was then advanced over the wire to the kidney and the Glidewire was removed.  The Sensor wire was replaced and the open-end catheter was removed.  A 6-French 26 cm double-J stent was then advanced to the kidney under fluoroscopic guidance.  The wire was removed leaving good coil in the kidney, a good coil in the bladder.  The bladder was then drained.  The patient was taken down from lithotomy position.  His anesthetic was reversed.  He was moved to recovery room in stable condition.  There were no complications.     Marshall Cork. Jeffie Pollock, M.D.     JJW/MEDQ  D:  03/06/2013  T:  03/06/2013  Job:  [id number] 

## 2013-03-06 NOTE — Anesthesia Preprocedure Evaluation (Signed)
Anesthesia Evaluation  Patient identified by MRN, date of birth, ID band Patient awake    Reviewed: Allergy & Precautions, H&P , NPO status , Patient's Chart, lab work & pertinent test results  Airway Mallampati: II TM Distance: >3 FB Neck ROM: Full    Dental no notable dental hx.    Pulmonary neg pulmonary ROS,  breath sounds clear to auscultation  Pulmonary exam normal       Cardiovascular hypertension, Pt. on medications + CAD and + Past MI Rhythm:Regular Rate:Normal  MI 2008   Neuro/Psych negative neurological ROS  negative psych ROS   GI/Hepatic negative GI ROS, Neg liver ROS, GERD-  Medicated,  Endo/Other  negative endocrine ROSdiabetes, Type 1, Insulin Dependent  Renal/GU Renal InsufficiencyRenal diseaseCr 2.93 K 4.2  negative genitourinary   Musculoskeletal negative musculoskeletal ROS (+)   Abdominal   Peds negative pediatric ROS (+)  Hematology negative hematology ROS (+)   Anesthesia Other Findings   Reproductive/Obstetrics negative OB ROS                           Anesthesia Physical Anesthesia Plan  ASA: III  Anesthesia Plan: General   Post-op Pain Management:    Induction: Intravenous  Airway Management Planned: LMA  Additional Equipment:   Intra-op Plan:   Post-operative Plan: Extubation in OR  Informed Consent: I have reviewed the patients History and Physical, chart, labs and discussed the procedure including the risks, benefits and alternatives for the proposed anesthesia with the patient or authorized representative who has indicated his/her understanding and acceptance.   Dental advisory given  Plan Discussed with: CRNA  Anesthesia Plan Comments:         Anesthesia Quick Evaluation

## 2013-03-07 ENCOUNTER — Inpatient Hospital Stay (HOSPITAL_COMMUNITY): Payer: Medicare Other

## 2013-03-07 DIAGNOSIS — N201 Calculus of ureter: Secondary | ICD-10-CM | POA: Diagnosis not present

## 2013-03-07 DIAGNOSIS — N19 Unspecified kidney failure: Secondary | ICD-10-CM | POA: Diagnosis not present

## 2013-03-07 DIAGNOSIS — E875 Hyperkalemia: Secondary | ICD-10-CM | POA: Diagnosis not present

## 2013-03-07 DIAGNOSIS — R509 Fever, unspecified: Secondary | ICD-10-CM | POA: Diagnosis not present

## 2013-03-07 DIAGNOSIS — E872 Acidosis: Secondary | ICD-10-CM | POA: Diagnosis not present

## 2013-03-07 DIAGNOSIS — R109 Unspecified abdominal pain: Secondary | ICD-10-CM | POA: Diagnosis not present

## 2013-03-07 LAB — GLUCOSE, CAPILLARY: Glucose-Capillary: 146 mg/dL — ABNORMAL HIGH (ref 70–99)

## 2013-03-07 LAB — CBC
Hemoglobin: 9.8 g/dL — ABNORMAL LOW (ref 13.0–17.0)
MCH: 29 pg (ref 26.0–34.0)
MCHC: 32.3 g/dL (ref 30.0–36.0)
MCV: 89.6 fL (ref 78.0–100.0)
RBC: 3.38 MIL/uL — ABNORMAL LOW (ref 4.22–5.81)

## 2013-03-07 LAB — BASIC METABOLIC PANEL
BUN: 32 mg/dL — ABNORMAL HIGH (ref 6–23)
CO2: 25 mEq/L (ref 19–32)
GFR calc non Af Amer: 25 mL/min — ABNORMAL LOW (ref >90)
Glucose, Bld: 120 mg/dL — ABNORMAL HIGH (ref 70–99)
Potassium: 3.5 mEq/L (ref 3.5–5.1)

## 2013-03-07 MED ORDER — SACCHAROMYCES BOULARDII 250 MG PO CAPS
250.0000 mg | ORAL_CAPSULE | Freq: Two times a day (BID) | ORAL | Status: DC
  Administered 2013-03-07 – 2013-03-09 (×5): 250 mg via ORAL
  Filled 2013-03-07 (×6): qty 1

## 2013-03-07 MED ORDER — INSULIN DETEMIR 100 UNIT/ML ~~LOC~~ SOLN
12.0000 [IU] | Freq: Every day | SUBCUTANEOUS | Status: DC
  Administered 2013-03-07 – 2013-03-08 (×2): 12 [IU] via SUBCUTANEOUS
  Filled 2013-03-07 (×2): qty 0.12

## 2013-03-07 NOTE — Progress Notes (Signed)
[facility] ROUNDING NOTE   Subjective:   Interval History: appears much improved ambulating with physical therapy  Objective:  Vital signs in last 24 hours:  Temp:  [97.7 F (36.5 C)-99.1 F (37.3 C)] 98.1 F (36.7 C) (09/06 1416) Pulse Rate:  [64-97] 66 (09/06 1416) Resp:  [11-20] 18 (09/06 1416) BP: (140-186)/(74-89) 150/79 mmHg (09/06 1416) SpO2:  [94 %-100 %] 96 % (09/06 1416) Weight:  [90.719 kg (200 lb)] 90.719 kg (200 lb) (09/06 0609)  Weight change: 1.619 kg (3 lb 9.1 oz) Filed Weights   03/05/13 1500 03/06/13 0538 03/07/13 0609  Weight: 89.1 kg (196 lb 6.9 oz) 91.4 kg (201 lb 8 oz) 90.719 kg (200 lb)    Intake/Output: I/O last 3 completed shifts: In: 3804.2 [I.V.:3804.2] Out: 1525 [Urine:1525]   Intake/Output this shift:  Total I/O In: -  Out: 350 [Urine:350]  CVS- RRR RS- CTA ABD- BS present soft non-distended EXT- no edema   Basic Metabolic Panel:  Recent Labs Lab 03/03/13 1814 03/04/13 0404 03/05/13 0420 03/06/13 0432 03/07/13 0549  NA 133* 132* 135 133* 133*  K 4.3 4.9 3.9 4.2 3.5  CL 99 100 104 105 102  CO2 $Re'22 24 23 21 25  'SwA$ GLUCOSE 325* 357* 128* 187* 120*  BUN 38* 41* 47* 40* 32*  CREATININE 2.52* 2.79* 3.10* 2.93* 2.21*  CALCIUM 9.0 8.3* 8.1* 8.1* 8.5    Liver Function Tests:  Recent Labs Lab 03/03/13 1814  AST 29  ALT 19  ALKPHOS 98  BILITOT 0.8  PROT 6.6  ALBUMIN 3.3*    Recent Labs Lab 03/03/13 1814  LIPASE 20   No results found for this basename: AMMONIA,  in the last 168 hours  CBC:  Recent Labs Lab 03/04/13 0404 03/05/13 0420 03/06/13 0432 03/07/13 0549  WBC 26.5* 12.3* 9.9 7.1  HGB 11.1* 10.2* 10.2* 9.8*  HCT 33.6* 31.7* 31.1* 30.3*  MCV 90.3 91.4 90.1 89.6  PLT 154 PLATELET CLUMPS NOTED ON SMEAR, COUNT APPEARS ADEQUATE 127* 128*    Cardiac Enzymes:  Recent Labs Lab 03/03/13 1828  TROPONINI <0.30    BNP: No components found with this basename: POCBNP,   CBG:  Recent  Labs Lab 03/06/13 0729 03/06/13 1209 03/06/13 1620 03/06/13 1701 03/07/13 1140  GLUCAP 154* 86 92 101* 146*    Microbiology: Results for orders placed during the hospital encounter of 03/03/13  URINE CULTURE     Status: None   Collection Time    03/04/13  5:30 PM      Result Value Range Status   Specimen Description URINE, CLEAN CATCH   Final   Special Requests cipro and flagyl   Final   Culture  Setup Time     Final   Value: 03/04/2013 22:17     Performed at [facility]     Final   Value: NO GROWTH     Performed at [facility]   Culture     Final   Value: NO GROWTH     Performed at [facility]   Report Status 03/05/2013 FINAL   Final  SURGICAL PCR SCREEN     Status: None   Collection Time    03/06/13 10:02 AM      Result Value Range Status   MRSA, PCR NEGATIVE  NEGATIVE Final   Staphylococcus aureus NEGATIVE  NEGATIVE Final   Comment:            The Xpert SA Assay (  FDA     approved for NASAL specimens     in patients over 44 years of age),     is one component of     a comprehensive surveillance     program.  Test performance has     been validated by [employer] for patients greater     than or equal to 53 year old.     It is not intended     to diagnose infection nor to     guide or monitor treatment.    Coagulation Studies: No results found for this basename: LABPROT, INR,  in the last 72 hours  Urinalysis: No results found for this basename: COLORURINE, APPERANCEUR, LABSPEC, PHURINE, GLUCOSEU, HGBUR, BILIRUBINUR, KETONESUR, PROTEINUR, UROBILINOGEN, NITRITE, LEUKOCYTESUR,  in the last 72 hours    Imaging: Dg Abd 1 View  03/07/2013   *RADIOLOGY REPORT*  Clinical Data: Left-sided ureteral stone  ABDOMEN - 1 VIEW  Comparison: 03/05/2013; CT abdomen pelvis - 03/03/2013  Findings:  The patient has undergone placement of a left-sided double J ureteral stent with superior coil overlying dissected location of the left  renal pelvis and inferior coil overlying expected location of the urinary bladder.  No definite opacities are seen along the course of the left-sided double J ureteral stent.  There is a punctate (approximately 5 mm) opacity overlying the expected location of the inferior pole of the left kidney.  Several phleboliths again overlie the lower pelvis.  Prostatic calcifications.  Residual contrast is seen within the colon from prior abdominal CT. No evidence of enteric obstruction.  Post right total hip replacement, incompletely imaged.  Lumbar spine degenerative change.  IMPRESSION: 1. Post left-sided double J ureteral stent placement.  No definite opacities overlying the course of the left-sided double J ureteral stent.  2.  Punctate opacity overlying the expected location of the inferior pole of the left kidney may represent the previously identified left sided ureteral stone, now within the inferior aspect of the left renal collecting system, versus radiopaque debris within the overlying colon.   Original Report Authenticated By: Jake Seats, MD   US Renal  03/06/2013   *RADIOLOGY REPORT*  Clinical Data: Follow-up left-sided hydronephrosis.  RENAL/URINARY TRACT ULTRASOUND COMPLETE  Comparison:  CT scan 03/03/2013.  Findings:  Right Kidney:  11.5 cm in length.  Normal renal cortical echogenicity and mild renal cortical thinning.  A mid pole cyst is noted.  No hydronephrosis.  Left Kidney:  15.1 cm in length.  Mild hydronephrosis is present. There is a 6 cm upper pole cyst.  Bladder:  The right ureteral jet is noted.  No left ureteral jet is visualized.  No bladder wall thickening or mass.  Additional findings:  A right pleural effusion is noted.  Calcified granulomas are noted in the spleen.  IMPRESSION:  1.  Persistent mild left-sided hydronephrosis likely due to the left ureteral calculus seen on the prior CT scan. 2.  Bilateral renal cysts.   Original Report Authenticated By: Marijo Sanes, M.D.   Dg Abd Acute  W/chest  03/05/2013   *RADIOLOGY REPORT*  Clinical Data: Worsening abdominal pain  ACUTE ABDOMEN SERIES (ABDOMEN 2 VIEW & CHEST 1 VIEW)  Comparison: CT abdomen pelvis 03/03/2013  Findings: Mild cardiomegaly.  Normal pulmonary vascularity.  Lungs are clear.  There is no contrast material in the colon from the CT examination 2 days ago.  There is mild distention of the colon without evidence of obstruction.  Oral  contrast is seen in the rectum.  No dilated loops of small bowel.  No radiopaque urinary tract calculi are seen, but evaluation is markedly limited by colonic contrast.  No free intraperitoneal air is seen.  IMPRESSION:  1.  Nonobstructive bowel gas pattern.  There is mild distention of the colon diffusely suggesting mild colonic ileus. 2.  Oral contrast material is seen in the colon from recent CT. 3.  The proximal left ureteral stone seen on the CT stone study 03/03/2013 is not visible on today's radiographs, and may be obscured by bowel contrast.  Findings discussed with the patient's nurse, Lattie Haw, 11:30 p.m. 03/05/2013.   Original Report Authenticated By: Curlene Dolphin, M.D.     Medications:     . aspirin EC  162 mg Oral Daily  . atorvastatin  40 mg Oral Daily  . ciprofloxacin  500 mg Oral Q breakfast  . cycloSPORINE  2 drop Both Eyes BID  . docusate sodium  100 mg Oral BID  . enoxaparin (LOVENOX) injection  30 mg Subcutaneous Q24H  . insulin aspart  0-15 Units Subcutaneous TID WC  . insulin aspart  0-5 Units Subcutaneous QHS  . insulin detemir  12 Units Subcutaneous QHS  . isosorbide mononitrate  30 mg Oral Daily  . linagliptin  5 mg Oral Daily  . metroNIDAZOLE  500 mg Oral Q8H  . multivitamin with minerals  2 tablet Oral Daily  . omega-3 acid ethyl esters  1 g Oral Daily  . pantoprazole  40 mg Oral Daily  . polyethylene glycol  17 g Oral Daily  . vitamin B-6  500 mg Oral Daily  . saccharomyces boulardii  250 mg Oral BID  . senna  1 tablet Oral BID  . timolol  1 drop Left Eye  Daily   alum & mag hydroxide-simeth, cloNIDine, HYDROcodone-acetaminophen, morphine injection, ondansetron (ZOFRAN) IV, ondansetron, zolpidem  Assessment/ Plan:   Acute renal Failure. Patients renal function approaching baseline  Chronic renal failure followed by Dr Mercy Moore  HtN controlled  Renal calculi per urology   LOS: 4 Tene Gato W $RemoveBefor'@TODAY'lXvpyjPosLQC$ '@3'$ :32 PM

## 2013-03-07 NOTE — Progress Notes (Signed)
Patient ID: Victor Wallace, male   DOB: Oct 11, 1924, 79 y.o.   MRN: [id number] 1 Day Post-Op  Subjective: He is feeling much better this morning without pain or nausea.   He has had frequency and is using a condom cath but the urine is clear.   His renal function is improving.  ROS: Negative except as above.   he has no fever.   Objective: Vital signs in last 24 hours: Temp:  [97.7 F (36.5 C)-99.1 F (37.3 C)] 98.1 F (36.7 C) (09/06 0609) Pulse Rate:  [67-97] 67 (09/06 0609) Resp:  [11-20] 18 (09/06 0609) BP: (140-186)/(70-89) 159/82 mmHg (09/06 0609) SpO2:  [94 %-100 %] 99 % (09/06 0609) Weight:  [90.719 kg (200 lb)] 90.719 kg (200 lb) (09/06 0609)  Intake/Output from previous day: 09/05 0701 - 09/06 0700 In: 900 [I.V.:900] Out: 1525 [Urine:1525] Intake/Output this shift:    General appearance: alert and no distress GI: soft, non-tender  Lab Results:   Recent Labs  03/06/13 0432 03/07/13 0549  WBC 9.9 7.1  HGB 10.2* 9.8*  HCT 31.1* 30.3*  PLT 127* 128*   BMET  Recent Labs  03/06/13 0432 03/07/13 0549  NA 133* 133*  K 4.2 3.5  CL 105 102  CO2 21 25  GLUCOSE 187* 120*  BUN 40* 32*  CREATININE 2.93* 2.21*  CALCIUM 8.1* 8.5   PT/INR No results found for this basename: LABPROT, INR,  in the last 72 hours ABG No results found for this basename: PHART, PCO2, PO2, HCO3,  in the last 72 hours  Studies/Results: US Renal  03/06/2013   *RADIOLOGY REPORT*  Clinical Data: Follow-up left-sided hydronephrosis.  RENAL/URINARY TRACT ULTRASOUND COMPLETE  Comparison:  CT scan 03/03/2013.  Findings:  Right Kidney:  11.5 cm in length.  Normal renal cortical echogenicity and mild renal cortical thinning.  A mid pole cyst is noted.  No hydronephrosis.  Left Kidney:  15.1 cm in length.  Mild hydronephrosis is present. There is a 6 cm upper pole cyst.  Bladder:  The right ureteral jet is noted.  No left ureteral jet is visualized.  No bladder wall thickening or mass.  Additional  findings:  A right pleural effusion is noted.  Calcified granulomas are noted in the spleen.  IMPRESSION:  1.  Persistent mild left-sided hydronephrosis likely due to the left ureteral calculus seen on the prior CT scan. 2.  Bilateral renal cysts.   Original Report Authenticated By: Marijo Sanes, M.D.   Dg Abd Acute W/chest  03/05/2013   *RADIOLOGY REPORT*  Clinical Data: Worsening abdominal pain  ACUTE ABDOMEN SERIES (ABDOMEN 2 VIEW & CHEST 1 VIEW)  Comparison: CT abdomen pelvis 03/03/2013  Findings: Mild cardiomegaly.  Normal pulmonary vascularity.  Lungs are clear.  There is no contrast material in the colon from the CT examination 2 days ago.  There is mild distention of the colon without evidence of obstruction.  Oral contrast is seen in the rectum.  No dilated loops of small bowel.  No radiopaque urinary tract calculi are seen, but evaluation is markedly limited by colonic contrast.  No free intraperitoneal air is seen.  IMPRESSION:  1.  Nonobstructive bowel gas pattern.  There is mild distention of the colon diffusely suggesting mild colonic ileus. 2.  Oral contrast material is seen in the colon from recent CT. 3.  The proximal left ureteral stone seen on the CT stone study 03/03/2013 is not visible on today's radiographs, and may be obscured by bowel contrast.  Findings discussed with the patient's nurse, [name], 11:30 p.m. 03/05/2013.   Original Report Authenticated By: Britta Mccreedy, M.D.    Anti-infectives: Anti-infectives   Start     Dose/Rate Route Frequency Ordered Stop   03/06/13 0800  ciprofloxacin (CIPRO) tablet 500 mg     500 mg Oral Daily with breakfast 03/05/13 1712     03/05/13 2200  metroNIDAZOLE (FLAGYL) tablet 500 mg     500 mg Oral 3 times per day 03/05/13 1524     03/05/13 1715  ciprofloxacin (CIPRO) IVPB 400 mg  Status:  Discontinued     400 mg 200 mL/hr over 60 Minutes Intravenous Every 24 hours 03/05/13 1712 03/05/13 1713   03/04/13 0200  metroNIDAZOLE (FLAGYL) IVPB 500 mg   Status:  Discontinued     500 mg 100 mL/hr over 60 Minutes Intravenous Every 8 hours 03/04/13 0020 03/05/13 1524   03/04/13 0200  ciprofloxacin (CIPRO) IVPB 400 mg  Status:  Discontinued     400 mg 200 mL/hr over 60 Minutes Intravenous Every 24 hours 03/04/13 0031 03/05/13 1709   03/03/13 2045  cefTRIAXone (ROCEPHIN) 1 g in dextrose 5 % 50 mL IVPB     1 g 100 mL/hr over 30 Minutes Intravenous  Once 03/03/13 2040 03/03/13 2234      Current Facility-Administered Medications  Medication Dose Route Frequency Provider Last Rate Last Dose  . alum & mag hydroxide-simeth (MAALOX/MYLANTA) 200-200-20 MG/5ML suspension 30 mL  30 mL Oral Q6H PRN Alysia Penna, MD   30 mL at 03/05/13 1910  . aspirin EC tablet 162 mg  162 mg Oral Daily Alysia Penna, MD   162 mg at 03/05/13 1123  . atorvastatin (LIPITOR) tablet 40 mg  40 mg Oral Daily Alysia Penna, MD   40 mg at 03/06/13 2140  . ciprofloxacin (CIPRO) tablet 500 mg  500 mg Oral Q breakfast Otho Bellows, RPH   500 mg at 03/07/13 0734  . cloNIDine (CATAPRES) tablet 0.1 mg  0.1 mg Oral Q8H PRN Ezequiel Kayser, MD      . cycloSPORINE (RESTASIS) 0.05 % ophthalmic emulsion 2 drop  2 drop Both Eyes BID Alysia Penna, MD   2 drop at 03/06/13 2141  . docusate sodium (COLACE) capsule 100 mg  100 mg Oral BID Alysia Penna, MD   100 mg at 03/06/13 2140  . enoxaparin (LOVENOX) injection 30 mg  30 mg Subcutaneous Q24H Alysia Penna, MD   30 mg at 03/05/13 1123  . HYDROcodone-acetaminophen (NORCO/VICODIN) 5-325 MG per tablet 1-2 tablet  1-2 tablet Oral Q4H PRN Alysia Penna, MD   2 tablet at 03/05/13 2007  . insulin aspart (novoLOG) injection 0-15 Units  0-15 Units Subcutaneous TID WC Alysia Penna, MD   3 Units at 03/06/13 [phone]  . insulin aspart (novoLOG) injection 0-5 Units  0-5 Units Subcutaneous QHS Alysia Penna, MD      . insulin detemir (LEVEMIR) injection 12 Units  12 Units Subcutaneous QHS Ezequiel Kayser, MD      . isosorbide mononitrate (IMDUR) 24 hr  tablet 30 mg  30 mg Oral Daily Alysia Penna, MD   30 mg at 03/06/13 1053  . linagliptin (TRADJENTA) tablet 5 mg  5 mg Oral Daily Alysia Penna, MD   5 mg at 03/06/13 1052  . metroNIDAZOLE (FLAGYL) tablet 500 mg  500 mg Oral Q8H Alysia Penna, MD   500 mg at 03/07/13 0533  . morphine 2 MG/ML injection 2 mg  2 mg Intravenous Q4H PRN  Velna Hatchet, MD   2 mg at 03/05/13 2210  . multivitamin with minerals tablet 2 tablet  2 tablet Oral Daily Velna Hatchet, MD   2 tablet at 03/05/13 1354  . omega-3 acid ethyl esters (LOVAZA) capsule 1 g  1 g Oral Daily Velna Hatchet, MD   1 g at 03/06/13 1054  . ondansetron (ZOFRAN) tablet 4 mg  4 mg Oral Q6H PRN Velna Hatchet, MD       Or  . ondansetron (ZOFRAN) injection 4 mg  4 mg Intravenous Q6H PRN Velna Hatchet, MD      . pantoprazole (PROTONIX) EC tablet 40 mg  40 mg Oral Daily Velna Hatchet, MD   40 mg at 03/06/13 1053  . polyethylene glycol (MIRALAX / GLYCOLAX) packet 17 g  17 g Oral Daily Velna Hatchet, MD   17 g at 03/05/13 1122  . pyridOXINE (VITAMIN B-6) tablet 500 mg  500 mg Oral Daily Velna Hatchet, MD   500 mg at 03/06/13 1050  . saccharomyces boulardii (FLORASTOR) capsule 250 mg  250 mg Oral BID Jerlyn Ly, MD      . senna (SENOKOT) tablet 8.6 mg  1 tablet Oral BID Velna Hatchet, MD   8.6 mg at 03/06/13 2140  . timolol (TIMOPTIC) 0.5 % ophthalmic solution 1 drop  1 drop Left Eye Daily Velna Hatchet, MD   1 drop at 03/06/13 1055  . zolpidem (AMBIEN) tablet 5 mg  5 mg Oral QHS PRN Velna Hatchet, MD        Assessment: s/p Procedure(s): CYSTOSCOPY WITH RETROGRADE PYELOGRAM/URETERAL STENT PLACEMENT  He is improved post stenting.  Urine culture was negative  Plan: KUB today to assess current stone location. He will need either ESWL or ureteroscopy for the stone in a week or two after he has more fully recovered from the acute event.  D/C home per [facility] service.       LOS: 4 days    Thurl Boen J 03/07/2013

## 2013-03-07 NOTE — Progress Notes (Signed)
Subjective: Feeling some better. Denies pain.   He got  Up bedside commode with assist.  Objective: Vital signs in last 24 hours: Temp:  [97.7 F (36.5 C)-99.1 F (37.3 C)] 98.1 F (36.7 C) (09/06 0609) Pulse Rate:  [67-97] 67 (09/06 0609) Resp:  [11-20] 18 (09/06 0609) BP: (140-186)/(70-89) 159/82 mmHg (09/06 0609) SpO2:  [94 %-100 %] 99 % (09/06 0609) Weight:  [90.719 kg (200 lb)] 90.719 kg (200 lb) (09/06 0609) Weight change: 1.619 kg (3 lb 9.1 oz) Last BM Date: 03/03/13  Intake/Output from previous day: 09/05 0701 - 09/06 0700 In: 900 [I.V.:900] Out: 1525 [Urine:1525] Intake/Output this shift:    General appearance: alert and cooperative Resp: clear to auscultation bilaterally Cardio: regular rate and rhythm, S1, S2 normal, no murmur, click, rub or gallop GI: soft, non-tender; bowel sounds normal; no masses,  no organomegaly Extremities: extremities normal, atraumatic, no cyanosis or edema Neurologic: Grossly normal   Lab Results:  Recent Labs  03/06/13 0432 03/07/13 0549  WBC 9.9 7.1  HGB 10.2* 9.8*  HCT 31.1* 30.3*  PLT 127* 128*   BMET  Recent Labs  03/06/13 0432 03/07/13 0549  NA 133* 133*  K 4.2 3.5  CL 105 102  CO2 21 25  GLUCOSE 187* 120*  BUN 40* 32*  CREATININE 2.93* 2.21*  CALCIUM 8.1* 8.5   CMET CMP     Component Value Date/Time   NA 133* 03/07/2013 0549   K 3.5 03/07/2013 0549   CL 102 03/07/2013 0549   CO2 25 03/07/2013 0549   GLUCOSE 120* 03/07/2013 0549   BUN 32* 03/07/2013 0549   CREATININE 2.21* 03/07/2013 0549   CALCIUM 8.5 03/07/2013 0549   PROT 6.6 03/03/2013 1814   ALBUMIN 3.3* 03/03/2013 1814   AST 29 03/03/2013 1814   ALT 19 03/03/2013 1814   ALKPHOS 98 03/03/2013 1814   BILITOT 0.8 03/03/2013 1814   GFRNONAA 25* 03/07/2013 0549   GFRAA 29* 03/07/2013 0549     Studies/Results: US Renal  03/06/2013   *RADIOLOGY REPORT*  Clinical Data: Follow-up left-sided hydronephrosis.  RENAL/URINARY TRACT ULTRASOUND COMPLETE  Comparison:  CT scan  03/03/2013.  Findings:  Right Kidney:  11.5 cm in length.  Normal renal cortical echogenicity and mild renal cortical thinning.  A mid pole cyst is noted.  No hydronephrosis.  Left Kidney:  15.1 cm in length.  Mild hydronephrosis is present. There is a 6 cm upper pole cyst.  Bladder:  The right ureteral jet is noted.  No left ureteral jet is visualized.  No bladder wall thickening or mass.  Additional findings:  A right pleural effusion is noted.  Calcified granulomas are noted in the spleen.  IMPRESSION:  1.  Persistent mild left-sided hydronephrosis likely due to the left ureteral calculus seen on the prior CT scan. 2.  Bilateral renal cysts.   Original Report Authenticated By: Marijo Sanes, M.D.   Dg Abd Acute W/chest  03/05/2013   *RADIOLOGY REPORT*  Clinical Data: Worsening abdominal pain  ACUTE ABDOMEN SERIES (ABDOMEN 2 VIEW & CHEST 1 VIEW)  Comparison: CT abdomen pelvis 03/03/2013  Findings: Mild cardiomegaly.  Normal pulmonary vascularity.  Lungs are clear.  There is no contrast material in the colon from the CT examination 2 days ago.  There is mild distention of the colon without evidence of obstruction.  Oral contrast is seen in the rectum.  No dilated loops of small bowel.  No radiopaque urinary tract calculi are seen, but evaluation is markedly limited by  colonic contrast.  No free intraperitoneal air is seen.  IMPRESSION:  1.  Nonobstructive bowel gas pattern.  There is mild distention of the colon diffusely suggesting mild colonic ileus. 2.  Oral contrast material is seen in the colon from recent CT. 3.  The proximal left ureteral stone seen on the CT stone study 03/03/2013 is not visible on today's radiographs, and may be obscured by bowel contrast.  Findings discussed with the patient's nurse, Lattie Haw, 11:30 p.m. 03/05/2013.   Original Report Authenticated By: Curlene Dolphin, M.D.    Medications: I have reviewed the patient's current medications.  Marland Kitchen aspirin EC  162 mg Oral Daily  . atorvastatin   40 mg Oral Daily  . ciprofloxacin  500 mg Oral Q breakfast  . cycloSPORINE  2 drop Both Eyes BID  . docusate sodium  100 mg Oral BID  . enoxaparin (LOVENOX) injection  30 mg Subcutaneous Q24H  . insulin aspart  0-15 Units Subcutaneous TID WC  . insulin aspart  0-5 Units Subcutaneous QHS  . insulin detemir  18 Units Subcutaneous QHS  . isosorbide mononitrate  30 mg Oral Daily  . linagliptin  5 mg Oral Daily  . metroNIDAZOLE  500 mg Oral Q8H  . multivitamin with minerals  2 tablet Oral Daily  . omega-3 acid ethyl esters  1 g Oral Daily  . pantoprazole  40 mg Oral Daily  . polyethylene glycol  17 g Oral Daily  . vitamin B-6  500 mg Oral Daily  . senna  1 tablet Oral BID  . timolol  1 drop Left Eye Daily    CBG (last 3)   Recent Labs  03/06/13 1209 03/06/13 1620 03/06/13 1701  GLUCAP 86 92 101*     Assessment/Plan: Patient Active Problem List   Diagnosis Date Noted  . Enteritis 03/03/2013  . Acute renal failure 01/31/2012  . Altered mental status 01/31/2012  . Diabetes mellitus 01/31/2012  . GERD (gastroesophageal reflux disease) 01/31/2012  . UTI (lower urinary tract infection) 01/31/2012  . HYPERLIPIDEMIA 12/02/2008  . CAD, NATIVE VESSEL 12/02/2008   We will continue the cipro and flagyl for now.  I will make sure P.T. Is consulted or re-consulted.  Wife is at the bedside.  I will reduce diabetes regimen slightly. Follow BP as he was a bit hypertensive last night. Bun/cr are improving. Follow. Defer f/up plan to urology for the kidney stone.   LOS: 4 days   Jerlyn Ly, MD 03/07/2013, 9:03 AM

## 2013-03-07 NOTE — Progress Notes (Signed)
Pharmacy: Brief Abx Note   79 yo M on D#4 Ciprofloxacin 500 mg PO Q24h/Flagyl 500 mg PO TID for Enteritis and suspected UTI.    Patient is AF, WBC improved to WNL   ARF - Scr improved to 2.21 with estimated CrCl 28 ml/min  9/3 Urine culture NG Final (Patient did receive a dose of abx prior to culture obtained)  Plan 1.) Continue Cipro 500 mg po Daily - adjusted for CrCl < 30 ml/min  2.) Continue Flagyl 500 mg po TID.  No renal adjustment needed 3.) Will f/u course of therapy   Winfrey Chillemi, Gaye Alken PharmD Pager #: [phone] 11:47 AM 03/07/2013

## 2013-03-07 NOTE — Progress Notes (Signed)
Physical Therapy Treatment Patient Details Name: Victor Wallace MRN: [id number] DOB: 17-May-1925 Today's Date: 03/07/2013 Time: 1660-6301 PT Time Calculation (min): 25 min  PT Assessment / Plan / Recommendation  History of Present Illness Victor Wallace is an 79 y.o. male.  Pt presents w/ several days of worsening constipation that has progressed into abdominal pain, chills, weakness and poor po intake for the past several days.    PT Comments   Pt tolerated well. Discussed with pt and wife how to progress pt with ambulation at home and if they felt not progressing and/or would like to refine balance issues, recommend to f/u with MD about OPPT. They agree.   Follow Up Recommendations  No PT follow up (discussed with wife and pt)     Does the patient have the potential to tolerate intense rehabilitation     Barriers to Discharge        Equipment Recommendations  None recommended by PT (has equipment)    Recommendations for Other Services    Frequency Min 3X/week   Progress towards PT Goals Progress towards PT goals: Progressing toward goals  Plan Current plan remains appropriate    Precautions / Restrictions     Pertinent Vitals/Pain No pain    Mobility  Bed Mobility Bed Mobility: Not assessed (pt up in chair) Transfers Transfers: Sit to Stand;Stand to Sit Sit to Stand: 4: Min assist;With upper extremity assist Stand to Sit: 4: Min assist;With upper extremity assist Details for Transfer Assistance: cues or safety with hand placement and cues to contol descent of stand to sit.  Ambulation/Gait Ambulation/Gait Assistance: 4: Min guard Ambulation Distance (Feet): 80 Feet (two times) Assistive device: Rolling walker Gait Pattern: Step-through pattern Gait velocity: slow but at baseline    Exercises     PT Diagnosis:    PT Problem List:   PT Treatment Interventions:     PT Goals (current goals can now be found in the care plan section) Acute Rehab PT Goals PT  Goal Formulation: With patient Time For Goal Achievement: 03/11/13 Potential to Achieve Goals: Good  Visit Information  Last PT Received On: 03/07/13 Assistance Needed: +1 History of Present Illness: Victor Wallace is an 79 y.o. male.  Pt presents w/ several days of worsening constipation that has progressed into abdominal pain, chills, weakness and poor po intake for the past several days.     Subjective Data  Subjective: I am wanting to get stronger to go home.    Cognition  Cognition Arousal/Alertness: Awake/alert Behavior During Therapy: WFL for tasks assessed/performed Overall Cognitive Status: Within Functional Limits for tasks assessed    Balance     End of Session PT - End of Session Equipment Utilized During Treatment: Gait belt Activity Tolerance: Patient tolerated treatment well Patient left: in chair;with call bell/phone within reach;with family/visitor present Nurse Communication: Mobility status   GP     Clide Dales 03/07/2013, 6:05 PM .Clide Dales, PT Pager: [phone] 03/07/2013

## 2013-03-08 DIAGNOSIS — N201 Calculus of ureter: Secondary | ICD-10-CM | POA: Diagnosis not present

## 2013-03-08 DIAGNOSIS — R109 Unspecified abdominal pain: Secondary | ICD-10-CM | POA: Diagnosis not present

## 2013-03-08 DIAGNOSIS — R509 Fever, unspecified: Secondary | ICD-10-CM | POA: Diagnosis not present

## 2013-03-08 LAB — BASIC METABOLIC PANEL
CO2: 25 mEq/L (ref 19–32)
Chloride: 108 mEq/L (ref 96–112)
Creatinine, Ser: 2.06 mg/dL — ABNORMAL HIGH (ref 0.50–1.35)
GFR calc Af Amer: 32 mL/min — ABNORMAL LOW (ref >90)
Potassium: 3.4 mEq/L — ABNORMAL LOW (ref 3.5–5.1)
Sodium: 139 mEq/L (ref 135–145)

## 2013-03-08 LAB — CBC
HCT: 31.6 % — ABNORMAL LOW (ref 39.0–52.0)
Hemoglobin: 10.4 g/dL — ABNORMAL LOW (ref 13.0–17.0)
MCV: 88.5 fL (ref 78.0–100.0)
RBC: 3.57 MIL/uL — ABNORMAL LOW (ref 4.22–5.81)
RDW: 13.8 % (ref 11.5–15.5)
WBC: 7.7 10*3/uL (ref 4.0–10.5)

## 2013-03-08 LAB — GLUCOSE, CAPILLARY
Glucose-Capillary: 192 mg/dL — ABNORMAL HIGH (ref 70–99)
Glucose-Capillary: 87 mg/dL (ref 70–99)
Glucose-Capillary: 86 mg/dL (ref 70–99)

## 2013-03-08 LAB — CLOSTRIDIUM DIFFICILE BY PCR: Toxigenic C. Difficile by PCR: NEGATIVE

## 2013-03-08 MED ORDER — BISMUTH SUBSALICYLATE 262 MG/15ML PO SUSP
30.0000 mL | Freq: Four times a day (QID) | ORAL | Status: DC | PRN
  Administered 2013-03-08: 30 mL via ORAL
  Filled 2013-03-08: qty 236

## 2013-03-08 MED ORDER — VANCOMYCIN 50 MG/ML ORAL SOLUTION
250.0000 mg | Freq: Four times a day (QID) | ORAL | Status: DC
  Administered 2013-03-08 – 2013-03-09 (×3): 250 mg via ORAL
  Filled 2013-03-08 (×7): qty 5

## 2013-03-08 MED ORDER — ACETAMINOPHEN 325 MG PO TABS
650.0000 mg | ORAL_TABLET | Freq: Four times a day (QID) | ORAL | Status: DC | PRN
  Administered 2013-03-09: 650 mg via ORAL
  Filled 2013-03-08: qty 2

## 2013-03-08 NOTE — Progress Notes (Signed)
Subjective: 3 large loose watery stools yesterday.  He is not in pain and overall feeling better. Not eating much      Objective: Vital signs in last 24 hours: Temp:  [98.1 F (36.7 C)-98.6 F (37 C)] 98.6 F (37 C) (09/07 0539) Pulse Rate:  [64-72] 72 (09/07 0637) Resp:  [18] 18 (09/07 0637) BP: (124-159)/(67-81) 159/76 mmHg (09/07 0637) SpO2:  [95 %-97 %] 97 % (09/07 7673) Weight change:  Last BM Date: 03/07/13  Intake/Output from previous day: 09/06 0701 - 09/07 0700 In: -  Out: 902 [Urine:900; Stool:2] Intake/Output this shift: Total I/O In: -  Out: 600 [Urine:600]  General appearance: alert, cooperative and appears stated age Resp: clear to auscultation bilaterally Cardio: regular rate and rhythm, S1, S2 normal, no murmur, click, rub or gallop GI: soft, non-tender; bowel sounds normal; no masses,  no organomegaly and hyperactive bowel sounds but not tender, no mass or hsm Extremities: extremities normal, atraumatic, no cyanosis or edema Neurologic: Grossly normal   Lab Results:  Recent Labs  03/07/13 0549 03/08/13 0550  WBC 7.1 7.7  HGB 9.8* 10.4*  HCT 30.3* 31.6*  PLT 128* 148*   BMET  Recent Labs  03/07/13 0549 03/08/13 0550  NA 133* 139  K 3.5 3.4*  CL 102 108  CO2 25 25  GLUCOSE 120* 115*  BUN 32* 27*  CREATININE 2.21* 2.06*  CALCIUM 8.5 8.6   CMET CMP     Component Value Date/Time   NA 139 03/08/2013 0550   K 3.4* 03/08/2013 0550   CL 108 03/08/2013 0550   CO2 25 03/08/2013 0550   GLUCOSE 115* 03/08/2013 0550   BUN 27* 03/08/2013 0550   CREATININE 2.06* 03/08/2013 0550   CALCIUM 8.6 03/08/2013 0550   PROT 6.6 03/03/2013 1814   ALBUMIN 3.3* 03/03/2013 1814   AST 29 03/03/2013 1814   ALT 19 03/03/2013 1814   ALKPHOS 98 03/03/2013 1814   BILITOT 0.8 03/03/2013 1814   GFRNONAA 28* 03/08/2013 0550   GFRAA 32* 03/08/2013 0550     Studies/Results: Dg Abd 1 View  03/07/2013   *RADIOLOGY REPORT*  Clinical Data: Left-sided ureteral stone  ABDOMEN - 1 VIEW   Comparison: 03/05/2013; CT abdomen pelvis - 03/03/2013  Findings:  The patient has undergone placement of a left-sided double J ureteral stent with superior coil overlying dissected location of the left renal pelvis and inferior coil overlying expected location of the urinary bladder.  No definite opacities are seen along the course of the left-sided double J ureteral stent.  There is a punctate (approximately 5 mm) opacity overlying the expected location of the inferior pole of the left kidney.  Several phleboliths again overlie the lower pelvis.  Prostatic calcifications.  Residual contrast is seen within the colon from prior abdominal CT. No evidence of enteric obstruction.  Post right total hip replacement, incompletely imaged.  Lumbar spine degenerative change.  IMPRESSION: 1. Post left-sided double J ureteral stent placement.  No definite opacities overlying the course of the left-sided double J ureteral stent.  2.  Punctate opacity overlying the expected location of the inferior pole of the left kidney may represent the previously identified left sided ureteral stone, now within the inferior aspect of the left renal collecting system, versus radiopaque debris within the overlying colon.   Original Report Authenticated By: Jake Seats, MD   US Renal  03/06/2013   *RADIOLOGY REPORT*  Clinical Data: Follow-up left-sided hydronephrosis.  RENAL/URINARY TRACT ULTRASOUND COMPLETE  Comparison:  CT scan 03/03/2013.  Findings:  Right Kidney:  11.5 cm in length.  Normal renal cortical echogenicity and mild renal cortical thinning.  A mid pole cyst is noted.  No hydronephrosis.  Left Kidney:  15.1 cm in length.  Mild hydronephrosis is present. There is a 6 cm upper pole cyst.  Bladder:  The right ureteral jet is noted.  No left ureteral jet is visualized.  No bladder wall thickening or mass.  Additional findings:  A right pleural effusion is noted.  Calcified granulomas are noted in the spleen.  IMPRESSION:  1.   Persistent mild left-sided hydronephrosis likely due to the left ureteral calculus seen on the prior CT scan. 2.  Bilateral renal cysts.   Original Report Authenticated By: Marijo Sanes, M.D.    Medications: I have reviewed the patient's current medications. Marland Kitchen aspirin EC  162 mg Oral Daily  . atorvastatin  40 mg Oral Daily  . ciprofloxacin  500 mg Oral Q breakfast  . cycloSPORINE  2 drop Both Eyes BID  . docusate sodium  100 mg Oral BID  . enoxaparin (LOVENOX) injection  30 mg Subcutaneous Q24H  . insulin aspart  0-15 Units Subcutaneous TID WC  . insulin aspart  0-5 Units Subcutaneous QHS  . insulin detemir  12 Units Subcutaneous QHS  . isosorbide mononitrate  30 mg Oral Daily  . linagliptin  5 mg Oral Daily  . metroNIDAZOLE  500 mg Oral Q8H  . multivitamin with minerals  2 tablet Oral Daily  . omega-3 acid ethyl esters  1 g Oral Daily  . pantoprazole  40 mg Oral Daily  . polyethylene glycol  17 g Oral Daily  . vitamin B-6  500 mg Oral Daily  . saccharomyces boulardii  250 mg Oral BID  . senna  1 tablet Oral BID  . timolol  1 drop Left Eye Daily     Assessment/Plan: 79 yo male with ARF, obstructing kidney stone, treated with stent (for lithotripsy at a later time).  He is feeling better but has had the onset of three large loose watery stools. Arguing against c. Diff is that he has no fever or inc. Wbc and he has been on flagyl along with the cipro. However, c. Diff is possible.  We will d/c any laxatives he is on (hopefully this along will fix this problem).  We will send stool for c.diff and we will cont. The flagyl for now.  Hopefully home tomorrow if diarrhea better and labs stable.       LOS: 5 days   Jerlyn Ly, MD 03/08/2013, 8:49 AM

## 2013-03-08 NOTE — Progress Notes (Signed)
Patient ID: Victor Wallace, male   DOB: [birth year], 79 y.o.   MRN: [id number] 2 Days Post-Op  Subjective: Mr. Lafitte continues to improve with a further decline in his Cr.   He reports no pain and is voiding better.   A KUB shows the stone is in the kidney inferior to the stent coil.   His urine culture was negative on admission.  ROS: Negative except as above.   He has no nausea or fever.   Objective: Vital signs in last 24 hours: Temp:  [98.1 F (36.7 C)-98.6 F (37 C)] 98.6 F (37 C) (09/07 3744) Pulse Rate:  [64-72] 72 (09/07 0637) Resp:  [18] 18 (09/07 0637) BP: (124-159)/(67-81) 159/76 mmHg (09/07 0637) SpO2:  [95 %-97 %] 97 % (09/07 0637)  Intake/Output from previous day: 09/06 0701 - 09/07 0700 In: -  Out: 902 [Urine:900; Stool:2] Intake/Output this shift: Total I/O In: -  Out: 600 [Urine:600]  General appearance: alert and no distress  Lab Results:   Recent Labs  03/07/13 0549 03/08/13 0550  WBC 7.1 7.7  HGB 9.8* 10.4*  HCT 30.3* 31.6*  PLT 128* 148*   BMET  Recent Labs  03/07/13 0549 03/08/13 0550  NA 133* 139  K 3.5 3.4*  CL 102 108  CO2 25 25  GLUCOSE 120* 115*  BUN 32* 27*  CREATININE 2.21* 2.06*  CALCIUM 8.5 8.6   PT/INR No results found for this basename: LABPROT, INR,  in the last 72 hours ABG No results found for this basename: PHART, PCO2, PO2, HCO3,  in the last 72 hours  Studies/Results: Dg Abd 1 View  03/07/2013   *RADIOLOGY REPORT*  Clinical Data: Left-sided ureteral stone  ABDOMEN - 1 VIEW  Comparison: 03/05/2013; CT abdomen pelvis - 03/03/2013  Findings:  The patient has undergone placement of a left-sided double J ureteral stent with superior coil overlying dissected location of the left renal pelvis and inferior coil overlying expected location of the urinary bladder.  No definite opacities are seen along the course of the left-sided double J ureteral stent.  There is a punctate (approximately 5 mm) opacity overlying the  expected location of the inferior pole of the left kidney.  Several phleboliths again overlie the lower pelvis.  Prostatic calcifications.  Residual contrast is seen within the colon from prior abdominal CT. No evidence of enteric obstruction.  Post right total hip replacement, incompletely imaged.  Lumbar spine degenerative change.  IMPRESSION: 1. Post left-sided double J ureteral stent placement.  No definite opacities overlying the course of the left-sided double J ureteral stent.  2.  Punctate opacity overlying the expected location of the inferior pole of the left kidney may represent the previously identified left sided ureteral stone, now within the inferior aspect of the left renal collecting system, versus radiopaque debris within the overlying colon.   Original Report Authenticated By: Tacey Ruiz, MD    Anti-infectives: Anti-infectives   Start     Dose/Rate Route Frequency Ordered Stop   03/06/13 0800  ciprofloxacin (CIPRO) tablet 500 mg  Status:  Discontinued     500 mg Oral Daily with breakfast 03/05/13 1712 03/08/13 0902   03/05/13 2200  metroNIDAZOLE (FLAGYL) tablet 500 mg     500 mg Oral 3 times per day 03/05/13 1524     03/05/13 1715  ciprofloxacin (CIPRO) IVPB 400 mg  Status:  Discontinued     400 mg 200 mL/hr over 60 Minutes Intravenous Every 24 hours 03/05/13 1712 03/05/13  1713   03/04/13 0200  metroNIDAZOLE (FLAGYL) IVPB 500 mg  Status:  Discontinued     500 mg 100 mL/hr over 60 Minutes Intravenous Every 8 hours 03/04/13 0020 03/05/13 1524   03/04/13 0200  ciprofloxacin (CIPRO) IVPB 400 mg  Status:  Discontinued     400 mg 200 mL/hr over 60 Minutes Intravenous Every 24 hours 03/04/13 0031 03/05/13 1709   03/03/13 2045  cefTRIAXone (ROCEPHIN) 1 g in dextrose 5 % 50 mL IVPB     1 g 100 mL/hr over 30 Minutes Intravenous  Once 03/03/13 2040 03/03/13 2234      Current Facility-Administered Medications  Medication Dose Route Frequency Provider Last Rate Last Dose  . alum &  mag hydroxide-simeth (MAALOX/MYLANTA) 200-200-20 MG/5ML suspension 30 mL  30 mL Oral Q6H PRN Velna Hatchet, MD   30 mL at 03/05/13 1910  . aspirin EC tablet 162 mg  162 mg Oral Daily Velna Hatchet, MD   162 mg at 03/08/13 1054  . atorvastatin (LIPITOR) tablet 40 mg  40 mg Oral Daily Velna Hatchet, MD   40 mg at 03/07/13 1830  . cloNIDine (CATAPRES) tablet 0.1 mg  0.1 mg Oral Q8H PRN Jerlyn Ly, MD      . cycloSPORINE (RESTASIS) 0.05 % ophthalmic emulsion 2 drop  2 drop Both Eyes BID Velna Hatchet, MD   2 drop at 03/08/13 1053  . enoxaparin (LOVENOX) injection 30 mg  30 mg Subcutaneous Q24H Velna Hatchet, MD   30 mg at 03/08/13 1054  . HYDROcodone-acetaminophen (NORCO/VICODIN) 5-325 MG per tablet 1-2 tablet  1-2 tablet Oral Q4H PRN Velna Hatchet, MD   2 tablet at 03/05/13 2007  . insulin aspart (novoLOG) injection 0-15 Units  0-15 Units Subcutaneous TID WC Velna Hatchet, MD   3 Units at 03/07/13 1730  . insulin aspart (novoLOG) injection 0-5 Units  0-5 Units Subcutaneous QHS Velna Hatchet, MD      . insulin detemir (LEVEMIR) injection 12 Units  12 Units Subcutaneous QHS Jerlyn Ly, MD   12 Units at 03/07/13 2310  . isosorbide mononitrate (IMDUR) 24 hr tablet 30 mg  30 mg Oral Daily Velna Hatchet, MD   30 mg at 03/08/13 1054  . linagliptin (TRADJENTA) tablet 5 mg  5 mg Oral Daily Velna Hatchet, MD   5 mg at 03/08/13 1054  . metroNIDAZOLE (FLAGYL) tablet 500 mg  500 mg Oral Q8H Velna Hatchet, MD   500 mg at 03/08/13 0524  . morphine 2 MG/ML injection 2 mg  2 mg Intravenous Q4H PRN Velna Hatchet, MD   2 mg at 03/05/13 2210  . multivitamin with minerals tablet 2 tablet  2 tablet Oral Daily Velna Hatchet, MD   2 tablet at 03/07/13 1102  . omega-3 acid ethyl esters (LOVAZA) capsule 1 g  1 g Oral Daily Velna Hatchet, MD   1 g at 03/08/13 1053  . ondansetron (ZOFRAN) tablet 4 mg  4 mg Oral Q6H PRN Velna Hatchet, MD       Or  . ondansetron (ZOFRAN) injection 4 mg  4 mg Intravenous Q6H  PRN Velna Hatchet, MD      . pantoprazole (PROTONIX) EC tablet 40 mg  40 mg Oral Daily Velna Hatchet, MD   40 mg at 03/08/13 1054  . pyridOXINE (VITAMIN B-6) tablet 500 mg  500 mg Oral Daily Velna Hatchet, MD   500 mg at 03/08/13 1053  . saccharomyces boulardii (FLORASTOR) capsule 250 mg  250 mg Oral BID Elta Guadeloupe  A Perini, MD   250 mg at 03/08/13 1053  . timolol (TIMOPTIC) 0.5 % ophthalmic solution 1 drop  1 drop Left Eye Daily Velna Hatchet, MD   1 drop at 03/08/13 1053  . zolpidem (AMBIEN) tablet 5 mg  5 mg Oral QHS PRN Velna Hatchet, MD       I have reviewed his labs and his KUB film and report.   Assessment: s/p Procedure(s): CYSTOSCOPY WITH RETROGRADE PYELOGRAM/URETERAL STENT PLACEMENT  He continues to improve and has no pain complaints. The KUB shows that the stone is in the kidney, possibly in the lower pole.  Plan: His options are ureteroscopy and ESWL for stone removal.   With the stone in the kidney, I am going to set him up for ESWL and reviewed the risks of bleeding, infection, injury to the kidney or adjacent organs, failure of fragmentation with need for secondary procedures, thrombotic events and anesthetic risks.   He will need to be off of ASA, NSAID's or other anticoagulants and antiplatelet agents prior to the procedure.  He could probably be transitioned to oral antibiotics, possibly cipro, if that is ok with renal.     LOS: 5 days    Malka So 03/08/2013

## 2013-03-09 ENCOUNTER — Encounter (HOSPITAL_COMMUNITY): Payer: Self-pay | Admitting: Urology

## 2013-03-09 DIAGNOSIS — M25559 Pain in unspecified hip: Secondary | ICD-10-CM | POA: Diagnosis not present

## 2013-03-09 DIAGNOSIS — I10 Essential (primary) hypertension: Secondary | ICD-10-CM | POA: Diagnosis not present

## 2013-03-09 DIAGNOSIS — K5900 Constipation, unspecified: Secondary | ICD-10-CM | POA: Diagnosis not present

## 2013-03-09 DIAGNOSIS — N289 Disorder of kidney and ureter, unspecified: Secondary | ICD-10-CM | POA: Diagnosis not present

## 2013-03-09 DIAGNOSIS — N201 Calculus of ureter: Secondary | ICD-10-CM | POA: Diagnosis present

## 2013-03-09 LAB — CBC
MCH: 29.1 pg (ref 26.0–34.0)
MCHC: 32.9 g/dL (ref 30.0–36.0)
MCV: 88.5 fL (ref 78.0–100.0)
Platelets: 151 10*3/uL (ref 150–400)
RBC: 3.81 MIL/uL — ABNORMAL LOW (ref 4.22–5.81)

## 2013-03-09 LAB — BASIC METABOLIC PANEL
BUN: 23 mg/dL (ref 6–23)
CO2: 25 mEq/L (ref 19–32)
Calcium: 8.5 mg/dL (ref 8.4–10.5)
GFR calc non Af Amer: 31 mL/min — ABNORMAL LOW (ref >90)
Glucose, Bld: 137 mg/dL — ABNORMAL HIGH (ref 70–99)
Sodium: 137 mEq/L (ref 135–145)

## 2013-03-09 LAB — GLUCOSE, CAPILLARY: Glucose-Capillary: 180 mg/dL — ABNORMAL HIGH (ref 70–99)

## 2013-03-09 NOTE — Discharge Summary (Signed)
DISCHARGE SUMMARY  Victor Wallace  MR#: [id number]  DOB:22-Apr-1925  Date of Admission: 03/03/2013 Date of Discharge: 03/09/2013  Attending Physician:Suzannah Bettes W  Patient's BBJ:[id number] W, MD  Consults:Treatment Team:  Anner Crete, MD Garnetta Buddy, MD  Discharge Diagnoses: Principal Problem:   Ureteral stone Active Problems:   Enteritis   Acute renal insufficiency Constipation Hip pain/OA DM2 HTN GERD CKD stage 3  Discharge Medications:   Medication List         aspirin EC 81 MG tablet  Take 162 mg by mouth daily.     atorvastatin 40 MG tablet  Commonly known as:  LIPITOR  Take 40 mg by mouth daily.     cycloSPORINE 0.05 % ophthalmic emulsion  Commonly known as:  RESTASIS  Place 2 drops into both eyes 2 (two) times daily.     fish oil-omega-3 fatty acids 1000 MG capsule  Take 1 capsule (1 g total) by mouth daily. Stop taking if having diarrhea     insulin aspart 100 UNIT/ML injection  Commonly known as:  novoLOG  Inject 0-15 Units into the skin 3 (three) times daily with meals.     insulin detemir 100 UNIT/ML injection  Commonly known as:  LEVEMIR  Inject 14 Units into the skin at bedtime.     isosorbide mononitrate 30 MG 24 hr tablet  Commonly known as:  IMDUR  Take 30 mg by mouth daily.     linagliptin 5 MG Tabs tablet  Commonly known as:  TRADJENTA  Take 1 tablet (5 mg total) by mouth daily.     multivitamin with minerals tablet  Take 2 tablets by mouth daily.     PRESERVISION AREDS 2 Caps  Take 1 capsule by mouth 2 (two) times daily.     omeprazole 20 MG capsule  Commonly known as:  PRILOSEC  Take 20 mg by mouth every other day.     polyethylene glycol packet  Commonly known as:  MIRALAX / GLYCOLAX  Take 17 g by mouth 2 (two) times daily as needed (for constipation).     timolol 0.5 % ophthalmic solution  Commonly known as:  BETIMOL  Place 1 drop into the left eye daily.     vitamin B-6 500 MG tablet  Take 500 mg by  mouth daily.        Hospital Procedures: Ct Abdomen Pelvis Wo Contrast  03/05/2013   **ADDENDUM** CREATED: 03/05/2013 23:33:12  CT abdomen pelvis performed 03/03/2013 was reviewed by myself today while interpreting an acute abdominal series performed 03/05/2013. It is noted that a 5 mm proximal left ureteral stone seen on this CT results in proximal mild left hydroureteronephrosis. This finding was called to the patient's nurse, [name], at 11:30 p.m. 03/03/2013.  She will relay this information to the physician taking care of the patient.  **END ADDENDUM** SIGNED BY: Britta Mccreedy, M.D.  03/03/2013   *RADIOLOGY REPORT*  Clinical Data: Vomiting.  Abdominal pain.  Constipation.  CT ABDOMEN AND PELVIS WITHOUT CONTRAST  Technique:  Multidetector CT imaging of the abdomen and pelvis was performed following the standard protocol without intravenous contrast.  Comparison: No priors.  Findings:  Lung Bases: Scarring or subsegmental atelectasis in the right lower lobe posteriorly.  Calcifications of the aortic valve.  Abdomen/Pelvis:  Multiple calcifications within the liver and spleen, compatible with calcified granulomas.  Subcentimeter low attenuation lesion in segment 4A of the liver is incompletely characterized on today's noncontrast CT examination.  The unenhanced appearance of the  gallbladder and bilateral adrenal glands is unremarkable.  In the head of the pancreas there is a poorly defined 2.4 x 1.6 cm low attenuation lesion which is incompletely characterized.  2.1 x 2.8 cm low attenuation lesion in the anterior aspect of the spleen is also incompletely characterized.  A 3.4 cm intermediate attenuation (22 HU) lesion extending exophytically off the medial aspect of the interpolar region of the right kidney, with additional subcentimeter low attenuation lesion immediately above that, both incompletely characterized.  Large exophytic rim calcified 6.6 x 4.6 cm low attenuation lesion extending off the interpolar  region of the left kidney posteriorly.  Extensive atherosclerosis of the abdominal and pelvic vasculature, without evidence of frank aneurysm.  No significant volume of ascites.  No pneumoperitoneum.  No pathologic distension of small bowel.  No definite pathologic lymphadenopathy identified within the abdomen or pelvis on today's noncontrast CT examination. Normal appendix.  The urinary bladder is unremarkable in appearance.  There is some thickening of some small bowel loops, most pronounced in the proximal jejunum, which could suggest inflammation.  Musculoskeletal: Old healed fractures of the inferior pubic rami bilaterally.  Status post right total hip arthroplasty. There are no aggressive appearing lytic or blastic lesions noted in the visualized portions of the skeleton.  IMPRESSION: 1.  Mild thickening of the proximal small bowel in the region of the proximal jejunum.  This is nonspecific, but could suggest infection or inflammation (i.e., enteritis).  Clinical correlation is recommended. 2.  2.4 x 1.6 cm low attenuation lesion in the head of the pancreas, low attenuation splenic lesion, and numerous bilateral renal lesions (two of which appeared complex) are all incompletely characterized on today's noncontrast CT examination. Although these may simply all represent benign lesions such as cysts, further evaluation of the abdomen with non emergent MRI with and without IV gadolinium is recommended to fully characterize these lesions, as any one of these could potentially be neoplastic. 3.  Extensive atherosclerosis. 4.  Sequelae of old granulomatous disease with numerous calcified granulomas in the liver and spleen. 5.  Compression fracture at L1 with approximately 20% loss of anterior vertebral body height which appears old. 6.  Additional incidental findings, as above.   Original Report Authenticated By: Vinnie Langton, M.D.   Dg Abd 1 View  03/07/2013   *RADIOLOGY REPORT*  Clinical Data: Left-sided  ureteral stone  ABDOMEN - 1 VIEW  Comparison: 03/05/2013; CT abdomen pelvis - 03/03/2013  Findings:  The patient has undergone placement of a left-sided double J ureteral stent with superior coil overlying dissected location of the left renal pelvis and inferior coil overlying expected location of the urinary bladder.  No definite opacities are seen along the course of the left-sided double J ureteral stent.  There is a punctate (approximately 5 mm) opacity overlying the expected location of the inferior pole of the left kidney.  Several phleboliths again overlie the lower pelvis.  Prostatic calcifications.  Residual contrast is seen within the colon from prior abdominal CT. No evidence of enteric obstruction.  Post right total hip replacement, incompletely imaged.  Lumbar spine degenerative change.  IMPRESSION: 1. Post left-sided double J ureteral stent placement.  No definite opacities overlying the course of the left-sided double J ureteral stent.  2.  Punctate opacity overlying the expected location of the inferior pole of the left kidney may represent the previously identified left sided ureteral stone, now within the inferior aspect of the left renal collecting system, versus radiopaque debris within the overlying colon.  Original Report Authenticated By: Jake Seats, MD   US Renal  03/06/2013   *RADIOLOGY REPORT*  Clinical Data: Follow-up left-sided hydronephrosis.  RENAL/URINARY TRACT ULTRASOUND COMPLETE  Comparison:  CT scan 03/03/2013.  Findings:  Right Kidney:  11.5 cm in length.  Normal renal cortical echogenicity and mild renal cortical thinning.  A mid pole cyst is noted.  No hydronephrosis.  Left Kidney:  15.1 cm in length.  Mild hydronephrosis is present. There is a 6 cm upper pole cyst.  Bladder:  The right ureteral jet is noted.  No left ureteral jet is visualized.  No bladder wall thickening or mass.  Additional findings:  A right pleural effusion is noted.  Calcified granulomas are noted in  the spleen.  IMPRESSION:  1.  Persistent mild left-sided hydronephrosis likely due to the left ureteral calculus seen on the prior CT scan. 2.  Bilateral renal cysts.   Original Report Authenticated By: Marijo Sanes, M.D.   Dg Abd Acute W/chest  03/05/2013   *RADIOLOGY REPORT*  Clinical Data: Worsening abdominal pain  ACUTE ABDOMEN SERIES (ABDOMEN 2 VIEW & CHEST 1 VIEW)  Comparison: CT abdomen pelvis 03/03/2013  Findings: Mild cardiomegaly.  Normal pulmonary vascularity.  Lungs are clear.  There is no contrast material in the colon from the CT examination 2 days ago.  There is mild distention of the colon without evidence of obstruction.  Oral contrast is seen in the rectum.  No dilated loops of small bowel.  No radiopaque urinary tract calculi are seen, but evaluation is markedly limited by colonic contrast.  No free intraperitoneal air is seen.  IMPRESSION:  1.  Nonobstructive bowel gas pattern.  There is mild distention of the colon diffusely suggesting mild colonic ileus. 2.  Oral contrast material is seen in the colon from recent CT. 3.  The proximal left ureteral stone seen on the CT stone study 03/03/2013 is not visible on today's radiographs, and may be obscured by bowel contrast.  Findings discussed with the patient's nurse, Lattie Haw, 11:30 p.m. 03/05/2013.   Original Report Authenticated By: Curlene Dolphin, M.D.    History of Present Illness: Patient is an 79 year old male admitted with abdominal pain  Hospital Course: Mr Mays was admitted initially with enteritis, due to abdominal pain, had poor PO intake.  Followup view of his abdomen by plain film localized a kidney stone of 1cm size.  Urology consult was called and he subsequently had a ureteral stent placed as the stone did not pass.  Plan for outpt lithotripsy per [facility] in the next week.  Was also consulted on by Renal due to higher Creatinine.  Normally followed in their office for CKD.  With hydration and relief of obstruction, his renal  function is approaching baseline.  He has had some issues with having a BM as well and was given Miralax with 3 loose BM's the day prior to discharge.  He focuses on this since his mother died of bowel obstruction.  We discussed his usual q2d BM regimen and taking laxatives such as colace and miralax as needed to maintain this.  Plan discussed with he and his wife and he is agreeable for discharge at this time.  Day of Discharge Exam BP 159/71  Pulse 69  Temp(Src) 98.7 F (37.1 C) (Oral)  Resp 20  Ht $R'6\' 2"'eH$  (1.88 m)  Wt 90.719 kg (200 lb)  BMI 25.67 kg/m2  SpO2 96%  Physical Exam: General appearance: alert, cooperative and appears stated age Eyes:  no scleral icterus Throat: oropharynx moist without erythema Resp: clear to auscultation bilaterally Cardio: regular rate and rhythm, S1, S2 normal, no murmur, click, rub or gallop GI: soft, non-tender; bowel sounds normal; no masses,  no organomegaly Extremities: no clubbing, cyanosis or edema  Discharge Labs:  Recent Labs  03/08/13 0550 03/09/13 0435  NA 139 137  K 3.4* 3.6  CL 108 104  CO2 25 25  GLUCOSE 115* 137*  BUN 27* 23  CREATININE 2.06* 1.85*  CALCIUM 8.6 8.5   No results found for this basename: AST, ALT, ALKPHOS, BILITOT, PROT, ALBUMIN,  in the last 72 hours  Recent Labs  03/08/13 0550 03/09/13 0435  WBC 7.7 9.0  HGB 10.4* 11.1*  HCT 31.6* 33.7*  MCV 88.5 88.5  PLT 148* 151   Lab Results  Component Value Date   INR 1.12 01/31/2012   INR 0.9 03/19/2007    Discharge instructions:  Push fluids, Colace and Miralax as needed for BM q2days   Disposition: Home Follow-up Appts: Follow-up with Dr. Osborne Casco at [facility] Friday 9/12 at 2:30pm Condition on Discharge: Stable Tests Needing Follow-up:Renal function at OV Signed: Andreana Klingerman W 03/09/2013, 7:43 AM

## 2013-03-10 ENCOUNTER — Other Ambulatory Visit: Payer: Self-pay | Admitting: Urology

## 2013-03-11 ENCOUNTER — Encounter (HOSPITAL_COMMUNITY): Payer: Self-pay | Admitting: Pharmacy Technician

## 2013-03-11 ENCOUNTER — Other Ambulatory Visit: Payer: Self-pay | Admitting: Urology

## 2013-03-12 ENCOUNTER — Encounter (HOSPITAL_COMMUNITY): Payer: Self-pay | Admitting: *Deleted

## 2013-03-12 NOTE — Pre-Procedure Instructions (Signed)
No aspirin, ibuprofen, vitamins or herbal medication or any drug on restricted med list as per [facility]. Bring blue folder to SS day of procedure. Laxative as directed by doctor day prior and plenty of fluids. Light dinner . NPO past MN.

## 2013-03-13 DIAGNOSIS — E1129 Type 2 diabetes mellitus with other diabetic kidney complication: Secondary | ICD-10-CM | POA: Diagnosis not present

## 2013-03-13 DIAGNOSIS — N184 Chronic kidney disease, stage 4 (severe): Secondary | ICD-10-CM | POA: Diagnosis not present

## 2013-03-16 ENCOUNTER — Ambulatory Visit (HOSPITAL_COMMUNITY): Payer: Medicare Other

## 2013-03-16 ENCOUNTER — Ambulatory Visit (HOSPITAL_COMMUNITY)
Admission: RE | Admit: 2013-03-16 | Discharge: 2013-03-16 | Disposition: A | Discharge status: Home / Self Care | Payer: Medicare Other | Source: Ambulatory Visit | Attending: Urology | Admitting: Urology

## 2013-03-16 ENCOUNTER — Encounter (HOSPITAL_COMMUNITY)
Admission: RE | Disposition: A | Discharge status: Home / Self Care | Payer: Self-pay | Source: Ambulatory Visit | Attending: Urology

## 2013-03-16 ENCOUNTER — Encounter (HOSPITAL_COMMUNITY): Payer: Self-pay | Admitting: *Deleted

## 2013-03-16 DIAGNOSIS — Z7982 Long term (current) use of aspirin: Secondary | ICD-10-CM | POA: Diagnosis not present

## 2013-03-16 DIAGNOSIS — Z96649 Presence of unspecified artificial hip joint: Secondary | ICD-10-CM | POA: Insufficient documentation

## 2013-03-16 DIAGNOSIS — Z79899 Other long term (current) drug therapy: Secondary | ICD-10-CM | POA: Diagnosis not present

## 2013-03-16 DIAGNOSIS — K219 Gastro-esophageal reflux disease without esophagitis: Secondary | ICD-10-CM | POA: Insufficient documentation

## 2013-03-16 DIAGNOSIS — I252 Old myocardial infarction: Secondary | ICD-10-CM | POA: Diagnosis not present

## 2013-03-16 DIAGNOSIS — N1330 Unspecified hydronephrosis: Secondary | ICD-10-CM | POA: Diagnosis not present

## 2013-03-16 DIAGNOSIS — N201 Calculus of ureter: Secondary | ICD-10-CM | POA: Diagnosis not present

## 2013-03-16 DIAGNOSIS — I10 Essential (primary) hypertension: Secondary | ICD-10-CM | POA: Diagnosis not present

## 2013-03-16 DIAGNOSIS — Z01818 Encounter for other preprocedural examination: Secondary | ICD-10-CM | POA: Diagnosis not present

## 2013-03-16 DIAGNOSIS — Z794 Long term (current) use of insulin: Secondary | ICD-10-CM | POA: Insufficient documentation

## 2013-03-16 DIAGNOSIS — E119 Type 2 diabetes mellitus without complications: Secondary | ICD-10-CM | POA: Diagnosis not present

## 2013-03-16 DIAGNOSIS — E7800 Pure hypercholesterolemia, unspecified: Secondary | ICD-10-CM | POA: Diagnosis not present

## 2013-03-16 DIAGNOSIS — Z87442 Personal history of urinary calculi: Secondary | ICD-10-CM | POA: Diagnosis present

## 2013-03-16 DIAGNOSIS — N200 Calculus of kidney: Secondary | ICD-10-CM

## 2013-03-16 SURGERY — LITHOTRIPSY, ESWL
Anesthesia: LOCAL | Laterality: Left

## 2013-03-16 MED ORDER — DIPHENHYDRAMINE HCL 25 MG PO CAPS
25.0000 mg | ORAL_CAPSULE | ORAL | Status: AC
  Administered 2013-03-16: 25 mg via ORAL
  Filled 2013-03-16: qty 1

## 2013-03-16 MED ORDER — SODIUM CHLORIDE 0.9 % IV SOLN
INTRAVENOUS | Status: DC
  Administered 2013-03-16: 08:00:00 via INTRAVENOUS

## 2013-03-16 MED ORDER — CIPROFLOXACIN HCL 500 MG PO TABS
500.0000 mg | ORAL_TABLET | ORAL | Status: AC
  Administered 2013-03-16: 500 mg via ORAL
  Filled 2013-03-16: qty 1

## 2013-03-16 MED ORDER — HYDROCODONE-ACETAMINOPHEN 10-325 MG PO TABS: 1.0000 | ORAL_TABLET | ORAL | Status: DC | PRN

## 2013-03-16 MED ORDER — TAMSULOSIN HCL 0.4 MG PO CAPS: 0.4000 mg | ORAL_CAPSULE | ORAL | Status: DC

## 2013-03-16 MED ORDER — DIAZEPAM 5 MG PO TABS
10.0000 mg | ORAL_TABLET | ORAL | Status: AC
  Administered 2013-03-16: 10 mg via ORAL
  Filled 2013-03-16: qty 2

## 2013-03-16 NOTE — H&P (View-Only) (Signed)
Urology Consult   Physician requesting consult: Holwerda  Reason for consult: 24mm left proximal ureteral stone with mild hydronephrosis  History of Present Illness: Victor Wallace is a 79 y.o. cauc male with PMH significant for renal insufficiency, MI, HTN, DM, and GERD who presented on 03/03/13 with c/o several days of worsening constipation, abdominal pain, fevers, chills, and weakness.  His Cr was elevated and he had a leukocytosis with a WBC of 26.5.  He was admitted and started on IVF.  His UA was noted to be positive and he was started on Cipro (urine culture was not obtained until after the pt had received several doses of ABx and has returned no growth). CT scan on 03/03/13 revealed a possible enteritis for which the pt was started on Flagyl.  This also showed a 48mm proximal left ureteral stone with mild hydro which was confirmed/unchanged on RUS on 03/06/13.  Pt also has bilateral renal cysts.    His Cr has trended down with IVF and his WBC has normalized on IV Abx.  Last night the pt developed left flank and low back discomfort in addition to his persistent abdominal pain.  Urology consult was obtained for treatment of left ureteral stone and hydro.  He denies a history of voiding or storage urinary symptoms, hematuria, STDs, urolithiasis, GU malignancy/trauma/surgery.  He was treated for one other uncomplicated UTI last summer.  He is currently resting comfortably.  He denies F/C, HA, CP, SOB, N/V, diarrhea, dysuria, difficulty voiding, and hematuria. He does still have mild abd and low back discomfort.    Past Medical History  Diagnosis Date  . MI (myocardial infarction)   . Hypertension   . Diabetes mellitus   . Hypercholesterolemia   . GERD (gastroesophageal reflux disease)     Past Surgical History  Procedure Laterality Date  . Hip surgery    . Tonsillectomy      Current Hospital Medications:  Home Meds:    Medication List    ASK your doctor about these medications        aspirin EC 81 MG tablet  Take 162 mg by mouth daily.     atorvastatin 40 MG tablet  Commonly known as:  LIPITOR  Take 40 mg by mouth daily.     cycloSPORINE 0.05 % ophthalmic emulsion  Commonly known as:  RESTASIS  Place 2 drops into both eyes 2 (two) times daily.     fish oil-omega-3 fatty acids 1000 MG capsule  Take 1 capsule (1 g total) by mouth daily. Stop taking if having diarrhea     insulin aspart 100 UNIT/ML injection  Commonly known as:  novoLOG  Inject 0-15 Units into the skin 3 (three) times daily with meals.     insulin detemir 100 UNIT/ML injection  Commonly known as:  LEVEMIR  Inject 14 Units into the skin at bedtime.     isosorbide mononitrate 30 MG 24 hr tablet  Commonly known as:  IMDUR  Take 30 mg by mouth daily.     linagliptin 5 MG Tabs tablet  Commonly known as:  TRADJENTA  Take 1 tablet (5 mg total) by mouth daily.     multivitamin with minerals tablet  Take 2 tablets by mouth daily.     PRESERVISION AREDS 2 Caps  Take 1 capsule by mouth 2 (two) times daily.     omeprazole 20 MG capsule  Commonly known as:  PRILOSEC  Take 20 mg by mouth every other day.  polyethylene glycol packet  Commonly known as:  MIRALAX / GLYCOLAX  Take 17 g by mouth 2 (two) times daily as needed (for constipation).     timolol 0.5 % ophthalmic solution  Commonly known as:  BETIMOL  Place 1 drop into the left eye daily.     vitamin B-6 500 MG tablet  Take 500 mg by mouth daily.        Scheduled Meds: . aspirin EC  162 mg Oral Daily  . atorvastatin  40 mg Oral Daily  . ciprofloxacin  500 mg Oral Q breakfast  . cycloSPORINE  2 drop Both Eyes BID  . docusate sodium  100 mg Oral BID  . enoxaparin (LOVENOX) injection  30 mg Subcutaneous Q24H  . insulin aspart  0-15 Units Subcutaneous TID WC  . insulin aspart  0-5 Units Subcutaneous QHS  . insulin detemir  18 Units Subcutaneous QHS  . isosorbide mononitrate  30 mg Oral Daily  . linagliptin  5 mg Oral Daily   . metroNIDAZOLE  500 mg Oral Q8H  . multivitamin with minerals  2 tablet Oral Daily  . omega-3 acid ethyl esters  1 g Oral Daily  . pantoprazole  40 mg Oral Daily  . polyethylene glycol  17 g Oral Daily  . vitamin B-6  500 mg Oral Daily  . senna  1 tablet Oral BID  . timolol  1 drop Left Eye Daily   Continuous Infusions:  PRN Meds:.alum & mag hydroxide-simeth, HYDROcodone-acetaminophen, morphine injection, ondansetron (ZOFRAN) IV, ondansetron, zolpidem  Allergies: No Known Allergies  No family history on file.  Social History:  reports that he has never smoked. He does not have any smokeless tobacco history on file. He reports that  drinks alcohol. He reports that he does not use illicit drugs.  ROS: A complete review of systems was performed.  All systems are negative except for pertinent findings as noted.  Physical Exam:  Vital signs in last 24 hours: Temp:  [97.6 F (36.4 C)-99.6 F (37.6 C)] 98.7 F (37.1 C) (09/05 1014) Pulse Rate:  [68-77] 72 (09/05 1014) Resp:  [18-22] 18 (09/05 1014) BP: (115-167)/(49-88) 164/88 mmHg (09/05 1014) SpO2:  [96 %-99 %] 96 % (09/05 1014) Weight:  [89.1 kg (196 lb 6.9 oz)-91.4 kg (201 lb 8 oz)] 91.4 kg (201 lb 8 oz) (09/05 0538) Constitutional:  Alert and oriented, No acute distress Cardiovascular: Regular rate and rhythm Respiratory: Normal respiratory effort, Lungs clear bilaterally GI: Abdomen is soft, nontender, nondistended, no abdominal masses GU: No CVA tenderness Lymphatic: No lymphadenopathy Neurologic: Grossly intact, no focal deficits Psychiatric: Normal mood and affect  Laboratory Data:   Recent Labs  03/04/13 0404 03/05/13 0420 03/06/13 0432  WBC 26.5* 12.3* 9.9  HGB 11.1* 10.2* 10.2*  HCT 33.6* 31.7* 31.1*  PLT 154 PLATELET CLUMPS NOTED ON SMEAR, COUNT APPEARS ADEQUATE 127*     Recent Labs  03/03/13 1814 03/04/13 0404 03/05/13 0420 03/06/13 0432  NA 133* 132* 135 133*  K 4.3 4.9 3.9 4.2  CL 99 100 104  105  GLUCOSE 325* 357* 128* 187*  BUN 38* 41* 47* 40*  CALCIUM 9.0 8.3* 8.1* 8.1*  CREATININE 2.52* 2.79* 3.10* 2.93*     Results for orders placed during the hospital encounter of 03/03/13 (from the past 24 hour(s))  GLUCOSE, CAPILLARY     Status: Abnormal   Collection Time    03/05/13 11:26 AM      Result Value Range   Glucose-Capillary 144 (*)  70 - 99 mg/dL   Comment 1 Notify RN    GLUCOSE, CAPILLARY     Status: Abnormal   Collection Time    03/05/13  4:30 PM      Result Value Range   Glucose-Capillary 193 (*) 70 - 99 mg/dL   Comment 1 Notify RN    GLUCOSE, CAPILLARY     Status: Abnormal   Collection Time    03/05/13  9:50 PM      Result Value Range   Glucose-Capillary 166 (*) 70 - 99 mg/dL  BASIC METABOLIC PANEL     Status: Abnormal   Collection Time    03/06/13  4:32 AM      Result Value Range   Sodium 133 (*) 135 - 145 mEq/L   Potassium 4.2  3.5 - 5.1 mEq/L   Chloride 105  96 - 112 mEq/L   CO2 21  19 - 32 mEq/L   Glucose, Bld 187 (*) 70 - 99 mg/dL   BUN 40 (*) 6 - 23 mg/dL   Creatinine, Ser 2.93 (*) 0.50 - 1.35 mg/dL   Calcium 8.1 (*) 8.4 - 10.5 mg/dL   GFR calc non Af Amer 18 (*) >90 mL/min   GFR calc Af Amer 21 (*) >90 mL/min  CBC     Status: Abnormal   Collection Time    03/06/13  4:32 AM      Result Value Range   WBC 9.9  4.0 - 10.5 K/uL   RBC 3.45 (*) 4.22 - 5.81 MIL/uL   Hemoglobin 10.2 (*) 13.0 - 17.0 g/dL   HCT 31.1 (*) 39.0 - 52.0 %   MCV 90.1  78.0 - 100.0 fL   MCH 29.6  26.0 - 34.0 pg   MCHC 32.8  30.0 - 36.0 g/dL   RDW 14.1  11.5 - 15.5 %   Platelets 127 (*) 150 - 400 K/uL  GLUCOSE, CAPILLARY     Status: Abnormal   Collection Time    03/06/13  7:29 AM      Result Value Range   Glucose-Capillary 154 (*) 70 - 99 mg/dL   Comment 1 Notify RN     Recent Results (from the past 240 hour(s))  URINE CULTURE     Status: None   Collection Time    03/04/13  5:30 PM      Result Value Range Status   Specimen Description URINE, CLEAN CATCH   Final    Special Requests cipro and flagyl   Final   Culture  Setup Time     Final   Value: 03/04/2013 22:17     Performed at [place]     Final   Value: NO GROWTH     Performed at [facility]   Culture     Final   Value: NO GROWTH     Performed at [facility]   Report Status 03/05/2013 FINAL   Final    Renal Function:  Recent Labs  03/03/13 1814 03/04/13 0404 03/05/13 0420 03/06/13 0432  CREATININE 2.52* 2.79* 3.10* 2.93*   Estimated Creatinine Clearance: 21 ml/min (by C-G formula based on Cr of 2.93).  Radiologic Imaging:  Addendum    Sheppard Evens, MD Thu Mar 05, 2013 11:36:49 PM EDT       **ADDENDUM** CREATED: 03/05/2013 23:33:12  CT abdomen pelvis performed 03/03/2013 was reviewed by myself today  while interpreting an acute abdominal series performed 03/05/2013.  It is noted  that a 5 mm proximal left ureteral stone seen on this  CT results in proximal mild left hydroureteronephrosis. This  finding was called to the patient's nurse, Lattie Haw, at 11:30 p.m.  03/03/2013. She will relay this information to the physician  taking care of the patient.  **END ADDENDUM** SIGNED BY: Curlene Dolphin, M.D.      Study Result    *RADIOLOGY REPORT*  Clinical Data: Vomiting. Abdominal pain. Constipation.  CT ABDOMEN AND PELVIS WITHOUT CONTRAST  Technique: Multidetector CT imaging of the abdomen and pelvis was  performed following the standard protocol without intravenous  contrast.  Comparison: No priors.  Findings:  Lung Bases: Scarring or subsegmental atelectasis in the right lower  lobe posteriorly. Calcifications of the aortic valve.  Abdomen/Pelvis: Multiple calcifications within the liver and  spleen, compatible with calcified granulomas. Subcentimeter low  attenuation lesion in segment 4A of the liver is incompletely  characterized on today's noncontrast CT examination. The  unenhanced appearance of the gallbladder and bilateral  adrenal  glands is unremarkable. In the head of the pancreas there is a  poorly defined 2.4 x 1.6 cm low attenuation lesion which is  incompletely characterized. 2.1 x 2.8 cm low attenuation lesion in  the anterior aspect of the spleen is also incompletely  characterized. A 3.4 cm intermediate attenuation (22 HU) lesion  extending exophytically off the medial aspect of the interpolar  region of the right kidney, with additional subcentimeter low  attenuation lesion immediately above that, both incompletely  characterized. Large exophytic rim calcified 6.6 x 4.6 cm low  attenuation lesion extending off the interpolar region of the left  kidney posteriorly.  Extensive atherosclerosis of the abdominal and pelvic vasculature,  without evidence of frank aneurysm. No significant volume of  ascites. No pneumoperitoneum. No pathologic distension of small  bowel. No definite pathologic lymphadenopathy identified within  the abdomen or pelvis on today's noncontrast CT examination.  Normal appendix. The urinary bladder is unremarkable in  appearance. There is some thickening of some small bowel loops,  most pronounced in the proximal jejunum, which could suggest  inflammation.  Musculoskeletal: Old healed fractures of the inferior pubic rami  bilaterally. Status post right total hip arthroplasty. There are  no aggressive appearing lytic or blastic lesions noted in the  visualized portions of the skeleton.  IMPRESSION:  1. Mild thickening of the proximal small bowel in the region of  the proximal jejunum. This is nonspecific, but could suggest  infection or inflammation (i.e., enteritis). Clinical correlation  is recommended.  2. 2.4 x 1.6 cm low attenuation lesion in the head of the  pancreas, low attenuation splenic lesion, and numerous bilateral  renal lesions (two of which appeared complex) are all incompletely  characterized on today's noncontrast CT examination. Although these  may simply  all represent benign lesions such as cysts, further  evaluation of the abdomen with non emergent MRI with and without IV  gadolinium is recommended to fully characterize these lesions, as  any one of these could potentially be neoplastic.  3. Extensive atherosclerosis.  4. Sequelae of old granulomatous disease with numerous calcified  granulomas in the liver and spleen.  5. Compression fracture at L1 with approximately 20% loss of  anterior vertebral body height which appears old.  6. Additional incidental findings, as above.  Original Report Authenticated By: Vinnie Langton, M.D.    US Renal  03/06/2013   *RADIOLOGY REPORT*  Clinical Data: Follow-up left-sided hydronephrosis.  RENAL/URINARY TRACT ULTRASOUND COMPLETE  Comparison:  CT scan 03/03/2013.  Findings:  Right Kidney:  11.5 cm in length.  Normal renal cortical echogenicity and mild renal cortical thinning.  A mid pole cyst is noted.  No hydronephrosis.  Left Kidney:  15.1 cm in length.  Mild hydronephrosis is present. There is a 6 cm upper pole cyst.  Bladder:  The right ureteral jet is noted.  No left ureteral jet is visualized.  No bladder wall thickening or mass.  Additional findings:  A right pleural effusion is noted.  Calcified granulomas are noted in the spleen.  IMPRESSION:  1.  Persistent mild left-sided hydronephrosis likely due to the left ureteral calculus seen on the prior CT scan. 2.  Bilateral renal cysts.   Original Report Authenticated By: Marijo Sanes, M.D.   Dg Abd Acute W/chest  03/05/2013   *RADIOLOGY REPORT*  Clinical Data: Worsening abdominal pain  ACUTE ABDOMEN SERIES (ABDOMEN 2 VIEW & CHEST 1 VIEW)  Comparison: CT abdomen pelvis 03/03/2013  Findings: Mild cardiomegaly.  Normal pulmonary vascularity.  Lungs are clear.  There is no contrast material in the colon from the CT examination 2 days ago.  There is mild distention of the colon without evidence of obstruction.  Oral contrast is seen in the rectum.  No dilated  loops of small bowel.  No radiopaque urinary tract calculi are seen, but evaluation is markedly limited by colonic contrast.  No free intraperitoneal air is seen.  IMPRESSION:  1.  Nonobstructive bowel gas pattern.  There is mild distention of the colon diffusely suggesting mild colonic ileus. 2.  Oral contrast material is seen in the colon from recent CT. 3.  The proximal left ureteral stone seen on the CT stone study 03/03/2013 is not visible on today's radiographs, and may be obscured by bowel contrast.  Findings discussed with the patient's nurse, Lattie Haw, 11:30 p.m. 03/05/2013.   Original Report Authenticated By: Curlene Dolphin, M.D.    Impression/Recommendation  Proximal 73mm left ureteral stone with mild hydroureteronephrosis in pt with renal isufficiency-- pt will have cysto, possible bilateral retrogrades, left ureteral stenting, and possible left ureteral stone manipulation.    Pt should have PVR checked to ensure he is emptying his bladder completely as he has had several UTIs.  These are likely due to dehydration but for completeness this should be evaled.  This can be done as an outpt.    Marcie Bal 03/06/2013, 10:50 AM

## 2013-03-16 NOTE — Interval H&P Note (Signed)
History and Physical Interval Note:  03/16/2013 8:57 AM  Victor Wallace  has presented today for surgery, with the diagnosis of Left ureterall Stone. Dr. Jeffie Pollock had placed a stent on the left and the pts Cr. Improved. The stone was initially pushed back into the kidney but now has migrated back down the ureter with the stent remaining in good position.   The various methods of treatment have been discussed with the patient and family. After consideration of risks, benefits and other options for treatment, the patient has consented to  Procedure(s): LEFT EXTRACORPOREAL SHOCK WAVE LITHOTRIPSY (ESWL) (Left) as a surgical intervention .  The patient's history has been reviewed, patient examined, no change in status, stable for surgery.  I have reviewed the patient's chart and labs.  Questions were answered to the patient's satisfaction.     Claybon Jabs

## 2013-03-16 NOTE — H&P (View-Only) (Signed)
I have reviewed the history and examined the patient and concur with the above information.   He will have a left ureteral stent placed today.   I reviewed the risks of bleeding, infection, ureteral injury, need for secondary procedures, thrombotic events and anesthetic complications.

## 2013-03-16 NOTE — Op Note (Signed)
See [facility] OP note scanned into chart. 

## 2013-03-17 LAB — GLUCOSE, CAPILLARY: Glucose-Capillary: 131 mg/dL — ABNORMAL HIGH (ref 70–99)

## 2013-03-20 DIAGNOSIS — S336XXA Sprain of sacroiliac joint, initial encounter: Secondary | ICD-10-CM | POA: Diagnosis not present

## 2013-03-20 DIAGNOSIS — M999 Biomechanical lesion, unspecified: Secondary | ICD-10-CM | POA: Diagnosis not present

## 2013-03-24 DIAGNOSIS — N201 Calculus of ureter: Secondary | ICD-10-CM | POA: Diagnosis not present

## 2013-03-25 DIAGNOSIS — S336XXA Sprain of sacroiliac joint, initial encounter: Secondary | ICD-10-CM | POA: Diagnosis not present

## 2013-03-25 DIAGNOSIS — M999 Biomechanical lesion, unspecified: Secondary | ICD-10-CM | POA: Diagnosis not present

## 2013-03-26 DIAGNOSIS — H353190 Nonexudative age-related macular degeneration, unspecified eye, stage unspecified: Secondary | ICD-10-CM | POA: Diagnosis not present

## 2013-03-26 DIAGNOSIS — Z961 Presence of intraocular lens: Secondary | ICD-10-CM | POA: Diagnosis not present

## 2013-03-26 DIAGNOSIS — H4011X0 Primary open-angle glaucoma, stage unspecified: Secondary | ICD-10-CM | POA: Diagnosis not present

## 2013-03-26 DIAGNOSIS — E119 Type 2 diabetes mellitus without complications: Secondary | ICD-10-CM | POA: Diagnosis not present

## 2013-03-27 DIAGNOSIS — M999 Biomechanical lesion, unspecified: Secondary | ICD-10-CM | POA: Diagnosis not present

## 2013-03-27 DIAGNOSIS — S336XXA Sprain of sacroiliac joint, initial encounter: Secondary | ICD-10-CM | POA: Diagnosis not present

## 2013-04-01 DIAGNOSIS — M999 Biomechanical lesion, unspecified: Secondary | ICD-10-CM | POA: Diagnosis not present

## 2013-04-01 DIAGNOSIS — S336XXA Sprain of sacroiliac joint, initial encounter: Secondary | ICD-10-CM | POA: Diagnosis not present

## 2013-04-07 DIAGNOSIS — N184 Chronic kidney disease, stage 4 (severe): Secondary | ICD-10-CM | POA: Diagnosis not present

## 2013-04-07 DIAGNOSIS — IMO0002 Reserved for concepts with insufficient information to code with codable children: Secondary | ICD-10-CM | POA: Diagnosis not present

## 2013-04-07 DIAGNOSIS — M1990 Unspecified osteoarthritis, unspecified site: Secondary | ICD-10-CM | POA: Diagnosis not present

## 2013-04-07 DIAGNOSIS — E785 Hyperlipidemia, unspecified: Secondary | ICD-10-CM | POA: Diagnosis not present

## 2013-04-07 DIAGNOSIS — E1129 Type 2 diabetes mellitus with other diabetic kidney complication: Secondary | ICD-10-CM | POA: Diagnosis not present

## 2013-04-07 DIAGNOSIS — I2510 Atherosclerotic heart disease of native coronary artery without angina pectoris: Secondary | ICD-10-CM | POA: Diagnosis not present

## 2013-04-07 DIAGNOSIS — G609 Hereditary and idiopathic neuropathy, unspecified: Secondary | ICD-10-CM | POA: Diagnosis not present

## 2013-04-07 DIAGNOSIS — N200 Calculus of kidney: Secondary | ICD-10-CM | POA: Diagnosis not present

## 2013-04-08 DIAGNOSIS — S336XXA Sprain of sacroiliac joint, initial encounter: Secondary | ICD-10-CM | POA: Diagnosis not present

## 2013-04-08 DIAGNOSIS — M999 Biomechanical lesion, unspecified: Secondary | ICD-10-CM | POA: Diagnosis not present

## 2013-04-22 DIAGNOSIS — M999 Biomechanical lesion, unspecified: Secondary | ICD-10-CM | POA: Diagnosis not present

## 2013-04-22 DIAGNOSIS — S336XXA Sprain of sacroiliac joint, initial encounter: Secondary | ICD-10-CM | POA: Diagnosis not present

## 2013-04-23 DIAGNOSIS — N201 Calculus of ureter: Secondary | ICD-10-CM | POA: Diagnosis not present

## 2013-05-07 ENCOUNTER — Other Ambulatory Visit: Payer: Self-pay

## 2013-06-01 DIAGNOSIS — N200 Calculus of kidney: Secondary | ICD-10-CM | POA: Diagnosis not present

## 2013-06-01 DIAGNOSIS — Z0000 Encounter for general adult medical examination without abnormal findings: Secondary | ICD-10-CM | POA: Diagnosis not present

## 2013-07-06 ENCOUNTER — Encounter (INDEPENDENT_AMBULATORY_CARE_PROVIDER_SITE_OTHER): Payer: Medicare Other | Admitting: Ophthalmology

## 2013-07-06 DIAGNOSIS — H35039 Hypertensive retinopathy, unspecified eye: Secondary | ICD-10-CM | POA: Diagnosis not present

## 2013-07-06 DIAGNOSIS — H3530 Unspecified macular degeneration: Secondary | ICD-10-CM | POA: Diagnosis not present

## 2013-07-06 DIAGNOSIS — H33309 Unspecified retinal break, unspecified eye: Secondary | ICD-10-CM

## 2013-07-06 DIAGNOSIS — H43819 Vitreous degeneration, unspecified eye: Secondary | ICD-10-CM

## 2013-07-06 DIAGNOSIS — I10 Essential (primary) hypertension: Secondary | ICD-10-CM | POA: Diagnosis not present

## 2013-07-20 ENCOUNTER — Ambulatory Visit (INDEPENDENT_AMBULATORY_CARE_PROVIDER_SITE_OTHER): Payer: Medicare Other | Admitting: Ophthalmology

## 2013-07-20 DIAGNOSIS — H33309 Unspecified retinal break, unspecified eye: Secondary | ICD-10-CM

## 2013-07-21 DIAGNOSIS — Z6826 Body mass index (BMI) 26.0-26.9, adult: Secondary | ICD-10-CM | POA: Diagnosis not present

## 2013-07-21 DIAGNOSIS — E1129 Type 2 diabetes mellitus with other diabetic kidney complication: Secondary | ICD-10-CM | POA: Diagnosis not present

## 2013-07-21 DIAGNOSIS — N184 Chronic kidney disease, stage 4 (severe): Secondary | ICD-10-CM | POA: Diagnosis not present

## 2013-07-21 DIAGNOSIS — I2510 Atherosclerotic heart disease of native coronary artery without angina pectoris: Secondary | ICD-10-CM | POA: Diagnosis not present

## 2013-07-28 DIAGNOSIS — N183 Chronic kidney disease, stage 3 unspecified: Secondary | ICD-10-CM | POA: Diagnosis not present

## 2013-07-28 DIAGNOSIS — I10 Essential (primary) hypertension: Secondary | ICD-10-CM | POA: Diagnosis not present

## 2013-07-28 DIAGNOSIS — I129 Hypertensive chronic kidney disease with stage 1 through stage 4 chronic kidney disease, or unspecified chronic kidney disease: Secondary | ICD-10-CM | POA: Diagnosis not present

## 2013-08-05 DIAGNOSIS — E875 Hyperkalemia: Secondary | ICD-10-CM | POA: Diagnosis not present

## 2013-08-06 DIAGNOSIS — Z961 Presence of intraocular lens: Secondary | ICD-10-CM | POA: Diagnosis not present

## 2013-08-06 DIAGNOSIS — I10 Essential (primary) hypertension: Secondary | ICD-10-CM | POA: Diagnosis not present

## 2013-08-06 DIAGNOSIS — H4011X0 Primary open-angle glaucoma, stage unspecified: Secondary | ICD-10-CM | POA: Diagnosis not present

## 2013-08-06 DIAGNOSIS — E109 Type 1 diabetes mellitus without complications: Secondary | ICD-10-CM | POA: Diagnosis not present

## 2013-09-01 DIAGNOSIS — I10 Essential (primary) hypertension: Secondary | ICD-10-CM | POA: Diagnosis not present

## 2013-09-01 DIAGNOSIS — N189 Chronic kidney disease, unspecified: Secondary | ICD-10-CM | POA: Diagnosis not present

## 2013-09-04 DIAGNOSIS — H04129 Dry eye syndrome of unspecified lacrimal gland: Secondary | ICD-10-CM | POA: Diagnosis not present

## 2013-09-04 DIAGNOSIS — H4011X0 Primary open-angle glaucoma, stage unspecified: Secondary | ICD-10-CM | POA: Diagnosis not present

## 2013-09-04 DIAGNOSIS — Z9849 Cataract extraction status, unspecified eye: Secondary | ICD-10-CM | POA: Diagnosis not present

## 2013-09-04 DIAGNOSIS — H11159 Pinguecula, unspecified eye: Secondary | ICD-10-CM | POA: Diagnosis not present

## 2013-09-09 DIAGNOSIS — L570 Actinic keratosis: Secondary | ICD-10-CM | POA: Diagnosis not present

## 2013-09-09 DIAGNOSIS — L821 Other seborrheic keratosis: Secondary | ICD-10-CM | POA: Diagnosis not present

## 2013-09-09 DIAGNOSIS — L219 Seborrheic dermatitis, unspecified: Secondary | ICD-10-CM | POA: Diagnosis not present

## 2013-09-09 DIAGNOSIS — D1801 Hemangioma of skin and subcutaneous tissue: Secondary | ICD-10-CM | POA: Diagnosis not present

## 2013-09-09 DIAGNOSIS — Z85828 Personal history of other malignant neoplasm of skin: Secondary | ICD-10-CM | POA: Diagnosis not present

## 2013-09-14 DIAGNOSIS — H52229 Regular astigmatism, unspecified eye: Secondary | ICD-10-CM | POA: Diagnosis not present

## 2013-09-14 DIAGNOSIS — H524 Presbyopia: Secondary | ICD-10-CM | POA: Diagnosis not present

## 2013-09-14 DIAGNOSIS — Z0100 Encounter for examination of eyes and vision without abnormal findings: Secondary | ICD-10-CM | POA: Diagnosis not present

## 2013-09-14 DIAGNOSIS — H4011X0 Primary open-angle glaucoma, stage unspecified: Secondary | ICD-10-CM | POA: Diagnosis not present

## 2013-10-06 DIAGNOSIS — S058X9A Other injuries of unspecified eye and orbit, initial encounter: Secondary | ICD-10-CM | POA: Diagnosis not present

## 2013-10-06 DIAGNOSIS — H04129 Dry eye syndrome of unspecified lacrimal gland: Secondary | ICD-10-CM | POA: Diagnosis not present

## 2013-10-06 DIAGNOSIS — H16229 Keratoconjunctivitis sicca, not specified as Sjogren's, unspecified eye: Secondary | ICD-10-CM | POA: Diagnosis not present

## 2013-10-06 DIAGNOSIS — H4011X0 Primary open-angle glaucoma, stage unspecified: Secondary | ICD-10-CM | POA: Diagnosis not present

## 2013-10-15 DIAGNOSIS — L820 Inflamed seborrheic keratosis: Secondary | ICD-10-CM | POA: Diagnosis not present

## 2013-10-15 DIAGNOSIS — L219 Seborrheic dermatitis, unspecified: Secondary | ICD-10-CM | POA: Diagnosis not present

## 2013-10-15 DIAGNOSIS — Z85828 Personal history of other malignant neoplasm of skin: Secondary | ICD-10-CM | POA: Diagnosis not present

## 2013-11-17 DIAGNOSIS — K219 Gastro-esophageal reflux disease without esophagitis: Secondary | ICD-10-CM | POA: Diagnosis not present

## 2013-11-17 DIAGNOSIS — N184 Chronic kidney disease, stage 4 (severe): Secondary | ICD-10-CM | POA: Diagnosis not present

## 2013-11-17 DIAGNOSIS — E1159 Type 2 diabetes mellitus with other circulatory complications: Secondary | ICD-10-CM | POA: Diagnosis not present

## 2013-11-17 DIAGNOSIS — E785 Hyperlipidemia, unspecified: Secondary | ICD-10-CM | POA: Diagnosis not present

## 2013-11-17 DIAGNOSIS — E1129 Type 2 diabetes mellitus with other diabetic kidney complication: Secondary | ICD-10-CM | POA: Diagnosis not present

## 2013-11-17 DIAGNOSIS — H4011X0 Primary open-angle glaucoma, stage unspecified: Secondary | ICD-10-CM | POA: Diagnosis not present

## 2013-11-17 DIAGNOSIS — G609 Hereditary and idiopathic neuropathy, unspecified: Secondary | ICD-10-CM | POA: Diagnosis not present

## 2013-11-17 DIAGNOSIS — I2510 Atherosclerotic heart disease of native coronary artery without angina pectoris: Secondary | ICD-10-CM | POA: Diagnosis not present

## 2013-11-18 ENCOUNTER — Ambulatory Visit (INDEPENDENT_AMBULATORY_CARE_PROVIDER_SITE_OTHER): Payer: Medicare Other | Admitting: Ophthalmology

## 2013-11-18 DIAGNOSIS — H35039 Hypertensive retinopathy, unspecified eye: Secondary | ICD-10-CM

## 2013-11-18 DIAGNOSIS — I10 Essential (primary) hypertension: Secondary | ICD-10-CM | POA: Diagnosis not present

## 2013-11-18 DIAGNOSIS — H33309 Unspecified retinal break, unspecified eye: Secondary | ICD-10-CM

## 2013-11-18 DIAGNOSIS — H3530 Unspecified macular degeneration: Secondary | ICD-10-CM

## 2013-11-18 DIAGNOSIS — H43819 Vitreous degeneration, unspecified eye: Secondary | ICD-10-CM

## 2013-11-19 DIAGNOSIS — Z6826 Body mass index (BMI) 26.0-26.9, adult: Secondary | ICD-10-CM | POA: Diagnosis not present

## 2013-11-19 DIAGNOSIS — E1129 Type 2 diabetes mellitus with other diabetic kidney complication: Secondary | ICD-10-CM | POA: Diagnosis not present

## 2013-11-19 DIAGNOSIS — I2510 Atherosclerotic heart disease of native coronary artery without angina pectoris: Secondary | ICD-10-CM | POA: Diagnosis not present

## 2013-11-19 DIAGNOSIS — N184 Chronic kidney disease, stage 4 (severe): Secondary | ICD-10-CM | POA: Diagnosis not present

## 2014-02-10 DIAGNOSIS — E119 Type 2 diabetes mellitus without complications: Secondary | ICD-10-CM | POA: Diagnosis not present

## 2014-02-10 DIAGNOSIS — H409 Unspecified glaucoma: Secondary | ICD-10-CM | POA: Diagnosis not present

## 2014-02-10 DIAGNOSIS — H04129 Dry eye syndrome of unspecified lacrimal gland: Secondary | ICD-10-CM | POA: Diagnosis not present

## 2014-02-10 DIAGNOSIS — S058X9A Other injuries of unspecified eye and orbit, initial encounter: Secondary | ICD-10-CM | POA: Diagnosis not present

## 2014-02-10 DIAGNOSIS — H353190 Nonexudative age-related macular degeneration, unspecified eye, stage unspecified: Secondary | ICD-10-CM | POA: Diagnosis not present

## 2014-02-10 DIAGNOSIS — Z961 Presence of intraocular lens: Secondary | ICD-10-CM | POA: Diagnosis not present

## 2014-02-10 DIAGNOSIS — H4011X0 Primary open-angle glaucoma, stage unspecified: Secondary | ICD-10-CM | POA: Diagnosis not present

## 2014-02-15 DIAGNOSIS — E119 Type 2 diabetes mellitus without complications: Secondary | ICD-10-CM | POA: Diagnosis not present

## 2014-02-15 DIAGNOSIS — N2581 Secondary hyperparathyroidism of renal origin: Secondary | ICD-10-CM | POA: Diagnosis not present

## 2014-02-15 DIAGNOSIS — N189 Chronic kidney disease, unspecified: Secondary | ICD-10-CM | POA: Diagnosis not present

## 2014-02-15 DIAGNOSIS — I10 Essential (primary) hypertension: Secondary | ICD-10-CM | POA: Diagnosis not present

## 2014-02-19 DIAGNOSIS — N184 Chronic kidney disease, stage 4 (severe): Secondary | ICD-10-CM | POA: Diagnosis not present

## 2014-02-19 DIAGNOSIS — E1159 Type 2 diabetes mellitus with other circulatory complications: Secondary | ICD-10-CM | POA: Diagnosis not present

## 2014-02-19 DIAGNOSIS — K219 Gastro-esophageal reflux disease without esophagitis: Secondary | ICD-10-CM | POA: Diagnosis not present

## 2014-02-19 DIAGNOSIS — G609 Hereditary and idiopathic neuropathy, unspecified: Secondary | ICD-10-CM | POA: Diagnosis not present

## 2014-02-19 DIAGNOSIS — E1129 Type 2 diabetes mellitus with other diabetic kidney complication: Secondary | ICD-10-CM | POA: Diagnosis not present

## 2014-02-19 DIAGNOSIS — H4011X0 Primary open-angle glaucoma, stage unspecified: Secondary | ICD-10-CM | POA: Diagnosis not present

## 2014-02-19 DIAGNOSIS — E785 Hyperlipidemia, unspecified: Secondary | ICD-10-CM | POA: Diagnosis not present

## 2014-02-19 DIAGNOSIS — I2510 Atherosclerotic heart disease of native coronary artery without angina pectoris: Secondary | ICD-10-CM | POA: Diagnosis not present

## 2014-03-29 DIAGNOSIS — N201 Calculus of ureter: Secondary | ICD-10-CM | POA: Diagnosis not present

## 2014-04-21 DIAGNOSIS — E1129 Type 2 diabetes mellitus with other diabetic kidney complication: Secondary | ICD-10-CM | POA: Diagnosis not present

## 2014-04-21 DIAGNOSIS — Z23 Encounter for immunization: Secondary | ICD-10-CM | POA: Diagnosis not present

## 2014-04-21 DIAGNOSIS — I2510 Atherosclerotic heart disease of native coronary artery without angina pectoris: Secondary | ICD-10-CM | POA: Diagnosis not present

## 2014-04-21 DIAGNOSIS — N184 Chronic kidney disease, stage 4 (severe): Secondary | ICD-10-CM | POA: Diagnosis not present

## 2014-06-07 DIAGNOSIS — Z125 Encounter for screening for malignant neoplasm of prostate: Secondary | ICD-10-CM | POA: Diagnosis not present

## 2014-06-07 DIAGNOSIS — I2510 Atherosclerotic heart disease of native coronary artery without angina pectoris: Secondary | ICD-10-CM | POA: Diagnosis not present

## 2014-06-07 DIAGNOSIS — E1151 Type 2 diabetes mellitus with diabetic peripheral angiopathy without gangrene: Secondary | ICD-10-CM | POA: Diagnosis not present

## 2014-06-07 DIAGNOSIS — E785 Hyperlipidemia, unspecified: Secondary | ICD-10-CM | POA: Diagnosis not present

## 2014-06-07 DIAGNOSIS — Z0000 Encounter for general adult medical examination without abnormal findings: Secondary | ICD-10-CM | POA: Diagnosis not present

## 2014-06-08 DIAGNOSIS — E119 Type 2 diabetes mellitus without complications: Secondary | ICD-10-CM | POA: Diagnosis not present

## 2014-06-08 DIAGNOSIS — N189 Chronic kidney disease, unspecified: Secondary | ICD-10-CM | POA: Diagnosis not present

## 2014-06-08 DIAGNOSIS — I10 Essential (primary) hypertension: Secondary | ICD-10-CM | POA: Diagnosis not present

## 2014-06-08 DIAGNOSIS — N2581 Secondary hyperparathyroidism of renal origin: Secondary | ICD-10-CM | POA: Diagnosis not present

## 2014-06-14 DIAGNOSIS — G629 Polyneuropathy, unspecified: Secondary | ICD-10-CM | POA: Diagnosis not present

## 2014-06-14 DIAGNOSIS — N184 Chronic kidney disease, stage 4 (severe): Secondary | ICD-10-CM | POA: Diagnosis not present

## 2014-06-14 DIAGNOSIS — N200 Calculus of kidney: Secondary | ICD-10-CM | POA: Diagnosis not present

## 2014-06-14 DIAGNOSIS — E1151 Type 2 diabetes mellitus with diabetic peripheral angiopathy without gangrene: Secondary | ICD-10-CM | POA: Diagnosis not present

## 2014-06-14 DIAGNOSIS — E785 Hyperlipidemia, unspecified: Secondary | ICD-10-CM | POA: Diagnosis not present

## 2014-06-14 DIAGNOSIS — E1129 Type 2 diabetes mellitus with other diabetic kidney complication: Secondary | ICD-10-CM | POA: Diagnosis not present

## 2014-06-14 DIAGNOSIS — I454 Nonspecific intraventricular block: Secondary | ICD-10-CM | POA: Diagnosis not present

## 2014-06-14 DIAGNOSIS — Z0000 Encounter for general adult medical examination without abnormal findings: Secondary | ICD-10-CM | POA: Diagnosis not present

## 2014-06-15 DIAGNOSIS — N184 Chronic kidney disease, stage 4 (severe): Secondary | ICD-10-CM | POA: Diagnosis not present

## 2014-06-15 DIAGNOSIS — E1129 Type 2 diabetes mellitus with other diabetic kidney complication: Secondary | ICD-10-CM | POA: Diagnosis not present

## 2014-06-15 DIAGNOSIS — G629 Polyneuropathy, unspecified: Secondary | ICD-10-CM | POA: Diagnosis not present

## 2014-06-15 DIAGNOSIS — Z0000 Encounter for general adult medical examination without abnormal findings: Secondary | ICD-10-CM | POA: Diagnosis not present

## 2014-06-15 DIAGNOSIS — N200 Calculus of kidney: Secondary | ICD-10-CM | POA: Diagnosis not present

## 2014-06-15 DIAGNOSIS — I454 Nonspecific intraventricular block: Secondary | ICD-10-CM | POA: Diagnosis not present

## 2014-06-15 DIAGNOSIS — E785 Hyperlipidemia, unspecified: Secondary | ICD-10-CM | POA: Diagnosis not present

## 2014-06-15 DIAGNOSIS — E1151 Type 2 diabetes mellitus with diabetic peripheral angiopathy without gangrene: Secondary | ICD-10-CM | POA: Diagnosis not present

## 2014-06-21 DIAGNOSIS — Z1212 Encounter for screening for malignant neoplasm of rectum: Secondary | ICD-10-CM | POA: Diagnosis not present

## 2014-07-13 DIAGNOSIS — Z961 Presence of intraocular lens: Secondary | ICD-10-CM | POA: Diagnosis not present

## 2014-07-13 DIAGNOSIS — H04123 Dry eye syndrome of bilateral lacrimal glands: Secondary | ICD-10-CM | POA: Diagnosis not present

## 2014-07-13 DIAGNOSIS — H3530 Unspecified macular degeneration: Secondary | ICD-10-CM | POA: Diagnosis not present

## 2014-07-13 DIAGNOSIS — H4011X1 Primary open-angle glaucoma, mild stage: Secondary | ICD-10-CM | POA: Diagnosis not present

## 2014-07-13 DIAGNOSIS — S0500XD Injury of conjunctiva and corneal abrasion without foreign body, unspecified eye, subsequent encounter: Secondary | ICD-10-CM | POA: Diagnosis not present

## 2014-07-13 DIAGNOSIS — H3531 Nonexudative age-related macular degeneration: Secondary | ICD-10-CM | POA: Diagnosis not present

## 2014-07-20 DIAGNOSIS — J209 Acute bronchitis, unspecified: Secondary | ICD-10-CM | POA: Diagnosis not present

## 2014-07-20 DIAGNOSIS — R05 Cough: Secondary | ICD-10-CM | POA: Diagnosis not present

## 2014-07-20 DIAGNOSIS — E1129 Type 2 diabetes mellitus with other diabetic kidney complication: Secondary | ICD-10-CM | POA: Diagnosis not present

## 2014-07-20 DIAGNOSIS — Z6827 Body mass index (BMI) 27.0-27.9, adult: Secondary | ICD-10-CM | POA: Diagnosis not present

## 2014-07-20 DIAGNOSIS — J0100 Acute maxillary sinusitis, unspecified: Secondary | ICD-10-CM | POA: Diagnosis not present

## 2014-08-29 ENCOUNTER — Encounter (HOSPITAL_COMMUNITY): Payer: Self-pay | Admitting: Emergency Medicine

## 2014-08-29 ENCOUNTER — Inpatient Hospital Stay (HOSPITAL_COMMUNITY)
Admission: EM | Admit: 2014-08-29 | Discharge: 2014-08-31 | DRG: 309 | Disposition: A | Discharge status: Home / Self Care | Payer: Medicare Other | Attending: Internal Medicine | Admitting: Internal Medicine

## 2014-08-29 ENCOUNTER — Emergency Department (HOSPITAL_COMMUNITY): Payer: Medicare Other

## 2014-08-29 DIAGNOSIS — H3530 Unspecified macular degeneration: Secondary | ICD-10-CM | POA: Diagnosis present

## 2014-08-29 DIAGNOSIS — Z79899 Other long term (current) drug therapy: Secondary | ICD-10-CM | POA: Diagnosis not present

## 2014-08-29 DIAGNOSIS — I25110 Atherosclerotic heart disease of native coronary artery with unstable angina pectoris: Secondary | ICD-10-CM | POA: Diagnosis present

## 2014-08-29 DIAGNOSIS — I4891 Unspecified atrial fibrillation: Secondary | ICD-10-CM

## 2014-08-29 DIAGNOSIS — R0602 Shortness of breath: Secondary | ICD-10-CM | POA: Diagnosis not present

## 2014-08-29 DIAGNOSIS — Z7901 Long term (current) use of anticoagulants: Secondary | ICD-10-CM | POA: Diagnosis not present

## 2014-08-29 DIAGNOSIS — R079 Chest pain, unspecified: Secondary | ICD-10-CM

## 2014-08-29 DIAGNOSIS — E785 Hyperlipidemia, unspecified: Secondary | ICD-10-CM | POA: Diagnosis present

## 2014-08-29 DIAGNOSIS — E780 Pure hypercholesterolemia: Secondary | ICD-10-CM | POA: Diagnosis present

## 2014-08-29 DIAGNOSIS — Z96641 Presence of right artificial hip joint: Secondary | ICD-10-CM | POA: Diagnosis present

## 2014-08-29 DIAGNOSIS — E1122 Type 2 diabetes mellitus with diabetic chronic kidney disease: Secondary | ICD-10-CM | POA: Diagnosis present

## 2014-08-29 DIAGNOSIS — Z794 Long term (current) use of insulin: Secondary | ICD-10-CM | POA: Diagnosis not present

## 2014-08-29 DIAGNOSIS — K219 Gastro-esophageal reflux disease without esophagitis: Secondary | ICD-10-CM | POA: Diagnosis present

## 2014-08-29 DIAGNOSIS — N183 Chronic kidney disease, stage 3 unspecified: Secondary | ICD-10-CM

## 2014-08-29 DIAGNOSIS — Z7982 Long term (current) use of aspirin: Secondary | ICD-10-CM

## 2014-08-29 DIAGNOSIS — I200 Unstable angina: Secondary | ICD-10-CM | POA: Diagnosis not present

## 2014-08-29 DIAGNOSIS — I480 Paroxysmal atrial fibrillation: Principal | ICD-10-CM

## 2014-08-29 DIAGNOSIS — M6281 Muscle weakness (generalized): Secondary | ICD-10-CM | POA: Diagnosis not present

## 2014-08-29 DIAGNOSIS — I248 Other forms of acute ischemic heart disease: Secondary | ICD-10-CM | POA: Diagnosis present

## 2014-08-29 DIAGNOSIS — I10 Essential (primary) hypertension: Secondary | ICD-10-CM | POA: Diagnosis not present

## 2014-08-29 DIAGNOSIS — R0789 Other chest pain: Secondary | ICD-10-CM | POA: Diagnosis not present

## 2014-08-29 DIAGNOSIS — I129 Hypertensive chronic kidney disease with stage 1 through stage 4 chronic kidney disease, or unspecified chronic kidney disease: Secondary | ICD-10-CM | POA: Diagnosis present

## 2014-08-29 HISTORY — DX: Chronic kidney disease, stage 3 unspecified: N18.30

## 2014-08-29 HISTORY — DX: Hyperlipidemia, unspecified: E78.5

## 2014-08-29 HISTORY — DX: Transient global amnesia: G45.4

## 2014-08-29 HISTORY — DX: Paroxysmal atrial fibrillation: I48.0

## 2014-08-29 HISTORY — DX: Personal history of urinary calculi: Z87.442

## 2014-08-29 HISTORY — DX: Type 2 diabetes mellitus with other diabetic kidney complication: E11.29

## 2014-08-29 HISTORY — DX: Chronic kidney disease, stage 3 (moderate): N18.3

## 2014-08-29 HISTORY — DX: Atherosclerotic heart disease of native coronary artery without angina pectoris: I25.10

## 2014-08-29 LAB — CBC
HCT: 36.9 % — ABNORMAL LOW (ref 39.0–52.0)
HEMOGLOBIN: 11.8 g/dL — AB (ref 13.0–17.0)
MCH: 29 pg (ref 26.0–34.0)
MCHC: 32 g/dL (ref 30.0–36.0)
MCV: 90.7 fL (ref 78.0–100.0)
PLATELETS: 170 10*3/uL (ref 150–400)
RBC: 4.07 MIL/uL — ABNORMAL LOW (ref 4.22–5.81)
RDW: 14.5 % (ref 11.5–15.5)
WBC: 9.7 10*3/uL (ref 4.0–10.5)

## 2014-08-29 LAB — COMPREHENSIVE METABOLIC PANEL
ALK PHOS: 65 U/L (ref 39–117)
ALT: 22 U/L (ref 0–53)
AST: 25 U/L (ref 0–37)
Albumin: 2.9 g/dL — ABNORMAL LOW (ref 3.5–5.2)
Anion gap: 4 — ABNORMAL LOW (ref 5–15)
BILIRUBIN TOTAL: 0.7 mg/dL (ref 0.3–1.2)
BUN: 25 mg/dL — ABNORMAL HIGH (ref 6–23)
CALCIUM: 8.5 mg/dL (ref 8.4–10.5)
CHLORIDE: 105 mmol/L (ref 96–112)
CO2: 26 mmol/L (ref 19–32)
Creatinine, Ser: 1.97 mg/dL — ABNORMAL HIGH (ref 0.50–1.35)
GFR calc Af Amer: 33 mL/min — ABNORMAL LOW (ref >90)
GFR calc non Af Amer: 29 mL/min — ABNORMAL LOW (ref >90)
Glucose, Bld: 322 mg/dL — ABNORMAL HIGH (ref 70–99)
Potassium: 4.3 mmol/L (ref 3.5–5.1)
Sodium: 135 mmol/L (ref 135–145)
TOTAL PROTEIN: 5.5 g/dL — AB (ref 6.0–8.3)

## 2014-08-29 LAB — URINE MICROSCOPIC-ADD ON

## 2014-08-29 LAB — I-STAT TROPONIN, ED: Troponin i, poc: 0.09 ng/mL (ref 0.00–0.08)

## 2014-08-29 LAB — URINALYSIS, ROUTINE W REFLEX MICROSCOPIC
Bilirubin Urine: NEGATIVE
Glucose, UA: 500 mg/dL — AB
Hgb urine dipstick: NEGATIVE
Ketones, ur: 15 mg/dL — AB
Nitrite: NEGATIVE
PROTEIN: NEGATIVE mg/dL
SPECIFIC GRAVITY, URINE: 1.025 (ref 1.005–1.030)
Urobilinogen, UA: 0.2 mg/dL (ref 0.0–1.0)
pH: 5.5 (ref 5.0–8.0)

## 2014-08-29 LAB — TSH
TSH: 1.552 u[IU]/mL (ref 0.350–4.500)
TSH: 1.686 u[IU]/mL (ref 0.350–4.500)

## 2014-08-29 LAB — GLUCOSE, CAPILLARY: GLUCOSE-CAPILLARY: 201 mg/dL — AB (ref 70–99)

## 2014-08-29 LAB — TROPONIN I: Troponin I: 0.25 ng/mL — ABNORMAL HIGH (ref <0.031)

## 2014-08-29 LAB — I-STAT CG4 LACTIC ACID, ED
LACTIC ACID, VENOUS: 1.65 mmol/L (ref 0.5–2.0)
Lactic Acid, Venous: 0.85 mmol/L (ref 0.5–2.0)

## 2014-08-29 MED ORDER — ONDANSETRON HCL 4 MG/2ML IJ SOLN: 4.0000 mg | Freq: Four times a day (QID) | INTRAMUSCULAR | Status: DC | PRN

## 2014-08-29 MED ORDER — ASPIRIN EC 81 MG PO TBEC
81.0000 mg | DELAYED_RELEASE_TABLET | Freq: Every day | ORAL | Status: DC
  Administered 2014-08-30 – 2014-08-31 (×2): 81 mg via ORAL
  Filled 2014-08-29 (×2): qty 1

## 2014-08-29 MED ORDER — HEPARIN (PORCINE) IN NACL 100-0.45 UNIT/ML-% IJ SOLN
1000.0000 [IU]/h | INTRAMUSCULAR | Status: DC
  Administered 2014-08-29: 1100 [IU]/h via INTRAVENOUS
  Administered 2014-08-30: 1000 [IU]/h via INTRAVENOUS
  Filled 2014-08-29 (×4): qty 250

## 2014-08-29 MED ORDER — NITROGLYCERIN 0.4 MG SL SUBL: 0.4000 mg | SUBLINGUAL_TABLET | SUBLINGUAL | Status: DC | PRN

## 2014-08-29 MED ORDER — HEPARIN BOLUS VIA INFUSION
4000.0000 [IU] | Freq: Once | INTRAVENOUS | Status: AC
  Administered 2014-08-29: 4000 [IU] via INTRAVENOUS
  Filled 2014-08-29: qty 4000

## 2014-08-29 MED ORDER — ACETAMINOPHEN 325 MG PO TABS
650.0000 mg | ORAL_TABLET | ORAL | Status: DC | PRN
Start: 2014-08-29 — End: 2014-08-31

## 2014-08-29 MED ORDER — METOPROLOL TARTRATE 25 MG PO TABS
25.0000 mg | ORAL_TABLET | Freq: Two times a day (BID) | ORAL | Status: DC
  Administered 2014-08-29 – 2014-08-31 (×4): 25 mg via ORAL
  Filled 2014-08-29 (×5): qty 1

## 2014-08-29 MED ORDER — ASPIRIN 81 MG PO CHEW: 324.0000 mg | CHEWABLE_TABLET | Freq: Once | ORAL | Status: DC

## 2014-08-29 NOTE — ED Notes (Signed)
NOTIFIED DR. Doy Mince IN PERSON FOR PATIENTS LAB RESULTS OF I-STAT TROPONIN ,$RemoveBeforeDE'@17'YNhmpyeIXkDiDxN$ :45PM, 08/29/2014,

## 2014-08-29 NOTE — ED Notes (Signed)
Per EMS, pt had chest pain earlier today but denied pain with EMS. Pt was in a-fib with RVR rate of 160's, EMS administered $RemoveBeforeDE'10mg'foiXIsjjsxNtaBx$  cardizem and initiated a drip at $Remov'5mg'MfvbIM$ /hr and pt converted to rate of 80's. Pt was hypotensive initially but resolved with 500 bolus.

## 2014-08-29 NOTE — ED Provider Notes (Signed)
I saw and evaluated the patient, reviewed the resident's note and I agree with the findings and plan.   EKG Interpretation   Date/Time:  $RemoveBef'Sunday August 29 2014 16:53:03 EST Ventricular Rate:  82 PR Interval:  187 QRS Duration: 144 QT Interval:  399 QTC Calculation: 466 R Axis:   -72 Text Interpretation:  Sinus rhythm RBBB and LAFB Left ventricular  hypertrophy Anterior Q waves, possibly due to LVH No significant change  was found Confirmed by Jayceion Lisenby  MD, TREY ([id number]) on 08/29/2014 6:55:14 PM      89'[id number]$  year old male presenting with fatigue and chest pain on exertion found to be in A. fib with RVR by EMS. They gave diltiazem bolus and drip, which converted him to sinus rhythm. His symptoms subsequently resolved.  On exam, well appearing, nontoxic, not distressed, normal respiratory effort, normal perfusion, heart sounds normal with regular rate and rhythm, I/VI systolic murmur, lungs clear to auscultation bilaterally.  He was admitted by cardiology.   Clinical Impression: 1. Atrial fibrillation with RVR   2. Chest pain, unspecified chest pain type       Houston Siren III, MD 08/30/14 0005

## 2014-08-29 NOTE — Progress Notes (Signed)
Received pt via stretcher to room 238 for diagnosis of chest pain and rapid Afib.  Pt appears comfortable and in no acute distress at this time, will continue to monitor pt

## 2014-08-29 NOTE — Progress Notes (Signed)
ANTICOAGULATION CONSULT NOTE - Initial Consult  Pharmacy Consult for Heparin Indication: CP and Afib with RVR  No Known Allergies  Patient Measurements: Height: 6\' 2"  (188 cm) Weight: 205 lb (92.987 kg) IBW/kg (Calculated) : 82.2 Heparin Dosing Weight: 93 kg  Vital Signs: Temp: 97.6 F (36.4 C) (02/28 1815) Temp Source: Oral (02/28 1815) BP: 120/69 mmHg (02/28 1935) Pulse Rate: 72 (02/28 1935)  Labs:  Recent Labs  08/29/14 1711  HGB 11.8*  HCT 36.9*  PLT 170  CREATININE 1.97*    Estimated Creatinine Clearance: 30.7 mL/min (by C-G formula based on Cr of 1.97).   Medical History: Past Medical History  Diagnosis Date  . Hypertension   . Hypercholesterolemia   . GERD (gastroesophageal reflux disease)   . Transient global amnesia 2008   . Type 2 diabetes mellitus with renal manifestations, controlled   . Paroxysmal atrial fibrillation 08/29/2014    Chads2VASC score 5   . Kidney disease, chronic, stage III (GFR 30-59 ml/min) 08/29/2014  . Hyperlipidemia 12/02/2008  . History of kidney stones   . CAD (coronary artery disease), native coronary artery     Catheterization 2008 showing moderate nonobstructive disease in LAD, circumflex and right coronary artery, no lesions greater than 40%.  EF of 35% at that time.     Assessment: 56 YOM who presented to the [facility] with Afib that converted en route via EMS. The patient also had chest pain and a bump in troponins and pharmacy has been consulted to start Heparin for anticoagulation while awaiting further cardiology work-up. Hep Wt: 93 kg, baseline Hgb/Hct okay, plts 170.   The patient was not on any anticoagulants PTA except for ASA 162 mg daily. No recent surgeries or history of CVA are noted.   Goal of Therapy:  Heparin level 0.3-0.7 units/ml Monitor platelets by anticoagulation protocol: Yes   Plan:  1. Heparin bolus of 4000 units x 1  2. Initiate heparin at a drip rate of 1100 units/hr (11 ml/hr) 3. Daily heparin  level, CBC 4. Will continue to monitor for any signs/symptoms of bleeding and will follow up with heparin level in 8 hours   Alycia Rossetti, PharmD, BCPS Clinical Pharmacist Pager: [phone] 08/29/2014 8:08 PM

## 2014-08-29 NOTE — ED Provider Notes (Signed)
CSN: [id number]     Arrival date & time 08/29/14  1635 History   First MD Initiated Contact with Patient 08/29/14 1647     Chief Complaint  Patient presents with  . Atrial Fibrillation     (Consider location/radiation/quality/duration/timing/severity/associated sxs/prior Treatment) The history is provided by the patient.     81 year old male with past medical history of hypertension, hyperlipidemia, type 2 diabetes, coronary artery disease who presents with a one-day history of generalized fatigue and chest pain. The patient states he has been recovering from a right lower extremity injury and states that he was in bed all day yesterday due to pain from the extremity. Upon awakening today, he felt generally fatigued. When he got out of bed and tried to walk to the other room, he experienced acute onset of dull aching substernal chest pressure with associated severe shortness of breath. The symptoms resolved upon resting, but his generalized sense of weakness returned. He attempted to ambulate multiple times again with recurrence of his chest pain with any exertion. He also endorses associated diaphoresis during times of severe pain. He subsequently called EMS. Per EMS, the patient was in atrial fibrillation with rapid ventricular response on arrival. He was given diltiazem and started on a diltiazem drip with conversion to sinus rhythm. The patient states that a following administration of diltiazem and return of his heart rate to a normal rate, his symptoms have resolved and he feels significantly better. Denies any chest pain currently.  Past Medical History  Diagnosis Date  . Hypertension   . Diabetes mellitus   . Hypercholesterolemia   . GERD (gastroesophageal reflux disease)   . Ureteral stone 03/09/2013  . MI (myocardial infarction)     denies any chest pain at present   Past Surgical History  Procedure Laterality Date  . Hip surgery    . Tonsillectomy    . Cystoscopy w/ ureteral  stent placement Left 03/06/2013    Procedure: CYSTOSCOPY WITH RETROGRADE PYELOGRAM/URETERAL STENT PLACEMENT;  Surgeon: Malka So, MD;  Location: [facility];  Service: Urology;  Laterality: Left;  . Joint replacement     History reviewed. No pertinent family history. History  Substance Use Topics  . Smoking status: Never Smoker   . Smokeless tobacco: Not on file  . Alcohol Use: Yes     Comment: occasionally    Review of Systems  Constitutional: Positive for fatigue. Negative for fever and chills.  HENT: Negative for congestion and sore throat.   Eyes: Negative for visual disturbance.  Respiratory: Positive for chest tightness (During episode of chest pain, now resolved) and shortness of breath (During episode of chest pain, now resolved). Negative for cough and wheezing.   Cardiovascular: Positive for chest pain. Negative for palpitations and leg swelling.  Gastrointestinal: Negative for nausea, abdominal pain, diarrhea and rectal pain.  Genitourinary: Negative for flank pain.  Musculoskeletal: Negative for neck pain.  Skin: Negative for rash.  Neurological: Positive for weakness (Generalized). Negative for syncope, facial asymmetry and headaches.      Allergies  Review of patient's allergies indicates no known allergies.  Home Medications   Prior to Admission medications   Medication Sig Start Date End Date Taking? Authorizing Provider  aspirin EC 81 MG tablet Take 162 mg by mouth daily.   Yes Historical Provider, MD  atorvastatin (LIPITOR) 40 MG tablet Take 40 mg by mouth daily.   Yes Historical Provider, MD  Calcium Carbonate-Vitamin D (CALCIUM-VITAMIN D) 500-200 MG-UNIT per tablet Take 1 tablet by  mouth daily.   Yes Historical Provider, MD  cycloSPORINE (RESTASIS) 0.05 % ophthalmic emulsion Place 2 drops into both eyes 3 (three) times daily.    Yes Historical Provider, MD  fish oil-omega-3 fatty acids 1000 MG capsule Take 1 capsule (1 g total) by mouth daily. Stop taking if  having diarrhea 02/05/12  Yes Ripudeep Krystal Eaton, MD  HYDROcodone-acetaminophen (NORCO) 10-325 MG per tablet Take 1-2 tablets by mouth every 4 (four) hours as needed for pain. 03/16/13   Claybon Jabs, MD  insulin aspart (NOVOLOG) 100 UNIT/ML injection Inject 0-15 Units into the skin 3 (three) times daily with meals. Sliding scale, 70-120-0 units, 121-150 2 units, 151-200 3 units, 201-250 3 units 251-300 8 units 301-350 11 units, 351-400 15 units, beyond call dr   Yes Historical Provider, MD  insulin detemir (LEVEMIR) 100 UNIT/ML injection Inject 31 Units into the skin at bedtime.    Yes Historical Provider, MD  isosorbide mononitrate (IMDUR) 30 MG 24 hr tablet Take 30 mg by mouth daily.   Yes Historical Provider, MD  linagliptin (TRADJENTA) 5 MG TABS tablet Take 1 tablet (5 mg total) by mouth daily. 02/05/12  Yes Ripudeep Krystal Eaton, MD  Multiple Vitamins-Minerals (MULTIVITAMIN WITH MINERALS) tablet Take 2 tablets by mouth daily.    Yes Historical Provider, MD  Multiple Vitamins-Minerals (PRESERVISION AREDS 2) CAPS Take 1 capsule by mouth 2 (two) times daily.    Yes Historical Provider, MD  omeprazole (PRILOSEC) 20 MG capsule Take 20 mg by mouth daily.    Yes Historical Provider, MD  polyethylene glycol (MIRALAX / GLYCOLAX) packet Take 17 g by mouth 2 (two) times daily as needed (for constipation).     Historical Provider, MD  Pyridoxine HCl (VITAMIN B-6) 500 MG tablet Take 500 mg by mouth daily.   Yes Historical Provider, MD  tamsulosin (FLOMAX) 0.4 MG CAPS capsule Take 1 capsule (0.4 mg total) by mouth daily after supper. Patient not taking: Reported on 08/29/2014 03/16/13   Claybon Jabs, MD  timolol (BETIMOL) 0.5 % ophthalmic solution Place 1 drop into the left eye 2 (two) times daily.    Yes Historical Provider, MD   BP 114/63 mmHg  Pulse 78  Temp(Src) 97.6 F (36.4 C) (Oral)  Resp 20  SpO2 96% Physical Exam  Constitutional: He is oriented to person, place, and time. He appears well-developed and  well-nourished. No distress.  HENT:  Head: Normocephalic and atraumatic.  Mouth/Throat: No oropharyngeal exudate.  Eyes: Conjunctivae are normal. Pupils are equal, round, and reactive to light.  Neck: Normal range of motion. Neck supple.  Cardiovascular: Normal rate, normal heart sounds and intact distal pulses.  Exam reveals no friction rub.   No murmur heard. Pulmonary/Chest: Effort normal and breath sounds normal. No respiratory distress. He has no wheezes. He has no rales.  Abdominal: Soft. He exhibits no distension. There is no tenderness.  Musculoskeletal: He exhibits no edema.  Neurological: He is alert and oriented to person, place, and time.  Skin: Skin is warm. No rash noted.  Nursing note and vitals reviewed.   ED Course  Procedures (including critical care time) Labs Review Labs Reviewed  CBC - Abnormal; Notable for the following:    RBC 4.07 (*)    Hemoglobin 11.8 (*)    HCT 36.9 (*)    All other components within normal limits  COMPREHENSIVE METABOLIC PANEL - Abnormal; Notable for the following:    Glucose, Bld 322 (*)    BUN 25 (*)  Creatinine, Ser 1.97 (*)    Total Protein 5.5 (*)    Albumin 2.9 (*)    GFR calc non Af Amer 29 (*)    GFR calc Af Amer 33 (*)    Anion gap 4 (*)    All other components within normal limits  URINALYSIS, ROUTINE W REFLEX MICROSCOPIC - Abnormal; Notable for the following:    APPearance CLOUDY (*)    Glucose, UA 500 (*)    Ketones, ur 15 (*)    Leukocytes, UA MODERATE (*)    All other components within normal limits  URINE MICROSCOPIC-ADD ON - Abnormal; Notable for the following:    Squamous Epithelial / LPF FEW (*)    Bacteria, UA FEW (*)    Casts HYALINE CASTS (*)    All other components within normal limits  TROPONIN I - Abnormal; Notable for the following:    Troponin I 0.25 (*)    All other components within normal limits  TROPONIN I - Abnormal; Notable for the following:    Troponin I 0.29 (*)    All other components  within normal limits  HEPARIN LEVEL (UNFRACTIONATED) - Abnormal; Notable for the following:    Heparin Unfractionated 0.91 (*)    All other components within normal limits  LIPID PANEL - Abnormal; Notable for the following:    HDL 39 (*)    All other components within normal limits  GLUCOSE, CAPILLARY - Abnormal; Notable for the following:    Glucose-Capillary 201 (*)    All other components within normal limits  I-STAT TROPOININ, ED - Abnormal; Notable for the following:    Troponin i, poc 0.09 (*)    All other components within normal limits  TSH  TSH  TROPONIN I  HEPARIN LEVEL (UNFRACTIONATED)  I-STAT CG4 LACTIC ACID, ED  I-STAT CG4 LACTIC ACID, ED    Imaging Review Dg Chest 2 View  08/29/2014   CLINICAL DATA:  Shortness of breath and weakness  EXAM: CHEST  2 VIEW  COMPARISON:  03/05/2013  FINDINGS: Cardiac shadow is stable. The lungs are well aerated bilaterally. Very minimal atelectatic changes are noted in the right upper lobe. No sizable effusion is seen. No acute bony abnormality is noted.  IMPRESSION: Minimal right upper lobe atelectatic changes.   Electronically Signed   By: Inez Catalina M.D.   On: 08/29/2014 18:25     EKG Interpretation   Date/Time:  Sunday August 29 2014 16:53:03 EST Ventricular Rate:  82 PR Interval:  187 QRS Duration: 144 QT Interval:  399 QTC Calculation: 466 R Axis:   -72 Text Interpretation:  Sinus rhythm RBBB and LAFB Left ventricular  hypertrophy Anterior Q waves, possibly due to LVH No significant change  was found Confirmed by [facility]  MD, TREY ([id number]) on 08/29/2014 6:55:14 PM      MDM   81 year old male with past medical history of hypertension, hyperlipidemia, type 2 diabetes, coronary artery disease who presents with a one-day history of generalized fatigue and chest pain. Found to be in AFib by EMS, now resolved. On arrival, T 20F, HR 78, RR 21, BP 105/53, satting 93% on RA. Exam as above, pt overall well-appearing and in NAD.  Heart RRR, with symmetric pulses bilaterally.  Pt's presentation is most c/w new-onset, paroxysmal AFib with subsequent angina 2/2 demand ischemia. No apparent trigger for AFib on exam - will send broad infectious and metabolic work-up for eval of underlying etiology. Pt CP free at this time and EKG shows no  acute ST-T segment changes, no evidence of acute ischemia or infarct. Will plan to c/s Cardiology given age, risk factors, and Chads2VASC score of 5.  Labs reviewed as above. CBC with mild anemia of 11.8, no leukocytosis. CMP with likely CKD with BUN 25, Cr 1.97. TSH normal. UA with + WBCs, few bacteria, but pt has no urinary sx. Troponin 0.09, likely demand. Lactate WNL. CXR clear. Pt remains in NSR. Cardiology consulted, will admit for anticoagulation and management/work-up of new-onset AFib. Pt in agreement with this plan.  Clinical Impression: 1. Atrial fibrillation with RVR   2. Chest pain, unspecified chest pain type   3. Chest pain     Disposition: Admit  Condition: Stable  Pt seen in conjunction with Dr. Cleone Slim, MD 08/30/14 Conroy III, MD 08/30/14 [phone]

## 2014-08-29 NOTE — H&P (Signed)
History and Physical   Admit date: 08/29/2014 Name:  Victor Wallace Medical record number: [id number] DOB/Age:  79-Oct-1926  81 y.o. male  Referring Physician:  [facility]  Primary Cardiologist: Dr. Haroldine Laws   Primary Physician:  Dr. Osborne Casco  Chief complaint/reason for admission: Chest pain and fatigue   HPI:  This very nice 81 year old male is admitted with worsening chest discomfort as well as atrial fibrillation.  The patient was seen in 2008 at which point in time he had transient global amnesia and also some elevation of troponin.  At the time he had a catheterization showing mild 3 vessel disease and an EF of 35%.  When followed up in the outpatient clinic he later had normalization of his LV function.  He has diabetes mellitus and also stage III chronic kidney disease hypertension and hyperlipidemia.  He normally doesn't have any angina.  He is limited as to what he is able to do because of significant right hip pain following 2 surgical procedures on his right hip.  He had twisted his hip and has been spending the last 2 days in the bed and had not had a good appetite.  He got up this morning to go to the bathroom and he noted midsternal chest pain and severe fatigue that would occur any time he got up toward did activity today.  The symptoms persisted would always be relieved with rest.  The overwhelming fatigue and chest discomfort with exertion was to the point where EMS was called and he was brought in here.  On the way and reported that he had atrial fibrillation with rapid response and he was started on intravenous diltiazem and converted to sinus rhythm.  He is currently asymptomatic but has not been walking.  He normally does not have exertional angina.  He denies PND, orthopnea or edema.   Past Medical History  Diagnosis Date  . Hypertension   . Hypercholesterolemia   . GERD (gastroesophageal reflux disease)   . Transient global amnesia 2008   . Type 2  diabetes mellitus with renal manifestations, controlled   . Paroxysmal atrial fibrillation 08/29/2014    Chads2VASC score 5   . Kidney disease, chronic, stage III (GFR 30-59 ml/min) 08/29/2014  . Hyperlipidemia 12/02/2008  . History of kidney stones   . CAD (coronary artery disease), native coronary artery     Catheterization 2008 showing moderate nonobstructive disease in LAD, circumflex and right coronary artery, no lesions greater than 40%.  EF of 35% at that time.      Past Surgical History  Procedure Laterality Date  . Total hip arthroplasty Right   . Tonsillectomy    . Cystoscopy w/ ureteral stent placement Left 03/06/2013    Procedure: CYSTOSCOPY WITH RETROGRADE PYELOGRAM/URETERAL STENT PLACEMENT;  Surgeon: Malka So, MD;  Location: [facility];  Service: Urology;  Laterality: Left;   Allergies: has No Known Allergies.   Medications: Prior to Admission medications   Medication Sig Start Date End Date Taking? Authorizing Provider  aspirin EC 81 MG tablet Take 162 mg by mouth daily.   Yes Historical Provider, MD  atorvastatin (LIPITOR) 40 MG tablet Take 40 mg by mouth daily.   Yes Historical Provider, MD  Calcium Carbonate-Vitamin D (CALCIUM-VITAMIN D) 500-200 MG-UNIT per tablet Take 1 tablet by mouth daily.   Yes Historical Provider, MD  cycloSPORINE (RESTASIS) 0.05 % ophthalmic emulsion Place 2 drops into both eyes 3 (three) times daily.    Yes Historical Provider, MD  fish oil-omega-3 fatty acids 1000 MG capsule Take 1 capsule (1 g total) by mouth daily. Stop taking if having diarrhea 02/05/12  Yes Ripudeep Krystal Eaton, MD  HYDROcodone-acetaminophen (NORCO) 10-325 MG per tablet Take 1-2 tablets by mouth every 4 (four) hours as needed for pain. 03/16/13   Claybon Jabs, MD  insulin aspart (NOVOLOG) 100 UNIT/ML injection Inject 0-15 Units into the skin 3 (three) times daily with meals. Sliding scale, 70-120-0 units, 121-150 2 units, 151-200 3 units, 201-250 3 units 251-300 8 units 301-350 11 units,  351-400 15 units, beyond call dr   Yes Historical Provider, MD  insulin detemir (LEVEMIR) 100 UNIT/ML injection Inject 31 Units into the skin at bedtime.    Yes Historical Provider, MD  isosorbide mononitrate (IMDUR) 30 MG 24 hr tablet Take 30 mg by mouth daily.   Yes Historical Provider, MD  linagliptin (TRADJENTA) 5 MG TABS tablet Take 1 tablet (5 mg total) by mouth daily. 02/05/12  Yes Ripudeep Krystal Eaton, MD  Multiple Vitamins-Minerals (MULTIVITAMIN WITH MINERALS) tablet Take 2 tablets by mouth daily.    Yes Historical Provider, MD  Multiple Vitamins-Minerals (PRESERVISION AREDS 2) CAPS Take 1 capsule by mouth 2 (two) times daily.    Yes Historical Provider, MD  omeprazole (PRILOSEC) 20 MG capsule Take 20 mg by mouth daily.    Yes Historical Provider, MD  polyethylene glycol (MIRALAX / GLYCOLAX) packet Take 17 g by mouth 2 (two) times daily as needed (for constipation).     Historical Provider, MD  Pyridoxine HCl (VITAMIN B-6) 500 MG tablet Take 500 mg by mouth daily.   Yes Historical Provider, MD  tamsulosin (FLOMAX) 0.4 MG CAPS capsule Take 1 capsule (0.4 mg total) by mouth daily after supper. Patient not taking: Reported on 08/29/2014 03/16/13   Claybon Jabs, MD  timolol (BETIMOL) 0.5 % ophthalmic solution Place 1 drop into the left eye 2 (two) times daily.    Yes Historical Provider, MD   Family History:  Family Status  Relation Status Death Age  . Father Deceased 31    `MI  . Mother Deceased 77    Sepsis in setting of small bowel obstruction  . Brother Deceased 64s    Unknown cause   Social History:   reports that he has never smoked. He does not have any smokeless tobacco history on file. He reports that he drinks alcohol. He reports that he does not use illicit drugs.   History   Social History Narrative   He is a retired Water quality scientist at the [employer]     Review of Systems: He has macular degeneration and reduced vision.  He has had previous cataract  extraction bilaterally.  He normally has no dyspnea.  He has some mild indigestion and relates it to GERD.  He has significant nocturia.  Significant pain involving his right femur and right hip from his previous surgical procedures.  No prior TIA or stroke and no current neurologic symptoms.  Other than as noted above the  remainder of the review of systems is unremarkable.   Physical Exam: BP 114/63 mmHg  Pulse 78  Temp(Src) 97.6 F (36.4 C) (Oral)  Resp 20  SpO2 96% General appearance: Pleasant elderly male in no acute distress currently.  Younger than stated age Head: Normocephalic, without obvious abnormality, atraumatic Eyes: conjunctivae/corneas clear. PERRL, EOM's intact. Fundi benign. Neck: no adenopathy, no carotid bruit, no JVD and supple, symmetrical, trachea midline Lungs: clear to auscultation bilaterally Heart:  Regular rhythm, normal S1 and S2, no S3, 1 to 2/6 systolic murmur at left sternal border which is holosystolic Abdomen: soft, non-tender; bowel sounds normal; no masses,  no organomegaly Rectal: deferred Extremities: extremities normal, atraumatic, no cyanosis or edema Pulses: 2+ and symmetric Skin: Skin color, texture, turgor normal. No rashes or lesions Neurologic: Grossly normal   Labs: CBC  Recent Labs  08/29/14 1711  WBC 9.7  RBC 4.07*  HGB 11.8*  HCT 36.9*  PLT 170  MCV 90.7  MCH 29.0  MCHC 32.0  RDW 14.5   CMP   Recent Labs  08/29/14 1711  NA 135  K 4.3  CL 105  CO2 26  GLUCOSE 322*  BUN 25*  CREATININE 1.97*  CALCIUM 8.5  PROT 5.5*  ALBUMIN 2.9*  AST 25  ALT 22  ALKPHOS 65  BILITOT 0.7  GFRNONAA 29*  GFRAA 33*   Cardiac Panel (last 3 results) Troponin (Point of Care Test)  Recent Labs  08/29/14 1734  TROPIPOC 0.09*   Thyroid  Lab Results  Component Value Date   TSH 1.552 08/29/2014    EKG: Sinus rhythm, right bundle branch block, left anterior hemiblock, Q waves present in V2 with possible old anteroseptal  infarction  Radiology: Normal right lobe atelectasis, otherwise unremarkable   IMPRESSIONS: 1.  Chest discomfort suggestive of unstable angina today 2.  Reported history of atrial fibrillation resolved within intravenous diltiazem in the ambulance 3.  Insulin-dependent diabetes mellitus with renal disease 4.  Stage III chronic kidney disease 5.  Hypertension 6.  History of transient global amnesia 7.  Hyperlipidemia 8.  History of kidney stones  PLAN: Place on telemetry.  I will start him on intravenous heparin tonight and cycle troponins.  His symptoms may have been due to atrial fibrillation.  If his troponins are significantly elevated may need to consider catheterization in light of his progressive symptoms.  He will be started on beta blockers and on the other hand if he doesn't have significant symptoms it may be best just to do stress testing in light of his renal insufficiency.  His CHAds2VASC score is 5 so in light of the atrial fibrillation he likely will need to be anticoagulated following his initial workup.  Keep nothing by mouth after midnight for cardiac workup.  Signed: Kerry Hough MD [facility] Cardiology  08/29/2014, 7:31 PM

## 2014-08-30 ENCOUNTER — Inpatient Hospital Stay (HOSPITAL_COMMUNITY): Payer: Medicare Other

## 2014-08-30 DIAGNOSIS — R079 Chest pain, unspecified: Secondary | ICD-10-CM

## 2014-08-30 DIAGNOSIS — I480 Paroxysmal atrial fibrillation: Principal | ICD-10-CM

## 2014-08-30 DIAGNOSIS — I10 Essential (primary) hypertension: Secondary | ICD-10-CM

## 2014-08-30 DIAGNOSIS — I200 Unstable angina: Secondary | ICD-10-CM

## 2014-08-30 LAB — GLUCOSE, CAPILLARY
GLUCOSE-CAPILLARY: 160 mg/dL — AB (ref 70–99)
GLUCOSE-CAPILLARY: 107 mg/dL — AB (ref 70–99)
Glucose-Capillary: 154 mg/dL — ABNORMAL HIGH (ref 70–99)
Glucose-Capillary: 267 mg/dL — ABNORMAL HIGH (ref 70–99)

## 2014-08-30 LAB — LIPID PANEL
CHOLESTEROL: 105 mg/dL (ref 0–200)
HDL: 39 mg/dL — ABNORMAL LOW (ref >39)
LDL Cholesterol: 56 mg/dL (ref 0–99)
Total CHOL/HDL Ratio: 2.7 RATIO
Triglycerides: 49 mg/dL (ref <150)
VLDL: 10 mg/dL (ref 0–40)

## 2014-08-30 LAB — TROPONIN I
TROPONIN I: 0.29 ng/mL — AB (ref <0.031)
TROPONIN I: 0.29 ng/mL — AB (ref <0.031)

## 2014-08-30 LAB — HEPARIN LEVEL (UNFRACTIONATED)
HEPARIN UNFRACTIONATED: 0.74 [IU]/mL — AB (ref 0.30–0.70)
Heparin Unfractionated: 0.91 IU/mL — ABNORMAL HIGH (ref 0.30–0.70)
Heparin Unfractionated: 0.48 IU/mL (ref 0.30–0.70)

## 2014-08-30 MED ORDER — INSULIN ASPART 100 UNIT/ML ~~LOC~~ SOLN
0.0000 [IU] | Freq: Three times a day (TID) | SUBCUTANEOUS | Status: DC
Start: 2014-08-30 — End: 2014-08-31
  Administered 2014-08-30: 3 [IU] via SUBCUTANEOUS
  Administered 2014-08-30: 8 [IU] via SUBCUTANEOUS

## 2014-08-30 MED ORDER — INSULIN DETEMIR 100 UNIT/ML ~~LOC~~ SOLN
31.0000 [IU] | Freq: Every day | SUBCUTANEOUS | Status: DC
  Administered 2014-08-30: 31 [IU] via SUBCUTANEOUS
  Filled 2014-08-30 (×2): qty 0.31

## 2014-08-30 MED ORDER — INSULIN ASPART 100 UNIT/ML ~~LOC~~ SOLN: 0.0000 [IU] | Freq: Three times a day (TID) | SUBCUTANEOUS | Status: DC

## 2014-08-30 MED ORDER — TECHNETIUM TC 99M SESTAMIBI GENERIC - CARDIOLITE
10.0000 | Freq: Once | INTRAVENOUS | Status: AC | PRN
  Administered 2014-08-30: 10 via INTRAVENOUS

## 2014-08-30 MED ORDER — REGADENOSON 0.4 MG/5ML IV SOLN
INTRAVENOUS | Status: AC
  Administered 2014-08-30: 0.4 mg via INTRAVENOUS
  Filled 2014-08-30: qty 5

## 2014-08-30 MED ORDER — TECHNETIUM TC 99M SESTAMIBI - CARDIOLITE
30.0000 | Freq: Once | INTRAVENOUS | Status: AC | PRN
  Administered 2014-08-30: 30 via INTRAVENOUS

## 2014-08-30 MED ORDER — ISOSORBIDE MONONITRATE ER 30 MG PO TB24
30.0000 mg | ORAL_TABLET | Freq: Every day | ORAL | Status: DC
  Administered 2014-08-30 – 2014-08-31 (×2): 30 mg via ORAL
  Filled 2014-08-30 (×2): qty 1

## 2014-08-30 MED ORDER — LINAGLIPTIN 5 MG PO TABS
5.0000 mg | ORAL_TABLET | Freq: Every day | ORAL | Status: DC
  Administered 2014-08-30 – 2014-08-31 (×2): 5 mg via ORAL
  Filled 2014-08-30 (×2): qty 1

## 2014-08-30 MED ORDER — ATORVASTATIN CALCIUM 40 MG PO TABS
40.0000 mg | ORAL_TABLET | Freq: Every day | ORAL | Status: DC
  Administered 2014-08-30 – 2014-08-31 (×2): 40 mg via ORAL
  Filled 2014-08-30 (×2): qty 1

## 2014-08-30 MED ORDER — PANTOPRAZOLE SODIUM 40 MG PO TBEC
40.0000 mg | DELAYED_RELEASE_TABLET | Freq: Every day | ORAL | Status: DC
Start: 2014-08-30 — End: 2014-08-31
  Administered 2014-08-30 – 2014-08-31 (×2): 40 mg via ORAL
  Filled 2014-08-30 (×2): qty 1

## 2014-08-30 MED ORDER — OMEGA-3-ACID ETHYL ESTERS 1 G PO CAPS
1.0000 g | ORAL_CAPSULE | Freq: Every day | ORAL | Status: DC
  Administered 2014-08-30 – 2014-08-31 (×2): 1 g via ORAL
  Filled 2014-08-30 (×2): qty 1

## 2014-08-30 MED ORDER — REGADENOSON 0.4 MG/5ML IV SOLN
0.4000 mg | Freq: Once | INTRAVENOUS | Status: AC
  Administered 2014-08-30: 0.4 mg via INTRAVENOUS
  Filled 2014-08-30: qty 5

## 2014-08-30 MED ORDER — CYCLOSPORINE 0.05 % OP EMUL
2.0000 [drp] | Freq: Three times a day (TID) | OPHTHALMIC | Status: DC
  Administered 2014-08-30 – 2014-08-31 (×4): 2 [drp] via OPHTHALMIC
  Filled 2014-08-30 (×6): qty 1

## 2014-08-30 MED ORDER — TIMOLOL MALEATE 0.5 % OP SOLN
1.0000 [drp] | Freq: Two times a day (BID) | OPHTHALMIC | Status: DC
  Administered 2014-08-30 – 2014-08-31 (×4): 1 [drp] via OPHTHALMIC
  Filled 2014-08-30: qty 5

## 2014-08-30 MED ORDER — HYDROCODONE-ACETAMINOPHEN 10-325 MG PO TABS: 1.0000 | ORAL_TABLET | ORAL | Status: DC | PRN

## 2014-08-30 MED ORDER — OCUVITE-LUTEIN PO CAPS
1.0000 | ORAL_CAPSULE | Freq: Two times a day (BID) | ORAL | Status: DC
Start: 2014-08-30 — End: 2014-08-31
  Administered 2014-08-30 – 2014-08-31 (×4): 1 via ORAL
  Filled 2014-08-30 (×5): qty 1

## 2014-08-30 NOTE — Progress Notes (Signed)
SUBJECTIVE:  Denies any further CP  OBJECTIVE:   Vitals:   Filed Vitals:   08/29/14 2000 08/29/14 2008 08/29/14 2046 08/30/14 0358  BP: 131/67 131/67 115/75 122/65  Pulse: 71 95 67 67  Temp:   97.7 F (36.5 C) 98 F (36.7 C)  TempSrc:   Oral Oral  Resp: $Remo'19  18 18  'DWldw$ Height:   '6\' 2"'$  (1.88 m)   Weight:   205 lb 9.6 oz (93.26 kg)   SpO2: 97%  99% 95%   I&O's:  No intake or output data in the 24 hours ending 08/30/14 0801 TELEMETRY: Reviewed telemetry pt in NSR:     PHYSICAL EXAM General: Well developed, well nourished, in no acute distress Head: Eyes PERRLA, No xanthomas.   Normal cephalic and atramatic  Lungs:   Clear bilaterally to auscultation and percussion. Heart:   HRRR S1 S2 Pulses are 2+ & equal. Abdomen: Bowel sounds are positive, abdomen soft and non-tender without masses  Extremities:   No clubbing, cyanosis or edema.  DP +1 Neuro: Alert and oriented X 3. Psych:  Good affect, responds appropriately   LABS: Basic Metabolic Panel:  Recent Labs  08/29/14 1711  NA 135  K 4.3  CL 105  CO2 26  GLUCOSE 322*  BUN 25*  CREATININE 1.97*  CALCIUM 8.5   Liver Function Tests:  Recent Labs  08/29/14 1711  AST 25  ALT 22  ALKPHOS 65  BILITOT 0.7  PROT 5.5*  ALBUMIN 2.9*   No results for input(s): LIPASE, AMYLASE in the last 72 hours. CBC:  Recent Labs  08/29/14 1711  WBC 9.7  HGB 11.8*  HCT 36.9*  MCV 90.7  PLT 170   Cardiac Enzymes:  Recent Labs  08/29/14 1945 08/30/14 0052  TROPONINI 0.25* 0.29*   BNP: Invalid input(s): POCBNP D-Dimer: No results for input(s): DDIMER in the last 72 hours. Hemoglobin A1C: No results for input(s): HGBA1C in the last 72 hours. Fasting Lipid Panel:  Recent Labs  08/30/14 0052  CHOL 105  HDL 39*  LDLCALC 56  TRIG 49  CHOLHDL 2.7   Thyroid Function Tests:  Recent Labs  08/29/14 1945  TSH 1.686   Anemia Panel: No results for input(s): VITAMINB12, FOLATE, FERRITIN, TIBC, IRON, RETICCTPCT  in the last 72 hours. Coag Panel:   Lab Results  Component Value Date   INR 1.12 01/31/2012   INR 0.9 03/19/2007    RADIOLOGY: Dg Chest 2 View  08/29/2014   CLINICAL DATA:  Shortness of breath and weakness  EXAM: CHEST  2 VIEW  COMPARISON:  03/05/2013  FINDINGS: Cardiac shadow is stable. The lungs are well aerated bilaterally. Very minimal atelectatic changes are noted in the right upper lobe. No sizable effusion is seen. No acute bony abnormality is noted.  IMPRESSION: Minimal right upper lobe atelectatic changes.   Electronically Signed   By: Inez Catalina M.D.   On: 08/29/2014 18:25    IMPRESSIONS/PLAN: 1. Chest discomfort suggestive of unstable angina - his troponin is slightly elevated but flat with no significant trend.  This is most likely secondary to demand ischemia in the setting of rapid atrial fibrillation.  He has not had any further CP since admission.  Given his underlying CKD would not pursue cath at this time.  Will plan Lexiscan myoview to rule out ischemia.  Would cath only if a large area of ischemia.  Continue ASA/IV Heparin/nitrates and BB 2. Reported history of atrial fibrillation resolved within intravenous diltiazem in  the ambulance.  Continues to maintain NSR.  His CHADS2VASC score is 5 so will transition to Eliquis once ischemic workup complete.  Continue BB 3. Insulin-dependent diabetes mellitus with renal disease 4. Stage III chronic kidney disease 5. Hypertension - controlled.  Continue BB 6. History of transient global amnesia 7. Hyperlipidemia - LDL at goal at 56 8. History of kidney stones   Sueanne Margarita, MD  08/30/2014  8:01 AM

## 2014-08-30 NOTE — Progress Notes (Signed)
Utilization Review Completed.Donne Anon T2/29/2016

## 2014-08-30 NOTE — Progress Notes (Signed)
lexiscan myoview completed without complications.  Nuc results to follow.

## 2014-08-30 NOTE — Progress Notes (Signed)
ANTICOAGULATION CONSULT NOTE - Follow Up Consult  Pharmacy Consult for heparin Indication: chest pain/ACS and afib RVR  No Known Allergies  Patient Measurements: Height: 6\' 2"  (188 cm) Weight: 205 lb 9.6 oz (93.26 kg) IBW/kg (Calculated) : 82.2  Vital Signs: BP: 110/63 mmHg (02/29 1500) Pulse Rate: 68 (02/29 1500)  Labs:  Recent Labs  08/29/14 1711 08/29/14 1945 08/30/14 0052 08/30/14 0707 08/30/14 1909  HGB 11.8*  --   --   --   --   HCT 36.9*  --   --   --   --   PLT 170  --   --   --   --   HEPARINUNFRC  --   --  0.91* 0.74* 0.48  CREATININE 1.97*  --   --   --   --   TROPONINI  --  0.25* 0.29* 0.29*  --     Estimated Creatinine Clearance: 30.7 mL/min (by C-G formula based on Cr of 1.97).   Medications:  Heparin @ 1000 units/hr  Assessment: 89yom continues on heparin for chest pain and afib. He underwent a lexiscan myoview which was low risk. Heparin level is therapeutic after rate decrease this morning.. Planning to switch to eliquis tomorrow for his afib.   Goal of Therapy:  Heparin level 0.3-0.7 units/ml Monitor platelets by anticoagulation protocol: Yes   Plan:  1) Continue heparin at 1000 units/hr for now 2) Follow up heparin level and CBC in AM 3) Follow up transition to eliquis 3/1  Deboraha Sprang 08/30/2014,8:13 PM

## 2014-08-30 NOTE — Progress Notes (Signed)
ANTICOAGULATION CONSULT NOTE - Initial Consult  Pharmacy Consult for Heparin Indication: CP and Afib with RVR  No Known Allergies  Patient Measurements: Height: 6\' 2"  (188 cm) Weight: 205 lb 9.6 oz (93.26 kg) IBW/kg (Calculated) : 82.2 Heparin Dosing Weight: 93 kg  Vital Signs: Temp: 98 F (36.7 C) (02/29 0358) Temp Source: Oral (02/29 0358) BP: 122/65 mmHg (02/29 0358) Pulse Rate: 67 (02/29 0358)  Labs:  Recent Labs  08/29/14 1711 08/29/14 1945 08/30/14 0052 08/30/14 0707  HGB 11.8*  --   --   --   HCT 36.9*  --   --   --   PLT 170  --   --   --   HEPARINUNFRC  --   --  0.91* 0.74*  CREATININE 1.97*  --   --   --   TROPONINI  --  0.25* 0.29* 0.29*    Estimated Creatinine Clearance: 30.7 mL/min (by C-G formula based on Cr of 1.97).   Medical History: Past Medical History  Diagnosis Date  . Hypertension   . Hypercholesterolemia   . GERD (gastroesophageal reflux disease)   . Transient global amnesia 2008   . Type 2 diabetes mellitus with renal manifestations, controlled   . Paroxysmal atrial fibrillation 08/29/2014    Chads2VASC score 5   . Kidney disease, chronic, stage III (GFR 30-59 ml/min) 08/29/2014  . Hyperlipidemia 12/02/2008  . History of kidney stones   . CAD (coronary artery disease), native coronary artery     Catheterization 2008 showing moderate nonobstructive disease in LAD, circumflex and right coronary artery, no lesions greater than 40%.  EF of 35% at that time.     Assessment: 67 YOM who presented to the [facility] with Afib that converted en route via EMS. The patient also had chest pain and a bump in troponins and pharmacy has been consulted to start Heparin for anticoagulation while awaiting further cardiology work-up. Hep Wt: 93 kg, baseline Hgb/Hct okay, plts 170.   The patient was not on any anticoagulants PTA except for ASA 162 mg daily. No recent surgeries or history of CVA are noted.   HL remains SUPRAtherapeutic HL at 0.74 on heparin 1100  units/hr. No bleeding is noted.  Goal of Therapy:  Heparin level 0.3-0.7 units/ml Monitor platelets by anticoagulation protocol: Yes   Plan:  Decrease heparin to 1000 units/hr 8h HL Daily heparin level, CBC Monitor s/sx of bleeding  Andrey Cota. Diona Foley, PharmD Clinical Pharmacist Pager [phone]  08/30/2014 9:02 AM

## 2014-08-30 NOTE — Progress Notes (Signed)
Pt notified of Lexiscan myoview results.  We are planning for echo in AM and will change to eliquis from heparin tomorrow.

## 2014-08-30 NOTE — Progress Notes (Signed)
Patient has rested quietly throughout night and slept well.  Heparin drip continues at 1ml/hour per orders.  Will continue to monitor patient and assist as needed.

## 2014-08-31 DIAGNOSIS — I4891 Unspecified atrial fibrillation: Secondary | ICD-10-CM

## 2014-08-31 LAB — GLUCOSE, CAPILLARY: Glucose-Capillary: 71 mg/dL (ref 70–99)

## 2014-08-31 LAB — HEPARIN LEVEL (UNFRACTIONATED): Heparin Unfractionated: 0.61 IU/mL (ref 0.30–0.70)

## 2014-08-31 LAB — HEMOGLOBIN A1C
Hgb A1c MFr Bld: 7.6 % — ABNORMAL HIGH (ref 4.8–5.6)
MEAN PLASMA GLUCOSE: 171 mg/dL

## 2014-08-31 MED ORDER — ASPIRIN EC 81 MG PO TBEC: 81.0000 mg | DELAYED_RELEASE_TABLET | Freq: Every day | ORAL | Status: DC

## 2014-08-31 MED ORDER — APIXABAN 2.5 MG PO TABS
2.5000 mg | ORAL_TABLET | Freq: Two times a day (BID) | ORAL | Status: DC
  Administered 2014-08-31: 2.5 mg via ORAL
  Filled 2014-08-31 (×2): qty 1

## 2014-08-31 MED ORDER — METOPROLOL TARTRATE 25 MG PO TABS: 25.0000 mg | ORAL_TABLET | Freq: Two times a day (BID) | ORAL | Status: DC

## 2014-08-31 MED ORDER — APIXABAN 2.5 MG PO TABS: 2.5000 mg | ORAL_TABLET | Freq: Two times a day (BID) | ORAL | Status: DC

## 2014-08-31 MED ORDER — NITROGLYCERIN 0.4 MG SL SUBL: 0.4000 mg | SUBLINGUAL_TABLET | SUBLINGUAL | Status: DC | PRN

## 2014-08-31 NOTE — Progress Notes (Signed)
SUBJECTIVE:  No complaints  OBJECTIVE:   Vitals:   Filed Vitals:   08/30/14 1203 08/30/14 1500 08/30/14 2052 08/31/14 0355  BP: 133/84 110/63 131/76 143/79  Pulse: 82 68 67 69  Temp:   97.5 F (36.4 C) 97.6 F (36.4 C)  TempSrc:   Oral Oral  Resp:  $Remo'18 18 19  'gNaBl$ Height:      Weight:      SpO2:  98% 100% 97%   I&O's:   Intake/Output Summary (Last 24 hours) at 08/31/14 6503 Last data filed at 08/31/14 0845  Gross per 24 hour  Intake    840 ml  Output      0 ml  Net    840 ml   TELEMETRY: Reviewed telemetry pt in NSR     PHYSICAL EXAM General: Well developed, well nourished, in no acute distress Head: Eyes PERRLA, No xanthomas.   Normal cephalic and atramatic  Lungs:   Clear bilaterally to auscultation and percussion. Heart:   HRRR S1 S2 Pulses are 2+ & equal. Abdomen: Bowel sounds are positive, abdomen soft and non-tender without masses  Extremities:   No clubbing, cyanosis or edema.  DP +1 Neuro: Alert and oriented X 3. Psych:  Good affect, responds appropriately   LABS: Basic Metabolic Panel:  Recent Labs  08/29/14 1711  NA 135  K 4.3  CL 105  CO2 26  GLUCOSE 322*  BUN 25*  CREATININE 1.97*  CALCIUM 8.5   Liver Function Tests:  Recent Labs  08/29/14 1711  AST 25  ALT 22  ALKPHOS 65  BILITOT 0.7  PROT 5.5*  ALBUMIN 2.9*   No results for input(s): LIPASE, AMYLASE in the last 72 hours. CBC:  Recent Labs  08/29/14 1711  WBC 9.7  HGB 11.8*  HCT 36.9*  MCV 90.7  PLT 170   Cardiac Enzymes:  Recent Labs  08/29/14 1945 08/30/14 0052 08/30/14 0707  TROPONINI 0.25* 0.29* 0.29*   BNP: Invalid input(s): POCBNP D-Dimer: No results for input(s): DDIMER in the last 72 hours. Hemoglobin A1C:  Recent Labs  08/30/14 0707  HGBA1C 7.6*   Fasting Lipid Panel:  Recent Labs  08/30/14 0052  CHOL 105  HDL 39*  LDLCALC 56  TRIG 49  CHOLHDL 2.7   Thyroid Function Tests:  Recent Labs  08/29/14 1945  TSH 1.686   Anemia Panel: No  results for input(s): VITAMINB12, FOLATE, FERRITIN, TIBC, IRON, RETICCTPCT in the last 72 hours. Coag Panel:   Lab Results  Component Value Date   INR 1.12 01/31/2012   INR 0.9 03/19/2007    RADIOLOGY: Dg Chest 2 View  08/29/2014   CLINICAL DATA:  Shortness of breath and weakness  EXAM: CHEST  2 VIEW  COMPARISON:  03/05/2013  FINDINGS: Cardiac shadow is stable. The lungs are well aerated bilaterally. Very minimal atelectatic changes are noted in the right upper lobe. No sizable effusion is seen. No acute bony abnormality is noted.  IMPRESSION: Minimal right upper lobe atelectatic changes.   Electronically Signed   By: Inez Catalina M.D.   On: 08/29/2014 18:25   Nm Myocar Multi W/spect W/wall Motion / Ef  08/30/2014   CLINICAL DATA:  Chest pain  EXAM: MYOCARDIAL IMAGING WITH SPECT (REST AND PHARMACOLOGIC-STRESS)  GATED LEFT VENTRICULAR WALL MOTION STUDY  LEFT VENTRICULAR EJECTION FRACTION  TECHNIQUE: Standard myocardial SPECT imaging was performed after resting intravenous injection of 10 mCi Tc-77m sestamibi. Subsequently, intravenous infusion of Lexiscan was performed under the supervision of the Cardiology  staff. At peak effect of the drug, 30 mCi Tc-34m sestamibi was injected intravenously and standard myocardial SPECT imaging was performed. Quantitative gated imaging was also performed to evaluate left ventricular wall motion, and estimate left ventricular ejection fraction.  COMPARISON:  None.  FINDINGS: Perfusion: There is fixed thinning of the inferolateral wall towards the base. No stress-induced perfusion defect.  Wall Motion: Normal left ventricular wall motion. No left ventricular dilation.  Left Ventricular Ejection Fraction: 64 %  End diastolic volume 83 ml  End systolic volume 30 ml  IMPRESSION: 1. Fixed thinning of the inferolateral wall is either due to diaphragmatic attenuation artifact or possibly and infarct. No stress-induced ischemia.  2. Normal left ventricular wall motion.  3. Left  ventricular ejection fraction 64%  4. Low-risk stress test findings*.  *2012 Appropriate Use Criteria for Coronary Revascularization Focused Update: J Am Coll Cardiol. 8756;43(3):295-188. http://content.airportbarriers.com.aspx?articleid=1201161   Electronically Signed   By: Marybelle Killings M.D.   On: 08/30/2014 12:52   IMPRESSIONS/PLAN: 1. Chest discomfort suggestive of unstable angina - his troponin is slightly elevated but flat with no significant trend. This is most likely secondary to demand ischemia in the setting of rapid atrial fibrillation. He has not had any further CP since admission. Given his underlying CKD would not pursue cath at this time.Lexiscan myoview showed fixed thinning of the basal inferolateral wall most likely due to diaphragmatic attenuation with no ischemia and normal LVF. Continue ASA/nitrates and BB.  Stop IV Heparin gtt.  2D echo pending and if ok then stable for discharge home.    2. Reported history of atrial fibrillation resolved with intravenous diltiazem in the ambulance. Continues to maintain NSR. His CHADS2VASC score is 5 so will transition to Eliquis 2.$RemoveBefo'5mg'dmpzSzvnLZf$  BID today.  Continue BB  3. Insulin-dependent diabetes mellitus with renal disease 4. Stage III chronic kidney disease - stable 5. Hypertension - controlled. Continue BB 6. History of transient global amnesia 7. Hyperlipidemia - LDL at goal at 56 8. History of kidney stones    Sueanne Margarita, MD  08/31/2014  9:28 AM

## 2014-08-31 NOTE — Progress Notes (Signed)
ANTICOAGULATION CONSULT NOTE - Follow Up Consult  Pharmacy Consult for heparin transition to apixaban Indication: chest pain/ACS and afib RVR  No Known Allergies  Patient Measurements: Height: 6\' 2"  (188 cm) Weight: 205 lb 9.6 oz (93.26 kg) IBW/kg (Calculated) : 82.2  Vital Signs: Temp: 97.6 F (36.4 C) (03/01 0355) Temp Source: Oral (03/01 0355) BP: 143/79 mmHg (03/01 0355) Pulse Rate: 69 (03/01 0355)  Labs:  Recent Labs  08/29/14 1711 08/29/14 1945  08/30/14 0052 08/30/14 0707 08/30/14 1909 08/31/14 0505  HGB 11.8*  --   --   --   --   --   --   HCT 36.9*  --   --   --   --   --   --   PLT 170  --   --   --   --   --   --   HEPARINUNFRC  --   --   < > 0.91* 0.74* 0.48 0.61  CREATININE 1.97*  --   --   --   --   --   --   TROPONINI  --  0.25*  --  0.29* 0.29*  --   --   < > = values in this interval not displayed.  Estimated Creatinine Clearance: 30.7 mL/min (by C-G formula based on Cr of 1.97).   Medications:  Heparin @ 1000 units/hr  Assessment: 89yom continues on heparin for chest pain and afib. He underwent a lexiscan myoview which was low risk. Heparin level is therapeutic after rate decrease this morning.. Planning to switch to eliquis tomorrow for his afib.   HL remained therapeutic this morning on heparin 1000 units/hr. Criteria for reducing apixaban dose: Age ?80 years, body weight ?60 kg, or serum creatinine ?1.5 mg/dL. Pt meets criteria with age and sCr.  Goal of Therapy:  Monitor platelets by anticoagulation protocol: Yes   Plan:  D/c heparin Start apixaban 2.5mg  PO BID Educate patient on apixaban Monitor s/sx of bleeding  Andrey Cota. Diona Foley, PharmD Clinical Pharmacist Pager [phone] 08/31/2014,9:38 AM

## 2014-08-31 NOTE — Progress Notes (Signed)
Patient discharged per orders. All d/c orders/instructions/medications/prescriptions/follow up care discussed as well as steps to take if chest pain occurs. We discussed signs and symptoms that might require a visit to his doctor or a phone call to 911. Time allowed for questions and concerns. Patient stated he had none. Patient will be transported home by his wife. Patient waiting for his wife to arrive. Will continue to monitor until then. Roselyn Reef CovingtonRN

## 2014-08-31 NOTE — Discharge Summary (Signed)
Physician Discharge Summary  Patient ID: Victor Wallace MRN: [id number] DOB/AGE: 79-Jan-1926 79 y.o.   Primary Cardiologist: Dr. Haroldine Laws  Admit date: 08/29/2014 Discharge date: 08/31/2014  Admission Diagnoses: Chest Pain and Fatigue  Discharge Diagnoses:  Active Problems:   Paroxysmal atrial fibrillation   Hypertension   Unstable angina pectoris   Discharged Condition: stable  Hospital Course: 81 y/o male with history of mild 3V CAD on cath in 2008 with EF of 35% at that time. Repeat echo in 2009 demonstrated normalization of EF back to 60-65%. He also has a h/o paroxsymal atrial fibrillation, diabetes, stage III CKD, HTN and HLD.   He presented to [facility] via EMS on 08/29/14 with complaints of chest pain and fatigue. En route to the ED, he apparently was noted to be in atrial fibrillation with RVR and was started on IV diltiazem by EMS. He spontaneously converted to NSR and was asymptomatic by the time he arrived. He was admitted to telemetry. He was placed on IV heparin. TSH was normal. Cardiac enzymes were cycled and were elevated x 3 at 0.25, 0.29 and 0.29. 2D echo showed normal LVF with EF of 60-65%, G1DD and no WMA. He underwent a NST that was negative for ischemia. It was felt that his troponin elevation was subsequent to demand ischemia in the setting of atrial fibrillation w/ RVR. On telemetry, he maintained NSR without recurrent atrial fibrillation. He was placed on metoprolol for rate control. Given an elevated CHA2DS2 VASc score of 5, he was transitioned from IV heparin to Eliquis, 2.5 mg BID. He was last seen and examined by Dr. Radford Pax, who determined he was stable for discharge home. Post hospital f/u has been arranged with Kerin Ransom, PA-C, on 09/21/14.    Consults: None  Significant Diagnostic Studies:   NST 08/30/14 IMPRESSION: 1. Fixed thinning of the inferolateral wall is either due to diaphragmatic attenuation artifact or possibly and infarct. No stress-induced  ischemia.  2. Normal left ventricular wall motion.  3. Left ventricular ejection fraction 64%  4. Low-risk stress test findings*.   2D echo 08/31/2014 Study Conclusions  - Left ventricle: The cavity size was normal. There was severe concentric hypertrophy. Systolic function was normal. The estimated ejection fraction was in the range of 60% to 65%. Wall motion was normal; there were no regional wall motion abnormalities. Doppler parameters are consistent with abnormal left ventricular relaxation (grade 1 diastolic dysfunction). The E/e&' ratio is between 8-15, suggesting indeterminate LV filling pressure. - Aortic valve: Trileaflet; mildly calcified leaflets. Transvalvular velocity was minimally increased. There was no stenosis. There was no regurgitation. - Mitral valve: Calcified annulus. There was trivial regurgitation. - Left atrium: The atrium was normal in size. - Tricuspid valve: There was trivial regurgitation. - Pulmonary arteries: PA peak pressure: 21 mm Hg (S). - Inferior vena cava: The vessel was normal in size. The respirophasic diameter changes were in the normal range (= 50%), consistent with normal central venous pressure.  Impressions:  - Compared to a prior echo in 2009, there is now severe LV wall thickening, especially the proximal septum. EF is 60-65%, normal wall motion, calcified aortic valve leaflet with borderline mild aortic stenosis.   Treatments: See Hospital Course  Discharge Exam: Blood pressure 143/79, pulse 69, temperature 97.6 F (36.4 C), temperature source Oral, resp. rate 19, height $RemoveBe'6\' 2"'nQtgPhfBJ$  (1.88 m), weight 205 lb 9.6 oz (93.26 kg), SpO2 97 %.   Disposition: 01-Home or Self Care      Discharge Instructions  Diet - low sodium heart healthy    Complete by:  As directed      Increase activity slowly    Complete by:  As directed             Medication List    TAKE these medications         apixaban 2.5 MG Tabs tablet  Commonly known as:  ELIQUIS  Take 1 tablet (2.5 mg total) by mouth 2 (two) times daily.     aspirin EC 81 MG tablet  Take 1 tablet (81 mg total) by mouth daily.     atorvastatin 40 MG tablet  Commonly known as:  LIPITOR  Take 40 mg by mouth daily.     calcium-vitamin D 500-200 MG-UNIT per tablet  Take 1 tablet by mouth daily.     cycloSPORINE 0.05 % ophthalmic emulsion  Commonly known as:  RESTASIS  Place 2 drops into both eyes 3 (three) times daily.     fish oil-omega-3 fatty acids 1000 MG capsule  Take 1 capsule (1 g total) by mouth daily. Stop taking if having diarrhea     HYDROcodone-acetaminophen 10-325 MG per tablet  Commonly known as:  NORCO  Take 1-2 tablets by mouth every 4 (four) hours as needed for pain.     insulin aspart 100 UNIT/ML injection  Commonly known as:  novoLOG  Inject 0-15 Units into the skin 3 (three) times daily with meals. Sliding scale, 70-120-0 units, 121-150 2 units, 151-200 3 units, 201-250 3 units 251-300 8 units 301-350 11 units, 351-400 15 units, beyond call dr     insulin detemir 100 UNIT/ML injection  Commonly known as:  LEVEMIR  Inject 31 Units into the skin at bedtime.     isosorbide mononitrate 30 MG 24 hr tablet  Commonly known as:  IMDUR  Take 30 mg by mouth daily.     linagliptin 5 MG Tabs tablet  Commonly known as:  TRADJENTA  Take 1 tablet (5 mg total) by mouth daily.     metoprolol tartrate 25 MG tablet  Commonly known as:  LOPRESSOR  Take 1 tablet (25 mg total) by mouth 2 (two) times daily.     multivitamin with minerals tablet  Take 2 tablets by mouth daily.     PRESERVISION AREDS 2 Caps  Take 1 capsule by mouth 2 (two) times daily.     nitroGLYCERIN 0.4 MG SL tablet  Commonly known as:  NITROSTAT  Place 1 tablet (0.4 mg total) under the tongue every 5 (five) minutes x 3 doses as needed for chest pain.     omeprazole 20 MG capsule  Commonly known as:  PRILOSEC  Take 20 mg by mouth  daily.     polyethylene glycol packet  Commonly known as:  MIRALAX / GLYCOLAX  Take 17 g by mouth 2 (two) times daily as needed (for constipation).     tamsulosin 0.4 MG Caps capsule  Commonly known as:  FLOMAX  Take 1 capsule (0.4 mg total) by mouth daily after supper.     timolol 0.5 % ophthalmic solution  Commonly known as:  BETIMOL  Place 1 drop into the left eye 2 (two) times daily.     vitamin B-6 500 MG tablet  Take 500 mg by mouth daily.       Follow-up Information    Follow up with Erlene Quan, PA-C On 09/21/2014.   Specialty:  Cardiology   Why:  10:30 am (cardiology follow-up)   Contact  information:   [address] [place] [address] [phone]       TIME SPENT ON DISCHARGE INCLUDING PHYSICIAN TIME: >30 MINUTES  Signed: Lyda Jester 08/31/2014, 1:49 PM

## 2014-08-31 NOTE — Progress Notes (Signed)
  Echocardiogram 2D Echocardiogram has been performed.  Bobbye Charleston 08/31/2014, 9:12 AM

## 2014-09-03 ENCOUNTER — Telehealth: Payer: Self-pay | Admitting: Cardiology

## 2014-09-03 NOTE — Telephone Encounter (Signed)
Mail order pharmacy called to get authorization for 90 day supply on pt's new prescriptions. This was provided, encounter closed.

## 2014-09-21 ENCOUNTER — Ambulatory Visit (INDEPENDENT_AMBULATORY_CARE_PROVIDER_SITE_OTHER): Payer: Medicare Other | Admitting: Cardiology

## 2014-09-21 ENCOUNTER — Encounter: Payer: Self-pay | Admitting: Cardiology

## 2014-09-21 VITALS — BP 128/84 | HR 73 | Ht 74.0 in | Wt 209.8 lb

## 2014-09-21 DIAGNOSIS — I2510 Atherosclerotic heart disease of native coronary artery without angina pectoris: Secondary | ICD-10-CM | POA: Diagnosis not present

## 2014-09-21 DIAGNOSIS — E1129 Type 2 diabetes mellitus with other diabetic kidney complication: Secondary | ICD-10-CM

## 2014-09-21 DIAGNOSIS — Z7901 Long term (current) use of anticoagulants: Secondary | ICD-10-CM | POA: Diagnosis not present

## 2014-09-21 DIAGNOSIS — I480 Paroxysmal atrial fibrillation: Secondary | ICD-10-CM

## 2014-09-21 DIAGNOSIS — E785 Hyperlipidemia, unspecified: Secondary | ICD-10-CM | POA: Diagnosis not present

## 2014-09-21 DIAGNOSIS — N058 Unspecified nephritic syndrome with other morphologic changes: Secondary | ICD-10-CM

## 2014-09-21 DIAGNOSIS — I10 Essential (primary) hypertension: Secondary | ICD-10-CM

## 2014-09-21 DIAGNOSIS — N183 Chronic kidney disease, stage 3 unspecified: Secondary | ICD-10-CM

## 2014-09-21 NOTE — Assessment & Plan Note (Signed)
HCVD- echo shows normal LVF but severe LVH and mild AS

## 2014-09-21 NOTE — Patient Instructions (Signed)
Your physician recommends that you continue on your current medications as directed. Please refer to the Current Medication list given to you today.    Your physician wants you to follow-up in:  6 months Dr Haroldine Laws You will receive a reminder letter in the mail two months in advance. If you don't receive a letter, please call our office to schedule the follow-up appointment.

## 2014-09-21 NOTE — Progress Notes (Signed)
09/21/2014 Victor Wallace   19-Jun-1925  [id number]  Primary Physician Haywood Pao, MD Primary Cardiologist: Dr Haroldine Laws  HPI:  81 year old male is admitted 08/29/14 with worsening chest discomfort and DOE in the setting of rapid atrial fibrillation. The patient was seen in 2008 at which point in time he had transient global amnesia and also some elevation of troponin. At that time he had a catheterization showing mild 3 vessel disease and an EF of 35%. When followed up in the outpatient clinic he later had normalization of his LV function. He has diabetes mellitus and also stage III chronic kidney disease, hypertension, and hyperlipidemia. He normally doesn't have any angina. He was admitted and converted spontaneously with rate control. He had mildly elelvated Troponin felt to be secondary tp demand ischemia. A Myoview done was low risk. Echo revealed normal LVF although he is noted to have severe LVH and mild AS. He now remembers he had recently been taking Mucin ex D for bronchitis. He is in the office today with his wife for follow up. He has felt well since discharge, no further palpitations or chest pain.   Current Outpatient Prescriptions  Medication Sig Dispense Refill  . apixaban (ELIQUIS) 2.5 MG TABS tablet Take 1 tablet (2.5 mg total) by mouth 2 (two) times daily. 60 tablet 11  . aspirin EC 81 MG tablet Take 1 tablet (81 mg total) by mouth daily.    Marland Kitchen atorvastatin (LIPITOR) 40 MG tablet Take 40 mg by mouth daily.    . Calcium Carbonate-Vitamin D (CALCIUM-VITAMIN D) 500-200 MG-UNIT per tablet Take 1 tablet by mouth daily.    . cycloSPORINE (RESTASIS) 0.05 % ophthalmic emulsion Place 2 drops into both eyes 3 (three) times daily.     . fish oil-omega-3 fatty acids 1000 MG capsule Take 1 capsule (1 g total) by mouth daily. Stop taking if having diarrhea    . HYDROcodone-acetaminophen (NORCO) 10-325 MG per tablet Take 1-2 tablets by mouth every 4 (four) hours as needed for  pain. 30 tablet 0  . insulin aspart (NOVOLOG) 100 UNIT/ML injection Inject 0-15 Units into the skin 3 (three) times daily with meals. Sliding scale, 70-120-0 units, 121-150 2 units, 151-200 3 units, 201-250 3 units 251-300 8 units 301-350 11 units, 351-400 15 units, beyond call dr    . insulin detemir (LEVEMIR) 100 UNIT/ML injection Inject 31 Units into the skin at bedtime.     . isosorbide mononitrate (IMDUR) 30 MG 24 hr tablet Take 30 mg by mouth daily.    Marland Kitchen linagliptin (TRADJENTA) 5 MG TABS tablet Take 1 tablet (5 mg total) by mouth daily. 30 tablet 3  . metoprolol tartrate (LOPRESSOR) 25 MG tablet Take 1 tablet (25 mg total) by mouth 2 (two) times daily. 60 tablet 5  . Multiple Vitamins-Minerals (MULTIVITAMIN WITH MINERALS) tablet Take 2 tablets by mouth daily.     . Multiple Vitamins-Minerals (PRESERVISION AREDS 2) CAPS Take 1 capsule by mouth 2 (two) times daily.     . nitroGLYCERIN (NITROSTAT) 0.4 MG SL tablet Place 1 tablet (0.4 mg total) under the tongue every 5 (five) minutes x 3 doses as needed for chest pain. 25 tablet 2  . omeprazole (PRILOSEC) 20 MG capsule Take 20 mg by mouth daily.     . polyethylene glycol (MIRALAX / GLYCOLAX) packet Take 17 g by mouth 2 (two) times daily as needed (for constipation).     . Pyridoxine HCl (VITAMIN B-6) 500 MG tablet Take 500 mg by  mouth daily.    . tamsulosin (FLOMAX) 0.4 MG CAPS capsule Take 1 capsule (0.4 mg total) by mouth daily after supper. 30 capsule 0  . timolol (BETIMOL) 0.5 % ophthalmic solution Place 1 drop into the left eye 2 (two) times daily.      No current facility-administered medications for this visit.    No Known Allergies  History   Social History  . Marital Status: Married    Spouse Name: N/A  . Number of Children: N/A  . Years of Education: N/A   Occupational History  . Not on file.   Social History Main Topics  . Smoking status: Never Smoker   . Smokeless tobacco: Not on file  . Alcohol Use: 0.6 oz/week    1  Cans of beer per week     Comment: occasionally  . Drug Use: No  . Sexual Activity: No   Other Topics Concern  . Not on file   Social History Narrative   He is a retired Water quality scientist at the [employer]     Review of Systems: General: negative for chills, fever, night sweats or weight changes.  Cardiovascular: negative for chest pain, dyspnea on exertion, edema, orthopnea, palpitations, paroxysmal nocturnal dyspnea or shortness of breath Dermatological: negative for rash Respiratory: negative for cough or wheezing Urologic: negative for hematuria Abdominal: negative for nausea, vomiting, diarrhea, bright red blood per rectum, melena, or hematemesis Neurologic: negative for visual changes, syncope, or dizziness All other systems reviewed and are otherwise negative except as noted above.    Blood pressure 128/84, pulse 73, height $RemoveBe'6\' 2"'jAFIoCJDN$  (1.88 m), weight 209 lb 12.8 oz (95.165 kg).  General appearance: alert, cooperative and no distress Neck: no carotid bruit and no JVD Lungs: clear to auscultation bilaterally Heart: regular rate and rhythm Extremities: no edema   ASSESSMENT AND PLAN:   Paroxysmal atrial fibrillation Recent admission with symptomatic rapid AF. He had been taking decongestants for bronchitis a few weeks prior.    Kidney disease, chronic, stage III (GFR 30-59 ml/min) SCr 1.9, Dr Mercy Moore follows   Chronic anticoagulation Eliquis added, CHADSVASC=5   CAD (coronary artery disease), native coronary artery Catheterization 2008 showing moderate nonobstructive disease in LAD, circumflex and right coronary artery, no lesions greater than 40%.    Type 2 diabetes mellitus with renal manifestations, controlled Followed by Dr Odette Fraction   Hypertension HCVD- echo shows normal LVF but severe LVH and mild AS   Hyperlipidemia On statin Rx    PLAN  He is in NSR on exam today. We discussed avoiding decongestants with "D" in them and  avoiding caffeine. He has an appointment with Dr Odette Fraction in 2-3 weeks. We can see him back in 6 months. He'll continue his current medications with the exception of Flomax- he says he has never taken this.  Tyniya Kuyper KPA-C 09/21/2014 11:17 AM

## 2014-09-21 NOTE — Assessment & Plan Note (Signed)
Recent admission with symptomatic rapid AF. He had been taking decongestants for bronchitis a few weeks prior.

## 2014-09-21 NOTE — Assessment & Plan Note (Signed)
Followed by Dr Odette Fraction

## 2014-09-21 NOTE — Assessment & Plan Note (Signed)
Eliquis added, CHADSVASC=5

## 2014-09-21 NOTE — Assessment & Plan Note (Signed)
Catheterization 2008 showing moderate nonobstructive disease in LAD, circumflex and right coronary artery, no lesions greater than 40%.

## 2014-09-21 NOTE — Assessment & Plan Note (Signed)
SCr 1.9, Dr Mercy Moore follows

## 2014-09-21 NOTE — Assessment & Plan Note (Signed)
On statin Rx 

## 2014-09-25 ENCOUNTER — Emergency Department (HOSPITAL_COMMUNITY): Payer: Medicare Other

## 2014-09-25 ENCOUNTER — Emergency Department (HOSPITAL_COMMUNITY)
Admission: EM | Admit: 2014-09-25 | Discharge: 2014-09-25 | Disposition: A | Discharge status: Home / Self Care | Payer: Medicare Other | Attending: Emergency Medicine | Admitting: Emergency Medicine

## 2014-09-25 ENCOUNTER — Encounter (HOSPITAL_COMMUNITY): Payer: Self-pay | Admitting: Emergency Medicine

## 2014-09-25 DIAGNOSIS — I129 Hypertensive chronic kidney disease with stage 1 through stage 4 chronic kidney disease, or unspecified chronic kidney disease: Secondary | ICD-10-CM | POA: Diagnosis not present

## 2014-09-25 DIAGNOSIS — R0789 Other chest pain: Secondary | ICD-10-CM | POA: Diagnosis not present

## 2014-09-25 DIAGNOSIS — Z87442 Personal history of urinary calculi: Secondary | ICD-10-CM | POA: Diagnosis not present

## 2014-09-25 DIAGNOSIS — E785 Hyperlipidemia, unspecified: Secondary | ICD-10-CM | POA: Diagnosis not present

## 2014-09-25 DIAGNOSIS — Z7982 Long term (current) use of aspirin: Secondary | ICD-10-CM | POA: Diagnosis not present

## 2014-09-25 DIAGNOSIS — R05 Cough: Secondary | ICD-10-CM | POA: Diagnosis not present

## 2014-09-25 DIAGNOSIS — N183 Chronic kidney disease, stage 3 (moderate): Secondary | ICD-10-CM | POA: Diagnosis not present

## 2014-09-25 DIAGNOSIS — R069 Unspecified abnormalities of breathing: Secondary | ICD-10-CM | POA: Diagnosis not present

## 2014-09-25 DIAGNOSIS — E1122 Type 2 diabetes mellitus with diabetic chronic kidney disease: Secondary | ICD-10-CM | POA: Insufficient documentation

## 2014-09-25 DIAGNOSIS — R0602 Shortness of breath: Secondary | ICD-10-CM | POA: Diagnosis not present

## 2014-09-25 DIAGNOSIS — Z79899 Other long term (current) drug therapy: Secondary | ICD-10-CM | POA: Diagnosis not present

## 2014-09-25 DIAGNOSIS — R110 Nausea: Secondary | ICD-10-CM | POA: Insufficient documentation

## 2014-09-25 DIAGNOSIS — E780 Pure hypercholesterolemia: Secondary | ICD-10-CM | POA: Insufficient documentation

## 2014-09-25 DIAGNOSIS — Z8669 Personal history of other diseases of the nervous system and sense organs: Secondary | ICD-10-CM | POA: Diagnosis not present

## 2014-09-25 DIAGNOSIS — K219 Gastro-esophageal reflux disease without esophagitis: Secondary | ICD-10-CM | POA: Diagnosis not present

## 2014-09-25 DIAGNOSIS — I2510 Atherosclerotic heart disease of native coronary artery without angina pectoris: Secondary | ICD-10-CM | POA: Diagnosis not present

## 2014-09-25 DIAGNOSIS — R079 Chest pain, unspecified: Secondary | ICD-10-CM | POA: Diagnosis not present

## 2014-09-25 DIAGNOSIS — Z794 Long term (current) use of insulin: Secondary | ICD-10-CM | POA: Insufficient documentation

## 2014-09-25 DIAGNOSIS — Z7901 Long term (current) use of anticoagulants: Secondary | ICD-10-CM | POA: Diagnosis not present

## 2014-09-25 LAB — CBC
HCT: 38.5 % — ABNORMAL LOW (ref 39.0–52.0)
HEMOGLOBIN: 12.1 g/dL — AB (ref 13.0–17.0)
MCH: 28.8 pg (ref 26.0–34.0)
MCHC: 31.4 g/dL (ref 30.0–36.0)
MCV: 91.7 fL (ref 78.0–100.0)
PLATELETS: 151 10*3/uL (ref 150–400)
RBC: 4.2 MIL/uL — ABNORMAL LOW (ref 4.22–5.81)
RDW: 14.8 % (ref 11.5–15.5)
WBC: 16.7 10*3/uL — AB (ref 4.0–10.5)

## 2014-09-25 LAB — BASIC METABOLIC PANEL
Anion gap: 8 (ref 5–15)
BUN: 21 mg/dL (ref 6–23)
CO2: 23 mmol/L (ref 19–32)
CREATININE: 1.93 mg/dL — AB (ref 0.50–1.35)
Calcium: 8.6 mg/dL (ref 8.4–10.5)
Chloride: 105 mmol/L (ref 96–112)
GFR calc Af Amer: 34 mL/min — ABNORMAL LOW (ref >90)
GFR calc non Af Amer: 29 mL/min — ABNORMAL LOW (ref >90)
Glucose, Bld: 182 mg/dL — ABNORMAL HIGH (ref 70–99)
POTASSIUM: 4.4 mmol/L (ref 3.5–5.1)
Sodium: 136 mmol/L (ref 135–145)

## 2014-09-25 LAB — I-STAT TROPONIN, ED
TROPONIN I, POC: 0.01 ng/mL (ref 0.00–0.08)
Troponin i, poc: 0.01 ng/mL (ref 0.00–0.08)

## 2014-09-25 NOTE — ED Provider Notes (Signed)
CSN: [id number]     Arrival date & time 09/25/14  1515 History   First MD Initiated Contact with Patient 09/25/14 1603     Chief Complaint  Patient presents with  . Chest Pain     (Consider location/radiation/quality/duration/timing/severity/associated sxs/prior Treatment) Patient is a 81 y.o. male presenting with chest pain.  Chest Pain Pain location:  L chest Pain quality: aching and dull   Pain radiates to:  Does not radiate Pain radiates to the back: no   Pain severity:  Moderate Onset quality:  Sudden Duration:  20 minutes Timing:  Constant Progression:  Waxing and waning Chronicity:  Recurrent Context comment:  Ho CAD Relieved by:  Aspirin and nitroglycerin Worsened by:  Nothing tried Ineffective treatments:  None tried Associated symptoms: cough, nausea and shortness of breath   Associated symptoms: no abdominal pain and not vomiting     Past Medical History  Diagnosis Date  . Hypertension   . Hypercholesterolemia   . GERD (gastroesophageal reflux disease)   . Transient global amnesia 2008   . Type 2 diabetes mellitus with renal manifestations, controlled   . Paroxysmal atrial fibrillation 08/29/2014    Chads2VASC score 5   . Kidney disease, chronic, stage III (GFR 30-59 ml/min) 08/29/2014  . Hyperlipidemia 12/02/2008  . History of kidney stones   . CAD (coronary artery disease), native coronary artery     Catheterization 2008 showing moderate nonobstructive disease in LAD, circumflex and right coronary artery, no lesions greater than 40%.  EF of 35% at that time.    Past Surgical History  Procedure Laterality Date  . Total hip arthroplasty Right   . Tonsillectomy    . Cystoscopy w/ ureteral stent placement Left 03/06/2013    Procedure: CYSTOSCOPY WITH RETROGRADE PYELOGRAM/URETERAL STENT PLACEMENT;  Surgeon: Malka So, MD;  Location: [facility];  Service: Urology;  Laterality: Left;  . Joint replacement     Family History  Problem Relation Age of Onset  . Heart  attack Father   . Emphysema Brother    History  Substance Use Topics  . Smoking status: Never Smoker   . Smokeless tobacco: Not on file  . Alcohol Use: 0.6 oz/week    1 Cans of beer per week     Comment: occasionally    Review of Systems  Respiratory: Positive for cough and shortness of breath.   Cardiovascular: Positive for chest pain.  Gastrointestinal: Positive for nausea. Negative for vomiting and abdominal pain.  All other systems reviewed and are negative.     Allergies  Review of patient's allergies indicates no known allergies.  Home Medications   Prior to Admission medications   Medication Sig Start Date End Date Taking? Authorizing Provider  apixaban (ELIQUIS) 2.5 MG TABS tablet Take 1 tablet (2.5 mg total) by mouth 2 (two) times daily. 08/31/14   Brittainy Erie Noe, PA-C  aspirin EC 81 MG tablet Take 1 tablet (81 mg total) by mouth daily. 08/31/14   Brittainy Erie Noe, PA-C  atorvastatin (LIPITOR) 40 MG tablet Take 40 mg by mouth daily.    Historical Provider, MD  Calcium Carbonate-Vitamin D (CALCIUM-VITAMIN D) 500-200 MG-UNIT per tablet Take 1 tablet by mouth daily.    Historical Provider, MD  cycloSPORINE (RESTASIS) 0.05 % ophthalmic emulsion Place 2 drops into both eyes 3 (three) times daily.     Historical Provider, MD  fish oil-omega-3 fatty acids 1000 MG capsule Take 1 capsule (1 g total) by mouth daily. Stop taking if having diarrhea  02/05/12   Ripudeep Krystal Eaton, MD  HYDROcodone-acetaminophen (NORCO) 10-325 MG per tablet Take 1-2 tablets by mouth every 4 (four) hours as needed for pain. 03/16/13   Kathie Rhodes, MD  insulin aspart (NOVOLOG) 100 UNIT/ML injection Inject 0-15 Units into the skin 3 (three) times daily with meals. Sliding scale, 70-120-0 units, 121-150 2 units, 151-200 3 units, 201-250 3 units 251-300 8 units 301-350 11 units, 351-400 15 units, beyond call dr    Historical Provider, MD  insulin detemir (LEVEMIR) 100 UNIT/ML injection Inject 31 Units into the  skin at bedtime.     Historical Provider, MD  isosorbide mononitrate (IMDUR) 30 MG 24 hr tablet Take 30 mg by mouth daily.    Historical Provider, MD  linagliptin (TRADJENTA) 5 MG TABS tablet Take 1 tablet (5 mg total) by mouth daily. 02/05/12   Ripudeep Krystal Eaton, MD  metoprolol tartrate (LOPRESSOR) 25 MG tablet Take 1 tablet (25 mg total) by mouth 2 (two) times daily. 08/31/14   Brittainy Erie Noe, PA-C  Multiple Vitamins-Minerals (MULTIVITAMIN WITH MINERALS) tablet Take 2 tablets by mouth daily.     Historical Provider, MD  Multiple Vitamins-Minerals (PRESERVISION AREDS 2) CAPS Take 1 capsule by mouth 2 (two) times daily.     Historical Provider, MD  nitroGLYCERIN (NITROSTAT) 0.4 MG SL tablet Place 1 tablet (0.4 mg total) under the tongue every 5 (five) minutes x 3 doses as needed for chest pain. 08/31/14   Brittainy Erie Noe, PA-C  omeprazole (PRILOSEC) 20 MG capsule Take 20 mg by mouth daily.     Historical Provider, MD  polyethylene glycol (MIRALAX / GLYCOLAX) packet Take 17 g by mouth 2 (two) times daily as needed (for constipation).     Historical Provider, MD  Pyridoxine HCl (VITAMIN B-6) 500 MG tablet Take 500 mg by mouth daily.    Historical Provider, MD  tamsulosin (FLOMAX) 0.4 MG CAPS capsule Take 1 capsule (0.4 mg total) by mouth daily after supper. 03/16/13   Kathie Rhodes, MD  timolol (BETIMOL) 0.5 % ophthalmic solution Place 1 drop into the left eye 2 (two) times daily.     Historical Provider, MD   BP 108/62 mmHg  Pulse 67  Temp(Src) 98.5 F (36.9 C) (Oral)  Resp 20  Ht $R'6\' 2"'Eh$  (1.88 m)  Wt 205 lb (92.987 kg)  BMI 26.31 kg/m2  SpO2 95% Physical Exam  Constitutional: He is oriented to person, place, and time. He appears well-developed and well-nourished.  HENT:  Head: Normocephalic and atraumatic.  Eyes: Conjunctivae and EOM are normal.  Neck: Normal range of motion. Neck supple.  Cardiovascular: Normal rate, regular rhythm and normal heart sounds.   Pulmonary/Chest: Effort normal  and breath sounds normal. No respiratory distress.  Abdominal: He exhibits no distension. There is no tenderness. There is no rebound and no guarding.  Musculoskeletal: Normal range of motion.  Neurological: He is alert and oriented to person, place, and time.  Skin: Skin is warm and dry.  Vitals reviewed.   ED Course  Procedures (including critical care time) Labs Review Labs Reviewed  CBC - Abnormal; Notable for the following:    WBC 16.7 (*)    RBC 4.20 (*)    Hemoglobin 12.1 (*)    HCT 38.5 (*)    All other components within normal limits  BASIC METABOLIC PANEL - Abnormal; Notable for the following:    Glucose, Bld 182 (*)    Creatinine, Ser 1.93 (*)    GFR calc non Af Wyvonnia Lora  29 (*)    GFR calc Af Amer 34 (*)    All other components within normal limits  I-STAT TROPOININ, ED  Randolm Idol, ED    Imaging Review Dg Chest 2 View  09/25/2014   CLINICAL DATA:  Chest pain today at 3:30.  EXAM: CHEST  2 VIEW  COMPARISON:  08/29/2014  FINDINGS: The cardiac silhouette, mediastinal and hilar contours are within normal limits and stable. There is mild tortuosity, ectasia and calcification of the thoracic aorta. The lungs demonstrate mild chronic bronchitic changes but no infiltrates, edema or effusions. The bony thorax is intact.  IMPRESSION: No acute cardiopulmonary findings.  Chronic lung changes.   Electronically Signed   By: Marijo Sanes M.D.   On: 09/25/2014 17:27     EKG Interpretation   Date/Time:  Saturday September 25 2014 15:18:05 EDT Ventricular Rate:  77 PR Interval:  188 QRS Duration: 146 QT Interval:  418 QTC Calculation: 473 R Axis:   -73 Text Interpretation:  Sinus rhythm RBBB and LAFB Artifact in lead(s) I II  aVR Probably no change Confirmed by ZACKOWSKI  MD, SCOTT ([id number]) on  09/25/2014 3:39:43 PM      MDM   Final diagnoses:  Chest pain, unspecified chest pain type    81 y.o. male with pertinent PMH of HTN, HLD, CAD presents with chest pain as above.   Atypical, sudden onset, brief in duration.  Physical exam benign on arrival, pt without symptoms.  Delta trop negative.  Unlikely ACS given nature of symptoms, but discussed admission with the patient and his family, with shared decision making agreed on close cardiology fu as outpt, given that pt has been pain free throughout his stay and no new symptoms in 6 hours.    I have reviewed all laboratory and imaging studies if ordered as above  1. Chest pain, unspecified chest pain type         Debby Freiberg, MD 09/26/14 1500

## 2014-09-25 NOTE — Discharge Instructions (Signed)

## 2014-09-25 NOTE — ED Notes (Signed)
md at bedside

## 2014-09-25 NOTE — ED Notes (Signed)
Pt here from home with c/o chest pain that started around 1330. Pt took 3 nitro and 650 ASA and CP stopped. Pt also reporting increased urination since last night. Pt in no pain att his time. NAD.

## 2014-10-15 DIAGNOSIS — Z6827 Body mass index (BMI) 27.0-27.9, adult: Secondary | ICD-10-CM | POA: Diagnosis not present

## 2014-10-15 DIAGNOSIS — E785 Hyperlipidemia, unspecified: Secondary | ICD-10-CM | POA: Diagnosis not present

## 2014-10-15 DIAGNOSIS — I480 Paroxysmal atrial fibrillation: Secondary | ICD-10-CM | POA: Diagnosis not present

## 2014-10-15 DIAGNOSIS — M1990 Unspecified osteoarthritis, unspecified site: Secondary | ICD-10-CM | POA: Diagnosis not present

## 2014-10-15 DIAGNOSIS — I209 Angina pectoris, unspecified: Secondary | ICD-10-CM | POA: Diagnosis not present

## 2014-10-15 DIAGNOSIS — I2510 Atherosclerotic heart disease of native coronary artery without angina pectoris: Secondary | ICD-10-CM | POA: Diagnosis not present

## 2014-10-15 DIAGNOSIS — E1151 Type 2 diabetes mellitus with diabetic peripheral angiopathy without gangrene: Secondary | ICD-10-CM | POA: Diagnosis not present

## 2014-10-15 DIAGNOSIS — N184 Chronic kidney disease, stage 4 (severe): Secondary | ICD-10-CM | POA: Diagnosis not present

## 2014-10-15 DIAGNOSIS — E1129 Type 2 diabetes mellitus with other diabetic kidney complication: Secondary | ICD-10-CM | POA: Diagnosis not present

## 2014-10-15 DIAGNOSIS — G629 Polyneuropathy, unspecified: Secondary | ICD-10-CM | POA: Diagnosis not present

## 2014-10-27 DIAGNOSIS — H04123 Dry eye syndrome of bilateral lacrimal glands: Secondary | ICD-10-CM | POA: Diagnosis not present

## 2014-10-27 DIAGNOSIS — H3531 Nonexudative age-related macular degeneration: Secondary | ICD-10-CM | POA: Diagnosis not present

## 2014-10-27 DIAGNOSIS — H40011 Open angle with borderline findings, low risk, right eye: Secondary | ICD-10-CM | POA: Diagnosis not present

## 2014-10-27 DIAGNOSIS — Z961 Presence of intraocular lens: Secondary | ICD-10-CM | POA: Diagnosis not present

## 2014-10-27 DIAGNOSIS — E119 Type 2 diabetes mellitus without complications: Secondary | ICD-10-CM | POA: Diagnosis not present

## 2014-10-27 DIAGNOSIS — H4011X2 Primary open-angle glaucoma, moderate stage: Secondary | ICD-10-CM | POA: Diagnosis not present

## 2014-11-02 DIAGNOSIS — L03818 Cellulitis of other sites: Secondary | ICD-10-CM | POA: Diagnosis not present

## 2014-12-27 ENCOUNTER — Other Ambulatory Visit: Payer: Self-pay

## 2015-01-26 DIAGNOSIS — Z6828 Body mass index (BMI) 28.0-28.9, adult: Secondary | ICD-10-CM | POA: Diagnosis not present

## 2015-01-26 DIAGNOSIS — E785 Hyperlipidemia, unspecified: Secondary | ICD-10-CM | POA: Diagnosis not present

## 2015-01-26 DIAGNOSIS — Z1389 Encounter for screening for other disorder: Secondary | ICD-10-CM | POA: Diagnosis not present

## 2015-01-26 DIAGNOSIS — N401 Enlarged prostate with lower urinary tract symptoms: Secondary | ICD-10-CM | POA: Diagnosis not present

## 2015-01-26 DIAGNOSIS — I480 Paroxysmal atrial fibrillation: Secondary | ICD-10-CM | POA: Diagnosis not present

## 2015-01-26 DIAGNOSIS — N184 Chronic kidney disease, stage 4 (severe): Secondary | ICD-10-CM | POA: Diagnosis not present

## 2015-01-26 DIAGNOSIS — E1151 Type 2 diabetes mellitus with diabetic peripheral angiopathy without gangrene: Secondary | ICD-10-CM | POA: Diagnosis not present

## 2015-01-26 DIAGNOSIS — K5900 Constipation, unspecified: Secondary | ICD-10-CM | POA: Diagnosis not present

## 2015-01-26 DIAGNOSIS — H4011X0 Primary open-angle glaucoma, stage unspecified: Secondary | ICD-10-CM | POA: Diagnosis not present

## 2015-01-26 DIAGNOSIS — E1129 Type 2 diabetes mellitus with other diabetic kidney complication: Secondary | ICD-10-CM | POA: Diagnosis not present

## 2015-01-26 DIAGNOSIS — M1990 Unspecified osteoarthritis, unspecified site: Secondary | ICD-10-CM | POA: Diagnosis not present

## 2015-01-26 DIAGNOSIS — G629 Polyneuropathy, unspecified: Secondary | ICD-10-CM | POA: Diagnosis not present

## 2015-01-26 DIAGNOSIS — N390 Urinary tract infection, site not specified: Secondary | ICD-10-CM | POA: Diagnosis not present

## 2015-02-10 DIAGNOSIS — R300 Dysuria: Secondary | ICD-10-CM | POA: Diagnosis not present

## 2015-02-10 DIAGNOSIS — N390 Urinary tract infection, site not specified: Secondary | ICD-10-CM | POA: Diagnosis not present

## 2015-02-21 DIAGNOSIS — N281 Cyst of kidney, acquired: Secondary | ICD-10-CM | POA: Diagnosis not present

## 2015-02-21 DIAGNOSIS — N2889 Other specified disorders of kidney and ureter: Secondary | ICD-10-CM | POA: Diagnosis not present

## 2015-02-21 DIAGNOSIS — N200 Calculus of kidney: Secondary | ICD-10-CM | POA: Diagnosis not present

## 2015-02-21 DIAGNOSIS — N390 Urinary tract infection, site not specified: Secondary | ICD-10-CM | POA: Diagnosis not present

## 2015-02-22 DIAGNOSIS — N2581 Secondary hyperparathyroidism of renal origin: Secondary | ICD-10-CM | POA: Diagnosis not present

## 2015-02-22 DIAGNOSIS — I10 Essential (primary) hypertension: Secondary | ICD-10-CM | POA: Diagnosis not present

## 2015-02-22 DIAGNOSIS — N189 Chronic kidney disease, unspecified: Secondary | ICD-10-CM | POA: Diagnosis not present

## 2015-02-22 DIAGNOSIS — E119 Type 2 diabetes mellitus without complications: Secondary | ICD-10-CM | POA: Diagnosis not present

## 2015-03-01 DIAGNOSIS — H11153 Pinguecula, bilateral: Secondary | ICD-10-CM | POA: Diagnosis not present

## 2015-03-01 DIAGNOSIS — H3531 Nonexudative age-related macular degeneration: Secondary | ICD-10-CM | POA: Diagnosis not present

## 2015-03-01 DIAGNOSIS — Z9849 Cataract extraction status, unspecified eye: Secondary | ICD-10-CM | POA: Diagnosis not present

## 2015-03-01 DIAGNOSIS — G43B0 Ophthalmoplegic migraine, not intractable: Secondary | ICD-10-CM | POA: Diagnosis not present

## 2015-03-01 DIAGNOSIS — H4011X2 Primary open-angle glaucoma, moderate stage: Secondary | ICD-10-CM | POA: Diagnosis not present

## 2015-03-01 DIAGNOSIS — H04123 Dry eye syndrome of bilateral lacrimal glands: Secondary | ICD-10-CM | POA: Diagnosis not present

## 2015-03-01 DIAGNOSIS — H18413 Arcus senilis, bilateral: Secondary | ICD-10-CM | POA: Diagnosis not present

## 2015-03-10 DIAGNOSIS — N342 Other urethritis: Secondary | ICD-10-CM | POA: Diagnosis not present

## 2015-03-10 DIAGNOSIS — R3912 Poor urinary stream: Secondary | ICD-10-CM | POA: Diagnosis not present

## 2015-03-10 DIAGNOSIS — N37 Urethral disorders in diseases classified elsewhere: Secondary | ICD-10-CM | POA: Diagnosis not present

## 2015-03-10 DIAGNOSIS — N138 Other obstructive and reflux uropathy: Secondary | ICD-10-CM | POA: Diagnosis not present

## 2015-03-10 DIAGNOSIS — N401 Enlarged prostate with lower urinary tract symptoms: Secondary | ICD-10-CM | POA: Diagnosis not present

## 2015-04-04 DIAGNOSIS — N37 Urethral disorders in diseases classified elsewhere: Secondary | ICD-10-CM | POA: Diagnosis not present

## 2015-04-04 DIAGNOSIS — R3912 Poor urinary stream: Secondary | ICD-10-CM | POA: Diagnosis not present

## 2015-04-28 ENCOUNTER — Encounter (HOSPITAL_COMMUNITY): Payer: Self-pay

## 2015-04-28 ENCOUNTER — Inpatient Hospital Stay (HOSPITAL_COMMUNITY)
Admission: EM | Admit: 2015-04-28 | Discharge: 2015-04-30 | DRG: 641 | Disposition: A | Discharge status: Home / Self Care | Payer: Medicare Other | Attending: Internal Medicine | Admitting: Internal Medicine

## 2015-04-28 ENCOUNTER — Emergency Department (HOSPITAL_COMMUNITY): Payer: Medicare Other

## 2015-04-28 DIAGNOSIS — Z96641 Presence of right artificial hip joint: Secondary | ICD-10-CM | POA: Diagnosis present

## 2015-04-28 DIAGNOSIS — I959 Hypotension, unspecified: Secondary | ICD-10-CM

## 2015-04-28 DIAGNOSIS — E860 Dehydration: Secondary | ICD-10-CM | POA: Diagnosis not present

## 2015-04-28 DIAGNOSIS — E7800 Pure hypercholesterolemia, unspecified: Secondary | ICD-10-CM | POA: Diagnosis present

## 2015-04-28 DIAGNOSIS — E1122 Type 2 diabetes mellitus with diabetic chronic kidney disease: Secondary | ICD-10-CM | POA: Diagnosis present

## 2015-04-28 DIAGNOSIS — Z794 Long term (current) use of insulin: Secondary | ICD-10-CM

## 2015-04-28 DIAGNOSIS — Z7982 Long term (current) use of aspirin: Secondary | ICD-10-CM | POA: Diagnosis not present

## 2015-04-28 DIAGNOSIS — E785 Hyperlipidemia, unspecified: Secondary | ICD-10-CM | POA: Diagnosis present

## 2015-04-28 DIAGNOSIS — Z7901 Long term (current) use of anticoagulants: Secondary | ICD-10-CM | POA: Diagnosis not present

## 2015-04-28 DIAGNOSIS — K219 Gastro-esophageal reflux disease without esophagitis: Secondary | ICD-10-CM | POA: Diagnosis present

## 2015-04-28 DIAGNOSIS — I2510 Atherosclerotic heart disease of native coronary artery without angina pectoris: Secondary | ICD-10-CM | POA: Diagnosis present

## 2015-04-28 DIAGNOSIS — I480 Paroxysmal atrial fibrillation: Secondary | ICD-10-CM | POA: Diagnosis present

## 2015-04-28 DIAGNOSIS — Z79899 Other long term (current) drug therapy: Secondary | ICD-10-CM

## 2015-04-28 DIAGNOSIS — N400 Enlarged prostate without lower urinary tract symptoms: Secondary | ICD-10-CM | POA: Diagnosis present

## 2015-04-28 DIAGNOSIS — J42 Unspecified chronic bronchitis: Secondary | ICD-10-CM | POA: Diagnosis present

## 2015-04-28 DIAGNOSIS — N390 Urinary tract infection, site not specified: Secondary | ICD-10-CM | POA: Diagnosis present

## 2015-04-28 DIAGNOSIS — N183 Chronic kidney disease, stage 3 (moderate): Secondary | ICD-10-CM | POA: Diagnosis present

## 2015-04-28 DIAGNOSIS — R404 Transient alteration of awareness: Secondary | ICD-10-CM | POA: Diagnosis not present

## 2015-04-28 DIAGNOSIS — R509 Fever, unspecified: Secondary | ICD-10-CM | POA: Diagnosis not present

## 2015-04-28 DIAGNOSIS — J440 Chronic obstructive pulmonary disease with acute lower respiratory infection: Secondary | ICD-10-CM | POA: Diagnosis not present

## 2015-04-28 DIAGNOSIS — R531 Weakness: Secondary | ICD-10-CM | POA: Diagnosis not present

## 2015-04-28 DIAGNOSIS — N179 Acute kidney failure, unspecified: Secondary | ICD-10-CM | POA: Diagnosis present

## 2015-04-28 DIAGNOSIS — R6510 Systemic inflammatory response syndrome (SIRS) of non-infectious origin without acute organ dysfunction: Secondary | ICD-10-CM | POA: Diagnosis not present

## 2015-04-28 DIAGNOSIS — I129 Hypertensive chronic kidney disease with stage 1 through stage 4 chronic kidney disease, or unspecified chronic kidney disease: Secondary | ICD-10-CM | POA: Diagnosis present

## 2015-04-28 DIAGNOSIS — J209 Acute bronchitis, unspecified: Secondary | ICD-10-CM | POA: Diagnosis present

## 2015-04-28 DIAGNOSIS — R05 Cough: Secondary | ICD-10-CM | POA: Diagnosis not present

## 2015-04-28 LAB — CBC WITH DIFFERENTIAL/PLATELET
BASOS ABS: 0 10*3/uL (ref 0.0–0.1)
Basophils Relative: 0 %
EOS PCT: 1 %
Eosinophils Absolute: 0.1 10*3/uL (ref 0.0–0.7)
HEMATOCRIT: 38 % — AB (ref 39.0–52.0)
Hemoglobin: 12.1 g/dL — ABNORMAL LOW (ref 13.0–17.0)
LYMPHS PCT: 8 %
Lymphs Abs: 0.9 10*3/uL (ref 0.7–4.0)
MCH: 29.8 pg (ref 26.0–34.0)
MCHC: 31.8 g/dL (ref 30.0–36.0)
MCV: 93.6 fL (ref 78.0–100.0)
Monocytes Absolute: 0.8 10*3/uL (ref 0.1–1.0)
Monocytes Relative: 7 %
NEUTROS ABS: 8.9 10*3/uL — AB (ref 1.7–7.7)
NEUTROS PCT: 84 %
PLATELETS: 156 10*3/uL (ref 150–400)
RBC: 4.06 MIL/uL — AB (ref 4.22–5.81)
RDW: 14.4 % (ref 11.5–15.5)
WBC: 10.6 10*3/uL — AB (ref 4.0–10.5)

## 2015-04-28 LAB — COMPREHENSIVE METABOLIC PANEL
ALBUMIN: 3 g/dL — AB (ref 3.5–5.0)
ALK PHOS: 76 U/L (ref 38–126)
ALT: 19 U/L (ref 17–63)
ANION GAP: 8 (ref 5–15)
AST: 31 U/L (ref 15–41)
BILIRUBIN TOTAL: 0.6 mg/dL (ref 0.3–1.2)
BUN: 20 mg/dL (ref 6–20)
CO2: 26 mmol/L (ref 22–32)
CREATININE: 2.3 mg/dL — AB (ref 0.61–1.24)
Calcium: 8.9 mg/dL (ref 8.9–10.3)
Chloride: 109 mmol/L (ref 101–111)
GFR calc Af Amer: 28 mL/min — ABNORMAL LOW (ref >60)
GFR calc non Af Amer: 24 mL/min — ABNORMAL LOW (ref >60)
GLUCOSE: 122 mg/dL — AB (ref 65–99)
Potassium: 4.6 mmol/L (ref 3.5–5.1)
SODIUM: 143 mmol/L (ref 135–145)
TOTAL PROTEIN: 5.9 g/dL — AB (ref 6.5–8.1)

## 2015-04-28 LAB — GLUCOSE, CAPILLARY: Glucose-Capillary: 197 mg/dL — ABNORMAL HIGH (ref 65–99)

## 2015-04-28 LAB — I-STAT CG4 LACTIC ACID, ED
LACTIC ACID, VENOUS: 2.06 mmol/L — AB (ref 0.5–2.0)
LACTIC ACID, VENOUS: 1.21 mmol/L (ref 0.5–2.0)

## 2015-04-28 LAB — MRSA PCR SCREENING: MRSA by PCR: POSITIVE — AB

## 2015-04-28 LAB — TROPONIN I: Troponin I: <0.03 ng/mL (ref <0.031)

## 2015-04-28 MED ORDER — TIMOLOL HEMIHYDRATE 0.5 % OP SOLN: 1.0000 [drp] | Freq: Every day | OPHTHALMIC | Status: DC

## 2015-04-28 MED ORDER — APIXABAN 2.5 MG PO TABS
2.5000 mg | ORAL_TABLET | Freq: Two times a day (BID) | ORAL | Status: DC
  Administered 2015-04-28 – 2015-04-30 (×4): 2.5 mg via ORAL
  Filled 2015-04-28 (×4): qty 1

## 2015-04-28 MED ORDER — NITROGLYCERIN 0.4 MG SL SUBL: 0.4000 mg | SUBLINGUAL_TABLET | SUBLINGUAL | Status: DC | PRN

## 2015-04-28 MED ORDER — CHLORHEXIDINE GLUCONATE CLOTH 2 % EX PADS
6.0000 | MEDICATED_PAD | Freq: Every day | CUTANEOUS | Status: DC
  Administered 2015-04-29 – 2015-04-30 (×2): 6 via TOPICAL

## 2015-04-28 MED ORDER — CALCIUM CITRATE-VITAMIN D 500-400 MG-UNIT PO CHEW
1.0000 | CHEWABLE_TABLET | Freq: Every day | ORAL | Status: DC
  Filled 2015-04-28: qty 1

## 2015-04-28 MED ORDER — CALCIUM-VITAMIN D 500-200 MG-UNIT PO TABS: 1.0000 | ORAL_TABLET | Freq: Every day | ORAL | Status: DC

## 2015-04-28 MED ORDER — CYCLOSPORINE 0.05 % OP EMUL
2.0000 [drp] | Freq: Two times a day (BID) | OPHTHALMIC | Status: DC
  Administered 2015-04-28 – 2015-04-30 (×3): 2 [drp] via OPHTHALMIC
  Filled 2015-04-28 (×6): qty 1

## 2015-04-28 MED ORDER — LINAGLIPTIN 5 MG PO TABS
5.0000 mg | ORAL_TABLET | Freq: Every day | ORAL | Status: DC
  Administered 2015-04-29 – 2015-04-30 (×2): 5 mg via ORAL
  Filled 2015-04-28 (×2): qty 1

## 2015-04-28 MED ORDER — HYDROCODONE-ACETAMINOPHEN 10-325 MG PO TABS: 1.0000 | ORAL_TABLET | ORAL | Status: DC | PRN

## 2015-04-28 MED ORDER — SODIUM CHLORIDE 0.9 % IV BOLUS (SEPSIS)
1000.0000 mL | INTRAVENOUS | Status: DC
  Administered 2015-04-28: 1000 mL via INTRAVENOUS

## 2015-04-28 MED ORDER — ASPIRIN EC 81 MG PO TBEC: 81.0000 mg | DELAYED_RELEASE_TABLET | Freq: Every day | ORAL | Status: DC

## 2015-04-28 MED ORDER — PANTOPRAZOLE SODIUM 40 MG PO TBEC
40.0000 mg | DELAYED_RELEASE_TABLET | Freq: Every day | ORAL | Status: DC
  Administered 2015-04-29 – 2015-04-30 (×2): 40 mg via ORAL
  Filled 2015-04-28 (×2): qty 1

## 2015-04-28 MED ORDER — POLYETHYLENE GLYCOL 3350 17 G PO PACK: 17.0000 g | PACK | Freq: Two times a day (BID) | ORAL | Status: DC | PRN

## 2015-04-28 MED ORDER — ATORVASTATIN CALCIUM 40 MG PO TABS
40.0000 mg | ORAL_TABLET | Freq: Every day | ORAL | Status: DC
  Administered 2015-04-29 – 2015-04-30 (×2): 40 mg via ORAL
  Filled 2015-04-28 (×2): qty 1

## 2015-04-28 MED ORDER — INFLUENZA VAC SPLIT QUAD 0.5 ML IM SUSY
0.5000 mL | PREFILLED_SYRINGE | INTRAMUSCULAR | Status: AC
  Administered 2015-04-29: 0.5 mL via INTRAMUSCULAR
  Filled 2015-04-28: qty 0.5

## 2015-04-28 MED ORDER — PANTOPRAZOLE SODIUM 40 MG PO TBEC: 40.0000 mg | DELAYED_RELEASE_TABLET | Freq: Every day | ORAL | Status: DC

## 2015-04-28 MED ORDER — TAMSULOSIN HCL 0.4 MG PO CAPS: 0.4000 mg | ORAL_CAPSULE | ORAL | Status: DC

## 2015-04-28 MED ORDER — TIMOLOL MALEATE 0.5 % OP SOLN
1.0000 [drp] | Freq: Every day | OPHTHALMIC | Status: DC
  Filled 2015-04-28: qty 5

## 2015-04-28 MED ORDER — DEXTROSE 5 % IV SOLN
1.0000 g | INTRAVENOUS | Status: DC
Start: 2015-04-28 — End: 2015-04-30
  Administered 2015-04-28 – 2015-04-29 (×2): 1 g via INTRAVENOUS
  Filled 2015-04-28 (×4): qty 10

## 2015-04-28 MED ORDER — MUPIROCIN 2 % EX OINT
1.0000 "application " | TOPICAL_OINTMENT | Freq: Two times a day (BID) | CUTANEOUS | Status: DC
  Administered 2015-04-29 – 2015-04-30 (×4): 1 via NASAL
  Filled 2015-04-28 (×3): qty 22

## 2015-04-28 MED ORDER — TAMSULOSIN HCL 0.4 MG PO CAPS
0.4000 mg | ORAL_CAPSULE | Freq: Every day | ORAL | Status: DC
  Administered 2015-04-29: 0.4 mg via ORAL
  Filled 2015-04-28: qty 1

## 2015-04-28 MED ORDER — DOCUSATE SODIUM 100 MG PO CAPS
100.0000 mg | ORAL_CAPSULE | Freq: Every day | ORAL | Status: DC
  Administered 2015-04-28 – 2015-04-29 (×2): 100 mg via ORAL
  Filled 2015-04-28 (×3): qty 1

## 2015-04-28 MED ORDER — INSULIN DETEMIR 100 UNIT/ML ~~LOC~~ SOLN
23.0000 [IU] | Freq: Every day | SUBCUTANEOUS | Status: DC
  Administered 2015-04-28 – 2015-04-29 (×2): 23 [IU] via SUBCUTANEOUS
  Filled 2015-04-28 (×3): qty 0.23

## 2015-04-28 MED ORDER — PNEUMOCOCCAL VAC POLYVALENT 25 MCG/0.5ML IJ INJ
0.5000 mL | INJECTION | INTRAMUSCULAR | Status: AC
  Administered 2015-04-29: 0.5 mL via INTRAMUSCULAR
  Filled 2015-04-28: qty 0.5

## 2015-04-28 MED ORDER — ASPIRIN EC 81 MG PO TBEC
81.0000 mg | DELAYED_RELEASE_TABLET | Freq: Every day | ORAL | Status: DC
  Administered 2015-04-28 – 2015-04-30 (×3): 81 mg via ORAL
  Filled 2015-04-28 (×3): qty 1

## 2015-04-28 MED ORDER — SODIUM CHLORIDE 0.9 % IV SOLN
Freq: Once | INTRAVENOUS | Status: AC
  Administered 2015-04-28: 17:00:00 via INTRAVENOUS

## 2015-04-28 MED ORDER — DEXTROSE 5 % IV SOLN
500.0000 mg | INTRAVENOUS | Status: DC
  Administered 2015-04-28 – 2015-04-29 (×2): 500 mg via INTRAVENOUS
  Filled 2015-04-28 (×3): qty 500

## 2015-04-28 MED ORDER — SODIUM CHLORIDE 0.9 % IV BOLUS (SEPSIS): 1000.0000 mL | Freq: Once | INTRAVENOUS | Status: DC

## 2015-04-28 NOTE — ED Provider Notes (Signed)
CSN: [id number]     Arrival date & time 04/28/15  1442 History   First MD Initiated Contact with Patient 04/28/15 1506     Chief Complaint  Patient presents with  . Cough  . Hypotension      HPI  Patient presents for evaluation of a cough and low blood pressure.  He was here via EMS with his wife. He is in a cough for the last 3 days. Has become productive of some thin yellow sputum. No hemoptysis. Has not noticed fever shakes chills or dyspnea. Wife was taking him to his primary care physician at [facility] today. When they got to the office she helped him out of the car and wheelchair. She states that he "was having trouble being responsive".  He was confused. Found have a blood pressure of 70. Temperature 98.9. He was laid supine, EMS was summoned. Transferred here. Upon arrival he is awake and mentating better/normal for the patient and wife.  History of previous MI in paroxysmal A. fib. States that he checks his blood pressure twice per day and normally runs around 100. He takes Imdur in the morning. And twice a day Lopressor.  No recent change in medications. No recent episodes of weeks syncope near-syncope.  Has had poor appetite needed little for the last several days. He urinated once per day but less than usual. 3 stools today. The last was "diarrhea" no nausea or vomiting or poor appetite. Has not had fever shakes chills or rigors.  Past Medical History  Diagnosis Date  . Hypertension   . Hypercholesterolemia   . GERD (gastroesophageal reflux disease)   . Transient global amnesia 2008   . Type 2 diabetes mellitus with renal manifestations, controlled ([place])   . Paroxysmal atrial fibrillation (HCC) 08/29/2014    Chads2VASC score 5   . Kidney disease, chronic, stage III (GFR 30-59 ml/min) 08/29/2014  . Hyperlipidemia 12/02/2008  . History of kidney stones   . CAD (coronary artery disease), native coronary artery     Catheterization 2008 showing moderate nonobstructive  disease in LAD, circumflex and right coronary artery, no lesions greater than 40%.  EF of 35% at that time.    Past Surgical History  Procedure Laterality Date  . Total hip arthroplasty Right   . Tonsillectomy    . Cystoscopy w/ ureteral stent placement Left 03/06/2013    Procedure: CYSTOSCOPY WITH RETROGRADE PYELOGRAM/URETERAL STENT PLACEMENT;  Surgeon: Malka So, MD;  Location: [facility];  Service: Urology;  Laterality: Left;  . Joint replacement     Family History  Problem Relation Age of Onset  . Heart attack Father   . Emphysema Brother    Social History  Substance Use Topics  . Smoking status: Never Smoker   . Smokeless tobacco: None  . Alcohol Use: 0.6 oz/week    1 Cans of beer per week     Comment: occasionally    Review of Systems  Constitutional: Positive for activity change and appetite change. Negative for fever, chills, diaphoresis and fatigue.  HENT: Negative for mouth sores, sore throat and trouble swallowing.   Eyes: Negative for visual disturbance.  Respiratory: Positive for cough. Negative for chest tightness, shortness of breath and wheezing.   Cardiovascular: Negative for chest pain.       Hypotension  Gastrointestinal: Positive for diarrhea. Negative for nausea, vomiting, abdominal pain and abdominal distention.  Endocrine: Negative for polydipsia, polyphagia and polyuria.  Genitourinary: Negative for dysuria, frequency and hematuria.  Musculoskeletal: Negative  for gait problem.  Skin: Negative for color change, pallor and rash.  Neurological: Negative for dizziness, syncope, light-headedness and headaches.       Confusion-resolved  Hematological: Does not bruise/bleed easily.  Psychiatric/Behavioral: Negative for behavioral problems and confusion.      Allergies  Review of patient's allergies indicates no known allergies.  Home Medications   Prior to Admission medications   Medication Sig Start Date End Date Taking? Authorizing Provider  apixaban  (ELIQUIS) 2.5 MG TABS tablet Take 1 tablet (2.5 mg total) by mouth 2 (two) times daily. 08/31/14  Yes Brittainy Sherlynn Carbon, PA-C  aspirin EC 81 MG tablet Take 1 tablet (81 mg total) by mouth daily. Patient taking differently: Take 162 mg by mouth at bedtime.  08/31/14  Yes Brittainy Sherlynn Carbon, PA-C  atorvastatin (LIPITOR) 40 MG tablet Take 40 mg by mouth daily.   Yes Historical Provider, MD  Calcium Carbonate-Vitamin D (CALCIUM-VITAMIN D) 500-200 MG-UNIT per tablet Take 1 tablet by mouth daily.   Yes Historical Provider, MD  cycloSPORINE (RESTASIS) 0.05 % ophthalmic emulsion Place 2 drops into both eyes 2 (two) times daily. Right eye in the morning and left eye in the evening   Yes Historical Provider, MD  docusate sodium (COLACE) 100 MG capsule Take 100 mg by mouth at bedtime.   Yes Historical Provider, MD  fish oil-omega-3 fatty acids 1000 MG capsule Take 1 capsule (1 g total) by mouth daily. Stop taking if having diarrhea 02/05/12  Yes Ripudeep K Rai, MD  insulin aspart (NOVOLOG) 100 UNIT/ML injection Inject 0-15 Units into the skin 4 (four) times daily -  with meals and at bedtime. Sliding scale, 70-120-0 units, 121-150 2 units, 151-200 3 units, 201-250 3 units 251-300 8 units 301-350 11 units, 351-400 15 units, beyond call dr   Yes Historical Provider, MD  insulin detemir (LEVEMIR) 100 UNIT/ML injection Inject 23 Units into the skin at bedtime.    Yes Historical Provider, MD  isosorbide mononitrate (IMDUR) 30 MG 24 hr tablet Take 30 mg by mouth daily.   Yes Historical Provider, MD  linagliptin (TRADJENTA) 5 MG TABS tablet Take 1 tablet (5 mg total) by mouth daily. 02/05/12  Yes Ripudeep Jenna Luo, MD  metoprolol tartrate (LOPRESSOR) 25 MG tablet Take 1 tablet (25 mg total) by mouth 2 (two) times daily. 08/31/14  Yes Brittainy Sherlynn Carbon, PA-C  Multiple Vitamins-Minerals (MULTIVITAMIN WITH MINERALS) tablet Take 1 tablet by mouth daily.    Yes Historical Provider, MD  Multiple Vitamins-Minerals (PRESERVISION AREDS  2) CAPS Take 1 capsule by mouth 2 (two) times daily.    Yes Historical Provider, MD  nitroGLYCERIN (NITROSTAT) 0.4 MG SL tablet Place 1 tablet (0.4 mg total) under the tongue every 5 (five) minutes x 3 doses as needed for chest pain. 08/31/14  Yes Brittainy Sherlynn Carbon, PA-C  omeprazole (PRILOSEC) 20 MG capsule Take 20 mg by mouth daily.    Yes Historical Provider, MD  polyethylene glycol (MIRALAX / GLYCOLAX) packet Take 17 g by mouth 2 (two) times daily as needed (for constipation).    Yes Historical Provider, MD  Pyridoxine HCl (VITAMIN B-6) 500 MG tablet Take 500 mg by mouth 2 (two) times daily.    Yes Historical Provider, MD  timolol (BETIMOL) 0.5 % ophthalmic solution Place 1 drop into the left eye daily.    Yes Historical Provider, MD  HYDROcodone-acetaminophen (NORCO) 10-325 MG per tablet Take 1-2 tablets by mouth every 4 (four) hours as needed for pain. Patient not  taking: Reported on 04/28/2015 03/16/13   Kathie Rhodes, MD  tamsulosin (FLOMAX) 0.4 MG CAPS capsule Take 1 capsule (0.4 mg total) by mouth daily after supper. Patient not taking: Reported on 04/28/2015 03/16/13   Kathie Rhodes, MD   BP 97/62 mmHg  Pulse 81  Temp(Src) 98.2 F (36.8 C) (Rectal)  Resp 24  Ht $R'6\' 2"'pq$  (1.88 m)  Wt 215 lb (97.523 kg)  BMI 27.59 kg/m2  SpO2 98% Physical Exam  Constitutional: He is oriented to person, place, and time. He appears well-developed and well-nourished. No distress.  Awake and alert. Conversant. Articulate. Lucid. Neurologically intact moving all [place]:  Head: Normocephalic.  Eyes: Conjunctivae are normal. Pupils are equal, round, and reactive to light. No scleral icterus.  Neck: Normal range of motion. Neck supple. No thyromegaly present.  Cardiovascular: Normal rate and regular rhythm.  Exam reveals no gallop and no friction rub.   No murmur heard. Sinus on the monitor. Rate in the 80s. Systolic blood pressure 82.  Pulmonary/Chest: Effort normal and breath sounds normal. No  respiratory distress. He has no wheezes. He has no rales.  Clear bilateral breath sounds. No increased work of breathing. saturating 97%  Abdominal: Soft. Bowel sounds are normal. He exhibits no distension. There is no tenderness. There is no rebound.  Musculoskeletal: Normal range of motion.  Neurological: He is alert and oriented to person, place, and time.  Skin: Skin is warm and dry. No rash noted.  Psychiatric: He has a normal mood and affect. His behavior is normal.    ED Course  Procedures (including critical care time) Labs Review Labs Reviewed  COMPREHENSIVE METABOLIC PANEL - Abnormal; Notable for the following:    Glucose, Bld 122 (*)    Creatinine, Ser 2.30 (*)    Total Protein 5.9 (*)    Albumin 3.0 (*)    GFR calc non Af Amer 24 (*)    GFR calc Af Amer 28 (*)    All other components within normal limits  I-STAT CG4 LACTIC ACID, ED - Abnormal; Notable for the following:    Lactic Acid, Venous 2.06 (*)    All other components within normal limits  URINE CULTURE  CULTURE, BLOOD (ROUTINE X 2)  CULTURE, BLOOD (ROUTINE X 2)  TROPONIN I  CBC WITH DIFFERENTIAL/PLATELET  URINALYSIS, ROUTINE W REFLEX MICROSCOPIC (NOT AT [facility])  I-STAT CG4 LACTIC ACID, ED    Imaging Review Dg Chest Portable 1 View  04/28/2015  CLINICAL DATA:  Productive cough for 3 days.  History of fever. EXAM: PORTABLE CHEST 1 VIEW COMPARISON:  09/25/2014 FINDINGS: Heart and mediastinum are within normal limits and stable. Lungs are clear without airspace disease. Question small calcified granulomas in the right lung. Trachea is midline. Multiple monitoring lines overlying the left side of the chest. IMPRESSION: No acute chest findings. Electronically Signed   By: Markus Daft M.D.   On: 04/28/2015 17:36   I have personally reviewed and evaluated these images and lab results as part of my medical decision-making.   EKG Interpretation   Date/Time:  Thursday April 28 2015 14:57:29 EDT Ventricular Rate:   66 PR Interval:  189 QRS Duration: 154 QT Interval:  468 QTC Calculation: 490 R Axis:   -69 Text Interpretation:  Sinus rhythm RBBB and LAFB Confirmed by Jeneen Rinks  MD,  [place] ([id number]) on 04/28/2015 3:35:20 PM      MDM   Final diagnoses:  Hypotension, unspecified hypotension type   Pressures improved. Has had  a total of 2 L of fluid. Is afebrile. Not hypoxemic. Clinically and radiographically does not have pneumonia. Lactate only at 2.06. I do not find an etiology, or abnormal findings to confirm sepsis. He is on Imdur and twice a day Lopressor and has been minimally intaking fluids for the last 2-3 days. This may be a constellation of those. Discussed the case with hospitalist. He'll be admitted. We will hold his antihypertensives. Continue fluids.    Tanna Furry, MD 04/28/15 [phone]

## 2015-04-28 NOTE — ED Notes (Signed)
Pt received 581mL on EMS truck.

## 2015-04-28 NOTE — Progress Notes (Signed)
Patient mrsa pcr is positive.  Orders have been placed per protocol

## 2015-04-28 NOTE — ED Notes (Signed)
Pt. Presents from PCP with complaint of productive cough x3 days. PCP noted pt. To have temp. Pt.afebrile here. Denies pain. AxO x4. Pt. BP 86/45 on arrival.

## 2015-04-28 NOTE — H&P (Signed)
History and Physical  CREG GILMER BLT:[id number] DOB: 06-May-1925 DOA: 04/28/2015  PCP: Gaspar Garbe, MD   Chief Complaint: Hypotension  History of Present Illness:  - Patient is a 81 year old male with history of CAD, HTN, paroxysmal afib, DMII.  - He was brought from PCP office after he was found to be fatigue, disoriented and generally weak.  - This happened suddenly today while visiting PCP clinic for cough with his daughter. He was a little clammy as well.  - No slurred speech, focal weakness or focal sensory complaints.  - Cough has been going on for 3 days, dry and sometimes productive of clear sputum.  - No fever, chills, chest pain or dyspnea. He had mild URI symptoms with sore throat a few days ago.  - No N/V/D/C/abd pain, wounds, lines, catheter, trauma, or dysuria. He had 3 BM today and the last one was soft.  - No dizziness or lightheadedness. No headache. No changes in his medications recently.  - He has had poor oral intake for the last few days. Today only had a muffin.   Review of Systems:  CONSTITUTIONAL:  No night sweats.  +fatigue, malaise, lethargy.  No fever or chills. Eyes:  No visual changes.  No eye pain.  No eye discharge.   ENT:    No epistaxis.  No sinus pain.  No sore throat.  No ear pain.  No congestion. RESPIRATORY:  +cough.  No wheeze.  No hemoptysis.  No shortness of breath. CARDIOVASCULAR:  No chest pains.  No palpitations. GASTROINTESTINAL:  No abdominal pain.  No nausea or vomiting.  No diarrhea or constipation.  No hematemesis.  No hematochezia.  No melena. GENITOURINARY:  No urgency.  No frequency.  No dysuria.  No hematuria.  No obstructive symptoms.  No discharge.  No pain.  No significant abnormal bleeding. MUSCULOSKELETAL:  No musculoskeletal pain.  No joint swelling.  No arthritis. NEUROLOGICAL:  No confusion.  +weakness. No headache. No seizure. PSYCHIATRIC:  No depression. No anxiety. No suicidal ideation. SKIN:  No  rashes.  No lesions.  No wounds. ENDOCRINE:  No unexplained weight loss.  No polydipsia.  No polyuria.  No polyphagia. HEMATOLOGIC:  No anemia.  No purpura.  No petechiae.  No bleeding.  ALLERGIC AND IMMUNOLOGIC:  No pruritus.  No swelling Other:  Past Medical and Surgical History:   Past Medical History  Diagnosis Date  . Hypertension   . Hypercholesterolemia   . GERD (gastroesophageal reflux disease)   . Transient global amnesia 2008   . Type 2 diabetes mellitus with renal manifestations, controlled (HCC)   . Paroxysmal atrial fibrillation (HCC) 08/29/2014    Chads2VASC score 5   . Kidney disease, chronic, stage III (GFR 30-59 ml/min) 08/29/2014  . Hyperlipidemia 12/02/2008  . History of kidney stones   . CAD (coronary artery disease), native coronary artery     Catheterization 2008 showing moderate nonobstructive disease in LAD, circumflex and right coronary artery, no lesions greater than 40%.  EF of 35% at that time.    Past Surgical History  Procedure Laterality Date  . Total hip arthroplasty Right   . Tonsillectomy    . Cystoscopy w/ ureteral stent placement Left 03/06/2013    Procedure: CYSTOSCOPY WITH RETROGRADE PYELOGRAM/URETERAL STENT PLACEMENT;  Surgeon: Anner Crete, MD;  Location: [facility];  Service: Urology;  Laterality: Left;  . Joint replacement      Social History:   reports that he has never smoked. He does not  have any smokeless tobacco history on file. He reports that he drinks about 0.6 oz of alcohol per week. He reports that he does not use illicit drugs.   No Known Allergies  Family History  Problem Relation Age of Onset  . Heart attack Father   . Emphysema Brother       Prior to Admission medications   Medication Sig Start Date End Date Taking? Authorizing Provider  apixaban (ELIQUIS) 2.5 MG TABS tablet Take 1 tablet (2.5 mg total) by mouth 2 (two) times daily. 08/31/14  Yes Brittainy Erie Noe, PA-C  aspirin EC 81 MG tablet Take 1 tablet (81 mg total)  by mouth daily. Patient taking differently: Take 162 mg by mouth at bedtime.  08/31/14  Yes Brittainy Erie Noe, PA-C  atorvastatin (LIPITOR) 40 MG tablet Take 40 mg by mouth daily.   Yes Historical Provider, MD  Calcium Carbonate-Vitamin D (CALCIUM-VITAMIN D) 500-200 MG-UNIT per tablet Take 1 tablet by mouth daily.   Yes Historical Provider, MD  cycloSPORINE (RESTASIS) 0.05 % ophthalmic emulsion Place 2 drops into both eyes 2 (two) times daily. Right eye in the morning and left eye in the evening   Yes Historical Provider, MD  docusate sodium (COLACE) 100 MG capsule Take 100 mg by mouth at bedtime.   Yes Historical Provider, MD  fish oil-omega-3 fatty acids 1000 MG capsule Take 1 capsule (1 g total) by mouth daily. Stop taking if having diarrhea 02/05/12  Yes Ripudeep K Rai, MD  insulin aspart (NOVOLOG) 100 UNIT/ML injection Inject 0-15 Units into the skin 4 (four) times daily -  with meals and at bedtime. Sliding scale, 70-120-0 units, 121-150 2 units, 151-200 3 units, 201-250 3 units 251-300 8 units 301-350 11 units, 351-400 15 units, beyond call dr   Yes Historical Provider, MD  insulin detemir (LEVEMIR) 100 UNIT/ML injection Inject 23 Units into the skin at bedtime.    Yes Historical Provider, MD  isosorbide mononitrate (IMDUR) 30 MG 24 hr tablet Take 30 mg by mouth daily.   Yes Historical Provider, MD  linagliptin (TRADJENTA) 5 MG TABS tablet Take 1 tablet (5 mg total) by mouth daily. 02/05/12  Yes Ripudeep Krystal Eaton, MD  metoprolol tartrate (LOPRESSOR) 25 MG tablet Take 1 tablet (25 mg total) by mouth 2 (two) times daily. 08/31/14  Yes Brittainy Erie Noe, PA-C  Multiple Vitamins-Minerals (MULTIVITAMIN WITH MINERALS) tablet Take 1 tablet by mouth daily.    Yes Historical Provider, MD  Multiple Vitamins-Minerals (PRESERVISION AREDS 2) CAPS Take 1 capsule by mouth 2 (two) times daily.    Yes Historical Provider, MD  nitroGLYCERIN (NITROSTAT) 0.4 MG SL tablet Place 1 tablet (0.4 mg total) under the tongue  every 5 (five) minutes x 3 doses as needed for chest pain. 08/31/14  Yes Brittainy Erie Noe, PA-C  omeprazole (PRILOSEC) 20 MG capsule Take 20 mg by mouth daily.    Yes Historical Provider, MD  polyethylene glycol (MIRALAX / GLYCOLAX) packet Take 17 g by mouth 2 (two) times daily as needed (for constipation).    Yes Historical Provider, MD  Pyridoxine HCl (VITAMIN B-6) 500 MG tablet Take 500 mg by mouth 2 (two) times daily.    Yes Historical Provider, MD  timolol (BETIMOL) 0.5 % ophthalmic solution Place 1 drop into the left eye daily.    Yes Historical Provider, MD  HYDROcodone-acetaminophen (NORCO) 10-325 MG per tablet Take 1-2 tablets by mouth every 4 (four) hours as needed for pain. Patient not taking: Reported on 04/28/2015  03/16/13   Kathie Rhodes, MD  tamsulosin (FLOMAX) 0.4 MG CAPS capsule Take 1 capsule (0.4 mg total) by mouth daily after supper. Patient not taking: Reported on 04/28/2015 03/16/13   Kathie Rhodes, MD    Physical Exam: BP 97/62 mmHg  Pulse 81  Temp(Src) 98.2 F (36.8 C) (Rectal)  Resp 24  Ht $R'6\' 2"'rf$  (1.88 m)  Wt 97.523 kg (215 lb)  BMI 27.59 kg/m2  SpO2 98%  GENERAL : Well developed, well nourished, alert and cooperative, and appears to be in no acute distress. HEAD: normocephalic. EYES: PERRL, EOMI. Marland Kitchen EARS: hearing grossly intact. NOSE: No nasal discharge. THROAT: Oral cavity and pharynx normal. No inflammation, swelling, exudate, or lesions.  NECK: Neck supple. CARDIAC: Normal S1 and S2. No S3, S4 or murmurs. Rhythm is regular. There is no peripheral edema, LUNGS: Clear to auscultation and percussion without rales, rhonchi, wheezing or diminished breath sounds. ABDOMEN: Positive bowel sounds. Soft, nondistended, nontender. No guarding or rebound. No masses. EXTREMITIES: No significant deformity or joint abnormality. No edema.No varicosities. NEUROLOGICAL: The mental examination revealed the patient was oriented to person, place, and time.CN II-XII intact. Strength  and sensation symmetric and intact throughout. SKIN: Skin normal color, texture and turgor with no lesions or eruptions. PSYCHIATRIC:  The patient was able to demonstrate good judgement and reason, without hallucinations, abnormal affect or abnormal behaviors during the examination. Patient is not suicidal.          Labs on Admission:  Reviewed.   Radiological Exams on Admission: Dg Chest Portable 1 View  04/28/2015  CLINICAL DATA:  Productive cough for 3 days.  History of fever. EXAM: PORTABLE CHEST 1 VIEW COMPARISON:  09/25/2014 FINDINGS: Heart and mediastinum are within normal limits and stable. Lungs are clear without airspace disease. Question small calcified granulomas in the right lung. Trachea is midline. Multiple monitoring lines overlying the left side of the chest. IMPRESSION: No acute chest findings. Electronically Signed   By: Markus Daft M.D.   On: 04/28/2015 17:36    EKG:  Independently reviewed. No ischemic changes. No changes comparing to prior EKG.   Assessment/Plan  Hypotension:  Poor oral intake/medication ( iatrogenic) vs. Sepsis  Bcx sent, started on ceft/azithro May continue azithro for possible bronchitis CBC pending  LA normalized . Responded to 2 L of IVF. Will hold IVF for now He said BP usually runs 90-100s No signs/symptoms to suggest a vascular event ( cardiac/neuro). Responded well to IVF.   AKI on CKD Likely prerenal  Will check in am ( got 2L of NS today)  HTN : holding BP meds today  DMII: continue home meds  BPH: on flomax  Afib: on eliquis  CAD: last cath in 2008, non obstructive disease. On asp and statin.            Last Echo in March 2016 with LVF and normal EF  DVT prophylaxis: on Eliquis.  GI prophylaxis: on PPI Code Status: Full    Gennaro Africa M.D [facility]

## 2015-04-28 NOTE — ED Notes (Signed)
Dr. Jeneen Rinks MD at bedside.

## 2015-04-28 NOTE — ED Notes (Signed)
This RN spoke with Dr. Jeneen Rinks who advised okay to start antibiotics before 2nd set of blood cultures drawn.

## 2015-04-29 ENCOUNTER — Encounter (HOSPITAL_COMMUNITY): Payer: Self-pay | Admitting: *Deleted

## 2015-04-29 DIAGNOSIS — J440 Chronic obstructive pulmonary disease with acute lower respiratory infection: Secondary | ICD-10-CM

## 2015-04-29 DIAGNOSIS — E860 Dehydration: Secondary | ICD-10-CM | POA: Diagnosis not present

## 2015-04-29 DIAGNOSIS — R6510 Systemic inflammatory response syndrome (SIRS) of non-infectious origin without acute organ dysfunction: Secondary | ICD-10-CM

## 2015-04-29 DIAGNOSIS — N390 Urinary tract infection, site not specified: Secondary | ICD-10-CM

## 2015-04-29 DIAGNOSIS — I959 Hypotension, unspecified: Secondary | ICD-10-CM

## 2015-04-29 LAB — GLUCOSE, CAPILLARY
GLUCOSE-CAPILLARY: 184 mg/dL — AB (ref 65–99)
Glucose-Capillary: 109 mg/dL — ABNORMAL HIGH (ref 65–99)
Glucose-Capillary: 183 mg/dL — ABNORMAL HIGH (ref 65–99)
Glucose-Capillary: 282 mg/dL — ABNORMAL HIGH (ref 65–99)

## 2015-04-29 LAB — BASIC METABOLIC PANEL
Anion gap: 10 (ref 5–15)
BUN: 22 mg/dL — ABNORMAL HIGH (ref 6–20)
CALCIUM: 8.7 mg/dL — AB (ref 8.9–10.3)
CO2: 25 mmol/L (ref 22–32)
CREATININE: 2.06 mg/dL — AB (ref 0.61–1.24)
Chloride: 106 mmol/L (ref 101–111)
GFR calc non Af Amer: 27 mL/min — ABNORMAL LOW (ref >60)
GFR, EST AFRICAN AMERICAN: 31 mL/min — AB (ref >60)
Glucose, Bld: 322 mg/dL — ABNORMAL HIGH (ref 65–99)
Potassium: 4.1 mmol/L (ref 3.5–5.1)
SODIUM: 141 mmol/L (ref 135–145)

## 2015-04-29 LAB — URINALYSIS, ROUTINE W REFLEX MICROSCOPIC
BILIRUBIN URINE: NEGATIVE
Ketones, ur: NEGATIVE mg/dL
Nitrite: NEGATIVE
PROTEIN: NEGATIVE mg/dL
Specific Gravity, Urine: 1.026 (ref 1.005–1.030)
Urobilinogen, UA: 0.2 mg/dL (ref 0.0–1.0)
pH: 6 (ref 5.0–8.0)

## 2015-04-29 LAB — URINE MICROSCOPIC-ADD ON

## 2015-04-29 MED ORDER — LEVALBUTEROL HCL 1.25 MG/0.5ML IN NEBU
1.2500 mg | INHALATION_SOLUTION | Freq: Three times a day (TID) | RESPIRATORY_TRACT | Status: DC
  Administered 2015-04-29 (×2): 1.25 mg via RESPIRATORY_TRACT
  Filled 2015-04-29 (×3): qty 0.5

## 2015-04-29 MED ORDER — INSULIN ASPART 100 UNIT/ML ~~LOC~~ SOLN
0.0000 [IU] | SUBCUTANEOUS | Status: DC
  Administered 2015-04-29: 5 [IU] via SUBCUTANEOUS
  Administered 2015-04-29 (×2): 2 [IU] via SUBCUTANEOUS
  Administered 2015-04-30: 3 [IU] via SUBCUTANEOUS

## 2015-04-29 MED ORDER — GUAIFENESIN-CODEINE 100-10 MG/5ML PO SOLN
5.0000 mL | ORAL | Status: DC | PRN
  Administered 2015-04-29 (×3): 5 mL via ORAL
  Filled 2015-04-29 (×3): qty 5

## 2015-04-29 MED ORDER — CALCIUM CARBONATE-VITAMIN D 500-200 MG-UNIT PO TABS
1.0000 | ORAL_TABLET | Freq: Every day | ORAL | Status: DC
  Administered 2015-04-30: 1 via ORAL
  Filled 2015-04-29: qty 1

## 2015-04-29 NOTE — Progress Notes (Signed)
Pt transferred to [facility] per bed accompanied by nurse tech & RN with all belongings including small tablet he has in his position. Glasses on face Wife aware of transfer

## 2015-04-29 NOTE — Progress Notes (Signed)
Utilization Review Completed.  

## 2015-04-29 NOTE — Progress Notes (Signed)
Called Rachael RN on [facility] -given report on pt.aware pt will be coming per w/c after am meds given

## 2015-04-29 NOTE — Care Management Note (Signed)
Case Management Note  Patient Details  Name: Victor Wallace MRN: [id number] Date of Birth: 1924/08/15  Subjective/Objective:                  Date-10-28 Initial Assessment Spoke with patient at the bedside along with wife Victor Wallace [phone].  Introduced self as Tourist information centre manager and explained role in discharge planning and how to be reached.  Verified patient lives [place] in a house with spouse.  Verified patient anticipates to go home with spouse, at time of discharge and will have part-time supervision by family  at this time to best of their knowledge.  Patient has DME cane walker  wheelchair  Expressed potential need for no other DME.  Patient denied  needing help with their medication.  Patient  is driven by wife to MD appointments.  Verified patient has PCP.  Plan: CM will continue to follow for discharge planning and [facility] resources.   Carles Collet RN BSN CM [phone]   Action/Plan:   Expected Discharge Date:                  Expected Discharge Plan:  Home/Self Care  In-House Referral:     Discharge planning Services  CM Consult  Post Acute Care Choice:    Choice offered to:     DME Arranged:    DME Agency:     HH Arranged:    HH Agency:     Status of Service:  In process, will continue to follow  Medicare Important Message Given:    Date Medicare IM Given:    Medicare IM give by:    Date Additional Medicare IM Given:    Additional Medicare Important Message give by:     If discussed at [place] of Stay Meetings, dates discussed:    Additional Comments:  Carles Collet, RN 04/29/2015, 2:46 PM

## 2015-04-29 NOTE — Progress Notes (Signed)
Report received from [place], [place] for transfer to [phone]

## 2015-04-29 NOTE — Progress Notes (Signed)
NURSING PROGRESS NOTE  Victor Wallace [id number] Transfer Data: 04/29/2015 11:37 AM Attending Provider: Reyne Dumas, MD XHF:[id number] W, MD Code Status: FULL  Victor Wallace is a 81 y.o. male patient transferred from [place] -No acute distress noted.  -No complaints of shortness of breath.  -No complaints of chest pain.    Blood pressure 152/71, pulse 74, temperature 97.9 F (36.6 C), temperature source Oral, resp. rate 20, height $RemoveBe'6\' 2"'NSVLOgPTw$  (1.88 m), weight 94.5 kg (208 lb 5.4 oz), SpO2 99 %.     Allergies:  Review of patient's allergies indicates no known allergies.  Past Medical History:   has a past medical history of Hypertension; Hypercholesterolemia; GERD (gastroesophageal reflux disease); Transient global amnesia (2008 ); Type 2 diabetes mellitus with renal manifestations, controlled ([place]); Paroxysmal atrial fibrillation ([place]) (08/29/2014); Kidney disease, chronic, stage III (GFR 30-59 ml/min) (08/29/2014); Hyperlipidemia (12/02/2008); History of kidney stones; and CAD (coronary artery disease), native coronary artery.  Past Surgical History:   has past surgical history that includes Total hip arthroplasty (Right); Tonsillectomy; Cystoscopy w/ ureteral stent placement (Left, 03/06/2013); and Joint replacement.  Social History:   reports that he has never smoked. He does not have any smokeless tobacco history on file. He reports that he drinks about 0.6 oz of alcohol per week. He reports that he does not use illicit drugs.  Skin: intact  Patient/Family orientated to room. Information packet given to patient/family. Admission inpatient armband information verified with patient/family to include name and date of birth and placed on patient arm. Side rails up x 2, fall assessment and education completed with patient/family. Patient/family able to verbalize understanding of risk associated with falls and verbalized understanding to call for assistance before getting out of bed. Call  light within reach. Patient/family able to voice and demonstrate understanding of unit orientation instructions.    Will continue to evaluate and treat per MD orders.

## 2015-04-29 NOTE — Progress Notes (Addendum)
[facility] PROGRESS NOTE  Victor Wallace:[id number] DOB: 1924/12/21 DOA: 04/28/2015 PCP: Haywood Pao, MD  Length of stay: 1   Assessment/Plan: Principal Problem:   SIRS (systemic inflammatory response syndrome) (HCC)-likely in the setting of UTI, improving  with IV fluids, blood pressure stable, lactic acid is improved from 2.06> 1.21  Acute on Chronic kidney disease stage III-baseline creatinine 1.8-2.0, creatinine 2.3 upon admission, improving with IV fluids    Hypotension-secondary to dehydration, improved with IV fluids    UTI (urinary tract infection)-continue Rocephin, follow urine culture. History of BPH, will restart Flomax    Bronchitis, chronic obstructive w acute bronchitis (HCC)-start patient on Xopenex nebulizer treatments, symptoms mostly upper airway, started on Robitussin-DM  Diabetes mellitus patient on Levemir and NovoLog, continue Lantus and sliding scale insulin, hold tradjenta   Paroxysmal atrial fibrillation Chads2VASC score 5, on eliquis   DVT prophylaxsis eliquis  Code Status:      Code Status Orders        Start     Ordered   04/28/15 2113  Full code   Continuous     04/28/15 2112     Family Communication: family updated about patient's clinical progress Disposition Plan:  Transfer to telemetry, Anticipate discharge tomorrow   Brief narrative: 81 year old male with history of CAD, HTN, paroxysmal afib, DMII.  - He was brought from PCP office after he was found to be fatigue, disoriented and generally weak.  - This happened suddenly today while visiting PCP clinic for cough with his daughter. He was a little clammy as well.  - No slurred speech, focal weakness or focal sensory complaints.  - Cough has been going on for 3 days, dry and sometimes productive of clear sputum.  - No fever, chills, chest pain or dyspnea. He had mild URI symptoms with sore throat a few days ago.  - No N/V/D/C/abd pain, wounds, lines,  catheter, trauma, or dysuria. He had 3 BM today and the last one was soft.  - No dizziness or lightheadedness. No headache. No changes in his medications recently.   Consultants:  None  Procedures:  None  Antibiotics: Anti-infectives    Start     Dose/Rate Route Frequency Ordered Stop   04/28/15 1530  cefTRIAXone (ROCEPHIN) 1 g in dextrose 5 % 50 mL IVPB     1 g 100 mL/hr over 30 Minutes Intravenous Every 24 hours 04/28/15 1521     04/28/15 1530  azithromycin (ZITHROMAX) 500 mg in dextrose 5 % 250 mL IVPB     500 mg 250 mL/hr over 60 Minutes Intravenous Every 24 hours 04/28/15 1521           HPI/Subjective: Patient complaining of upper airway wheezing, chest tightness, cough for 3-4 days  Objective: Filed Vitals:   04/28/15 2102 04/29/15 0000 04/29/15 0400 04/29/15 0700  BP: 113/63 98/42 133/77 133/73  Pulse:      Temp: 97.4 F (36.3 C) 97.9 F (36.6 C) 98.1 F (36.7 C) 98.7 F (37.1 C)  TempSrc: Oral Oral Oral Oral  Resp: $Remo'18 15 23   'hLlxD$ Height: $Rem'6\' 2"'ZfDF$  (1.88 m)     Weight: 95.709 kg (211 lb)     SpO2: 98% 96% 97%     Intake/Output Summary (Last 24 hours) at 04/29/15 1042 Last data filed at 04/29/15 1001  Gross per 24 hour  Intake   2460 ml  Output    200 ml  Net   2260 ml  Exam:  General: No acute respiratory distress Lungs: Clear to auscultation bilaterally, upper airway wheezes or crackles Cardiovascular: Regular rate and rhythm without murmur gallop or rub normal S1 and S2 Abdomen: Nontender, nondistended, soft, bowel sounds positive, no rebound, no ascites, no appreciable mass Extremities: No significant cyanosis, clubbing, or edema bilateral lower extremities     Data Review   Micro Results Recent Results (from the past 240 hour(s))  MRSA PCR Screening     Status: Abnormal   Collection Time: 04/28/15  9:30 PM  Result Value Ref Range Status   MRSA by PCR POSITIVE (A) NEGATIVE Final    Comment:        The GeneXpert MRSA Assay (FDA approved  for NASAL specimens only), is one component of a comprehensive MRSA colonization surveillance program. It is not intended to diagnose MRSA infection nor to guide or monitor treatment for MRSA infections. RESULT CALLED TO, READ BACK BY AND VERIFIED WITH: [email or web address]  04/13/15 [facility]     Radiology Reports Dg Chest Portable 1 View  04/28/2015  CLINICAL DATA:  Productive cough for 3 days.  History of fever. EXAM: PORTABLE CHEST 1 VIEW COMPARISON:  09/25/2014 FINDINGS: Heart and mediastinum are within normal limits and stable. Lungs are clear without airspace disease. Question small calcified granulomas in the right lung. Trachea is midline. Multiple monitoring lines overlying the left side of the chest. IMPRESSION: No acute chest findings. Electronically Signed   By: Markus Daft M.D.   On: 04/28/2015 17:36     CBC  Recent Labs Lab 04/28/15 1847  WBC 10.6*  HGB 12.1*  HCT 38.0*  PLT 156  MCV 93.6  MCH 29.8  MCHC 31.8  RDW 14.4  LYMPHSABS 0.9  MONOABS 0.8  EOSABS 0.1  BASOSABS 0.0    Chemistries   Recent Labs Lab 04/28/15 1458 04/29/15 0523  NA 143 141  K 4.6 4.1  CL 109 106  CO2 26 25  GLUCOSE 122* 322*  BUN 20 22*  CREATININE 2.30* 2.06*  CALCIUM 8.9 8.7*  AST 31  --   ALT 19  --   ALKPHOS 76  --   BILITOT 0.6  --    ------------------------------------------------------------------------------------------------------------------ estimated creatinine clearance is 28.8 mL/min (by C-G formula based on Cr of 2.06). ------------------------------------------------------------------------------------------------------------------ No results for input(s): HGBA1C in the last 72 hours. ------------------------------------------------------------------------------------------------------------------ No results for input(s): CHOL, HDL, LDLCALC, TRIG, CHOLHDL, LDLDIRECT in the last 72  hours. ------------------------------------------------------------------------------------------------------------------ No results for input(s): TSH, T4TOTAL, T3FREE, THYROIDAB in the last 72 hours.  Invalid input(s): FREET3 ------------------------------------------------------------------------------------------------------------------ No results for input(s): VITAMINB12, FOLATE, FERRITIN, TIBC, IRON, RETICCTPCT in the last 72 hours.  Coagulation profile No results for input(s): INR, PROTIME in the last 168 hours.  No results for input(s): DDIMER in the last 72 hours.  Cardiac Enzymes  Recent Labs Lab 04/28/15 1625  TROPONINI <0.03   ------------------------------------------------------------------------------------------------------------------ Invalid input(s): POCBNP   CBG:  Recent Labs Lab 04/28/15 2123 04/29/15 0801  GLUCAP 197* 109*       Studies: Dg Chest Portable 1 View  04/28/2015  CLINICAL DATA:  Productive cough for 3 days.  History of fever. EXAM: PORTABLE CHEST 1 VIEW COMPARISON:  09/25/2014 FINDINGS: Heart and mediastinum are within normal limits and stable. Lungs are clear without airspace disease. Question small calcified granulomas in the right lung. Trachea is midline. Multiple monitoring lines overlying the left side of the chest. IMPRESSION: No acute chest findings. Electronically Signed   By: Markus Daft M.D.   On: 04/28/2015 17:36  Lab Results  Component Value Date   HGBA1C 7.6* 08/30/2014   HGBA1C 7.6* 03/04/2013   HGBA1C 6.3* 02/01/2012   Lab Results  Component Value Date   LDLCALC 56 08/30/2014   CREATININE 2.06* 04/29/2015       Scheduled Meds: . apixaban  2.5 mg Oral BID  . aspirin EC  81 mg Oral Daily  . atorvastatin  40 mg Oral Daily  . azithromycin  500 mg Intravenous Q24H  . [START ON 04/30/2015] calcium-vitamin D  1 tablet Oral Q breakfast  . cefTRIAXone (ROCEPHIN)  IV  1 g Intravenous Q24H  . Chlorhexidine  Gluconate Cloth  6 each Topical Q0600  . cycloSPORINE  2 drop Both Eyes BID  . docusate sodium  100 mg Oral QHS  . insulin aspart  0-9 Units Subcutaneous 6 times per day  . insulin detemir  23 Units Subcutaneous QHS  . levalbuterol  1.25 mg Nebulization Q8H  . linagliptin  5 mg Oral Daily  . mupirocin ointment  1 application Nasal BID  . pantoprazole  40 mg Oral Daily  . tamsulosin  0.4 mg Oral QPC supper  . timolol  1 drop Left Eye Daily   Continuous Infusions:   Principal Problem:   SIRS (systemic inflammatory response syndrome) (HCC) Active Problems:   Hypotension   Arterial hypotension   UTI (urinary tract infection)   Bronchitis, chronic obstructive w acute bronchitis ([place])    Time spent: 45 minutes   [facility] Pager [phone]. If 7PM-7AM, please contact night-coverage at [email or web address], password [facility] 04/29/2015, 10:42 AM  LOS: 1 day

## 2015-04-30 DIAGNOSIS — J440 Chronic obstructive pulmonary disease with acute lower respiratory infection: Secondary | ICD-10-CM

## 2015-04-30 DIAGNOSIS — R6510 Systemic inflammatory response syndrome (SIRS) of non-infectious origin without acute organ dysfunction: Secondary | ICD-10-CM

## 2015-04-30 LAB — URINE CULTURE

## 2015-04-30 LAB — CBC
HCT: 36.5 % — ABNORMAL LOW (ref 39.0–52.0)
HEMOGLOBIN: 11.7 g/dL — AB (ref 13.0–17.0)
MCH: 29.8 pg (ref 26.0–34.0)
MCHC: 32.1 g/dL (ref 30.0–36.0)
MCV: 92.9 fL (ref 78.0–100.0)
Platelets: 156 10*3/uL (ref 150–400)
RBC: 3.93 MIL/uL — ABNORMAL LOW (ref 4.22–5.81)
RDW: 14.2 % (ref 11.5–15.5)
WBC: 8.8 10*3/uL (ref 4.0–10.5)

## 2015-04-30 LAB — COMPREHENSIVE METABOLIC PANEL
ALBUMIN: 2.8 g/dL — AB (ref 3.5–5.0)
ALK PHOS: 71 U/L (ref 38–126)
ALT: 23 U/L (ref 17–63)
ANION GAP: 6 (ref 5–15)
AST: 25 U/L (ref 15–41)
BUN: 20 mg/dL (ref 6–20)
CHLORIDE: 105 mmol/L (ref 101–111)
CO2: 26 mmol/L (ref 22–32)
Calcium: 8.2 mg/dL — ABNORMAL LOW (ref 8.9–10.3)
Creatinine, Ser: 1.68 mg/dL — ABNORMAL HIGH (ref 0.61–1.24)
GFR calc Af Amer: 40 mL/min — ABNORMAL LOW (ref >60)
GFR calc non Af Amer: 35 mL/min — ABNORMAL LOW (ref >60)
GLUCOSE: 103 mg/dL — AB (ref 65–99)
POTASSIUM: 3.9 mmol/L (ref 3.5–5.1)
SODIUM: 137 mmol/L (ref 135–145)
Total Bilirubin: 0.4 mg/dL (ref 0.3–1.2)
Total Protein: 5.7 g/dL — ABNORMAL LOW (ref 6.5–8.1)

## 2015-04-30 LAB — GLUCOSE, CAPILLARY
GLUCOSE-CAPILLARY: 87 mg/dL (ref 65–99)
Glucose-Capillary: 104 mg/dL — ABNORMAL HIGH (ref 65–99)
Glucose-Capillary: 111 mg/dL — ABNORMAL HIGH (ref 65–99)
Glucose-Capillary: 227 mg/dL — ABNORMAL HIGH (ref 65–99)

## 2015-04-30 LAB — HEMOGLOBIN A1C
Hgb A1c MFr Bld: 7.2 % — ABNORMAL HIGH (ref 4.8–5.6)
Mean Plasma Glucose: 160 mg/dL

## 2015-04-30 MED ORDER — LEVOFLOXACIN 750 MG PO TABS: 750.0000 mg | ORAL_TABLET | ORAL | Status: DC

## 2015-04-30 MED ORDER — NITROFURANTOIN MONOHYD MACRO 100 MG PO CAPS
100.0000 mg | ORAL_CAPSULE | Freq: Two times a day (BID) | ORAL | Status: DC
Start: 2015-04-30 — End: 2016-02-22

## 2015-04-30 MED ORDER — FOSFOMYCIN TROMETHAMINE 3 G PO PACK
3.0000 g | PACK | Freq: Once | ORAL | Status: DC
  Filled 2015-04-30: qty 3

## 2015-04-30 MED ORDER — GUAIFENESIN-CODEINE 100-10 MG/5ML PO SOLN: 5.0000 mL | ORAL | Status: DC | PRN

## 2015-04-30 MED ORDER — ALBUTEROL SULFATE HFA 108 (90 BASE) MCG/ACT IN AERS: 2.0000 | INHALATION_SPRAY | Freq: Four times a day (QID) | RESPIRATORY_TRACT | Status: DC | PRN

## 2015-04-30 MED ORDER — FLUTICASONE PROPIONATE 50 MCG/ACT NA SUSP: 2.0000 | Freq: Every day | NASAL | Status: DC

## 2015-04-30 NOTE — Progress Notes (Signed)
Nsg Discharge Note  Admit Date:  04/28/2015 Discharge date: 04/30/2015   Victor Wallace to be D/C'd Home per MD order.  AVS completed.  Copy for chart, and copy for patient signed, and dated. Patient/caregiver able to verbalize understanding.  Discharge Medication:   Medication List    TAKE these medications        albuterol 108 (90 BASE) MCG/ACT inhaler  Commonly known as:  PROVENTIL HFA;VENTOLIN HFA  Inhale 2 puffs into the lungs every 6 (six) hours as needed for wheezing or shortness of breath.     apixaban 2.5 MG Tabs tablet  Commonly known as:  ELIQUIS  Take 1 tablet (2.5 mg total) by mouth 2 (two) times daily.     aspirin EC 81 MG tablet  Take 1 tablet (81 mg total) by mouth daily.     atorvastatin 40 MG tablet  Commonly known as:  LIPITOR  Take 40 mg by mouth daily.     calcium-vitamin D 500-200 MG-UNIT tablet  Take 1 tablet by mouth daily.     cycloSPORINE 0.05 % ophthalmic emulsion  Commonly known as:  RESTASIS  Place 2 drops into both eyes 2 (two) times daily. Right eye in the morning and left eye in the evening     docusate sodium 100 MG capsule  Commonly known as:  COLACE  Take 100 mg by mouth at bedtime.     fish oil-omega-3 fatty acids 1000 MG capsule  Take 1 capsule (1 g total) by mouth daily. Stop taking if having diarrhea     fluticasone 50 MCG/ACT nasal spray  Commonly known as:  FLONASE  Place 2 sprays into both nostrils daily.     guaiFENesin-codeine 100-10 MG/5ML syrup  Take 5 mLs by mouth every 4 (four) hours as needed for cough.     HYDROcodone-acetaminophen 10-325 MG tablet  Commonly known as:  NORCO  Take 1-2 tablets by mouth every 4 (four) hours as needed for pain.     insulin aspart 100 UNIT/ML injection  Commonly known as:  novoLOG  Inject 0-15 Units into the skin 4 (four) times daily -  with meals and at bedtime. Sliding scale, 70-120-0 units, 121-150 2 units, 151-200 3 units, 201-250 3 units 251-300 8 units 301-350 11 units,  351-400 15 units, beyond call dr     insulin detemir 100 UNIT/ML injection  Commonly known as:  LEVEMIR  Inject 23 Units into the skin at bedtime.     isosorbide mononitrate 30 MG 24 hr tablet  Commonly known as:  IMDUR  Take 30 mg by mouth daily.     linagliptin 5 MG Tabs tablet  Commonly known as:  TRADJENTA  Take 1 tablet (5 mg total) by mouth daily.     metoprolol tartrate 25 MG tablet  Commonly known as:  LOPRESSOR  Take 1 tablet (25 mg total) by mouth 2 (two) times daily.     multivitamin with minerals tablet  Take 1 tablet by mouth daily.     PRESERVISION AREDS 2 Caps  Take 1 capsule by mouth 2 (two) times daily.     nitrofurantoin (macrocrystal-monohydrate) 100 MG capsule  Commonly known as:  MACROBID  Take 1 capsule (100 mg total) by mouth 2 (two) times daily.     nitroGLYCERIN 0.4 MG SL tablet  Commonly known as:  NITROSTAT  Place 1 tablet (0.4 mg total) under the tongue every 5 (five) minutes x 3 doses as needed for chest pain.  omeprazole 20 MG capsule  Commonly known as:  PRILOSEC  Take 20 mg by mouth daily.     polyethylene glycol packet  Commonly known as:  MIRALAX / GLYCOLAX  Take 17 g by mouth 2 (two) times daily as needed (for constipation).     tamsulosin 0.4 MG Caps capsule  Commonly known as:  FLOMAX  Take 1 capsule (0.4 mg total) by mouth daily after supper.     timolol 0.5 % ophthalmic solution  Commonly known as:  BETIMOL  Place 1 drop into the left eye daily.     vitamin B-6 500 MG tablet  Take 500 mg by mouth 2 (two) times daily.        Discharge Assessment: Filed Vitals:   04/30/15 1340  BP: 140/72  Pulse: 84  Temp: 98.3 F (36.8 C)  Resp: 19   Skin clean, dry and intact without evidence of skin break down, no evidence of skin tears noted. IV catheter discontinued intact. Site without signs and symptoms of complications - no redness or edema noted at insertion site, patient denies c/o pain - only slight tenderness at site.   Dressing with slight pressure applied.  D/c Instructions-Education: Discharge instructions given to patient/family with verbalized understanding. D/c education completed with patient/family including follow up instructions, medication list, d/c activities limitations if indicated, with other d/c instructions as indicated by MD - patient able to verbalize understanding, all questions fully answered. Patient instructed to return to ED, call 911, or call MD for any changes in condition.  Patient escorted via [place], and D/C home via private auto.  Salley Slaughter, RN 04/30/2015 3:36 PM

## 2015-04-30 NOTE — Evaluation (Signed)
Physical Therapy Evaluation and D/C Patient Details Name: Victor BARREIRO MRN: [id number] DOB: 1925-06-25 Today's Date: 04/30/2015   History of Present Illness  Pt admit with SIRS.has a past medical history of Hypertension; Hypercholesterolemia; GERD (gastroesophageal reflux disease); Transient global amnesia (2008 ); Type 2 diabetes mellitus with renal manifestations, controlled ([place]); Paroxysmal atrial fibrillation ([place]) (08/29/2014); Kidney disease, chronic, stage III (GFR 30-59 ml/min) (08/29/2014); Hyperlipidemia (12/02/2008); History of kidney stones; and CAD (coronary artery disease), native coronary artery.   Clinical Impression  Pt admitted with above diagnosis. Pt currently without significant functional limitations and is close to baseline per pt and wife.  Ambulating with RW with Modif I -S.  Pt will not benefit from skilled PT at this time.  Will sign off due to pt at baseline for mobility and ADL's.  Thanks.   Follow Up Recommendations No PT follow up;Supervision/Assistance - 24 hour    Equipment Recommendations  None recommended by PT    Recommendations for Other Services       Precautions / Restrictions Precautions Precautions: Fall Required Braces or Orthoses: Other Brace/Splint (wears hip brace right LE) Restrictions Weight Bearing Restrictions: No      Mobility  Bed Mobility Overal bed mobility: Independent                Transfers Overall transfer level: Independent                  Ambulation/Gait Ambulation/Gait assistance: Supervision Ambulation Distance (Feet): 300 Feet Assistive device: Rolling walker (2 wheeled) Gait Pattern/deviations: Step-through pattern;Decreased stride length;Decreased step length - right;Decreased step length - left;Trunk flexed;Wide base of support   Gait velocity interpretation: Below normal speed for age/gender General Gait Details: cues needed to stand tall and stay inside RW.  Tends to flex trunk as he  fatigues.  Wife aware of how to cue pt.   Stairs            Wheelchair Mobility    Modified Rankin (Stroke Patients Only)       Balance Overall balance assessment: Needs assistance         Standing balance support: Bilateral upper extremity supported;During functional activity Standing balance-Leahy Scale: Poor Standing balance comment: relies on UE support for sttanding balance.  Does well with RW with min challenges to balance.             High level balance activites: Direction changes;Turns;Sudden stops High Level Balance Comments: supervision for above.             Pertinent Vitals/Pain Pain Assessment: Faces Faces Pain Scale: Hurts a little bit Pain Location: right hip Pain Descriptors / Indicators: Sore Pain Intervention(s): Limited activity within patient's tolerance;Monitored during session;Repositioned  VSS    Home Living Family/patient expects to be discharged to:: Private residence Living Arrangements: Spouse/significant other;Children Available Help at Discharge: Family;Available 24 hours/day Type of Home: House Home Access: Stairs to enter Entrance Stairs-Rails: None Entrance Stairs-Number of Steps: 1 Home Layout: One level Home Equipment: Walker - 2 wheels;Walker - 4 wheels;Cane - single point;Bedside commode;Shower seat;Wheelchair - manual Additional Comments: 3 wheeled walker too and uses scooter in stores    Prior Function Level of Independence: Independent with assistive device(s)               Hand Dominance   Dominant Hand: Right    Extremity/Trunk Assessment   Upper Extremity Assessment: Defer to OT evaluation           Lower Extremity Assessment: Generalized  weakness      Cervical / Trunk Assessment: Normal  Communication   Communication: No difficulties  Cognition Arousal/Alertness: Awake/alert Behavior During Therapy: WFL for tasks assessed/performed Overall Cognitive Status: Within Functional Limits  for tasks assessed                      General Comments      Exercises General Exercises - Lower Extremity Ankle Circles/Pumps: AROM;Both;10 reps;Seated Long Arc Quad: AROM;Both;10 reps;Seated      Assessment/Plan    PT Assessment Patent does not need any further PT services  PT Diagnosis Generalized weakness   PT Problem List    PT Treatment Interventions     PT Goals (Current goals can be found in the Care Plan section) Acute Rehab PT Goals Patient Stated Goal: to go home PT Goal Formulation: All assessment and education complete, DC therapy    Frequency     Barriers to discharge        Co-evaluation               End of Session Equipment Utilized During Treatment: Gait belt Activity Tolerance: Patient limited by fatigue Patient left: in bed;with call bell/phone within reach;with family/visitor present Nurse Communication: Mobility status         Time: 3762-8315 PT Time Calculation (min) (ACUTE ONLY): 23 min   Charges:   PT Evaluation $Initial PT Evaluation Tier I: 1 Procedure PT Treatments $Gait Training: 8-22 mins   PT G CodesDenice Paradise 2015-05-08, 3:41 PM [employer] Acute Rehabilitation [phone] (pager)

## 2015-04-30 NOTE — Discharge Summary (Deleted)
Physician Discharge Summary  COREON SIMKINS MRN: [id number] DOB/AGE: Jan 13, 1925 81 y.o.  PCP: Haywood Pao, MD   Admit date: 04/28/2015 Discharge date: 04/30/2015  Discharge Diagnoses:   Principal Problem:   SIRS (systemic inflammatory response syndrome) (HCC) Active Problems:   Hypotension   Arterial hypotension   UTI (urinary tract infection)   Bronchitis, chronic obstructive w acute bronchitis (HCC)    Follow-up recommendations Follow-up with PCP in 3-5 days , including all  additional recommended appointments as below Follow-up CBC, CMP in 3-5 days      Medication List    TAKE these medications        apixaban 2.5 MG Tabs tablet  Commonly known as:  ELIQUIS  Take 1 tablet (2.5 mg total) by mouth 2 (two) times daily.     aspirin EC 81 MG tablet  Take 1 tablet (81 mg total) by mouth daily.     atorvastatin 40 MG tablet  Commonly known as:  LIPITOR  Take 40 mg by mouth daily.     calcium-vitamin D 500-200 MG-UNIT tablet  Take 1 tablet by mouth daily.     cycloSPORINE 0.05 % ophthalmic emulsion  Commonly known as:  RESTASIS  Place 2 drops into both eyes 2 (two) times daily. Right eye in the morning and left eye in the evening     docusate sodium 100 MG capsule  Commonly known as:  COLACE  Take 100 mg by mouth at bedtime.     fish oil-omega-3 fatty acids 1000 MG capsule  Take 1 capsule (1 g total) by mouth daily. Stop taking if having diarrhea     guaiFENesin-codeine 100-10 MG/5ML syrup  Take 5 mLs by mouth every 4 (four) hours as needed for cough.     HYDROcodone-acetaminophen 10-325 MG tablet  Commonly known as:  NORCO  Take 1-2 tablets by mouth every 4 (four) hours as needed for pain.     insulin aspart 100 UNIT/ML injection  Commonly known as:  novoLOG  Inject 0-15 Units into the skin 4 (four) times daily -  with meals and at bedtime. Sliding scale, 70-120-0 units, 121-150 2 units, 151-200 3 units, 201-250 3 units 251-300 8 units 301-350  11 units, 351-400 15 units, beyond call dr     insulin detemir 100 UNIT/ML injection  Commonly known as:  LEVEMIR  Inject 23 Units into the skin at bedtime.     isosorbide mononitrate 30 MG 24 hr tablet  Commonly known as:  IMDUR  Take 30 mg by mouth daily.     levofloxacin 750 MG tablet  Commonly known as:  LEVAQUIN  Take 1 tablet (750 mg total) by mouth every other day.     linagliptin 5 MG Tabs tablet  Commonly known as:  TRADJENTA  Take 1 tablet (5 mg total) by mouth daily.     metoprolol tartrate 25 MG tablet  Commonly known as:  LOPRESSOR  Take 1 tablet (25 mg total) by mouth 2 (two) times daily.     multivitamin with minerals tablet  Take 1 tablet by mouth daily.     PRESERVISION AREDS 2 Caps  Take 1 capsule by mouth 2 (two) times daily.     nitroGLYCERIN 0.4 MG SL tablet  Commonly known as:  NITROSTAT  Place 1 tablet (0.4 mg total) under the tongue every 5 (five) minutes x 3 doses as needed for chest pain.     omeprazole 20 MG capsule  Commonly known as:  PRILOSEC  Take 20  mg by mouth daily.     polyethylene glycol packet  Commonly known as:  MIRALAX / GLYCOLAX  Take 17 g by mouth 2 (two) times daily as needed (for constipation).     tamsulosin 0.4 MG Caps capsule  Commonly known as:  FLOMAX  Take 1 capsule (0.4 mg total) by mouth daily after supper.     timolol 0.5 % ophthalmic solution  Commonly known as:  BETIMOL  Place 1 drop into the left eye daily.     vitamin B-6 500 MG tablet  Take 500 mg by mouth 2 (two) times daily.         Discharge Condition:stable   Discharge Instructions       Discharge Instructions    Diet - low sodium heart healthy    Complete by:  As directed      Increase activity slowly    Complete by:  As directed            No Known Allergies    Disposition: 01-Home or Self Care   Consults:  None    Significant Diagnostic Studies:  Dg Chest Portable 1 View  04/28/2015  CLINICAL DATA:  Productive cough  for 3 days.  History of fever. EXAM: PORTABLE CHEST 1 VIEW COMPARISON:  09/25/2014 FINDINGS: Heart and mediastinum are within normal limits and stable. Lungs are clear without airspace disease. Question small calcified granulomas in the right lung. Trachea is midline. Multiple monitoring lines overlying the left side of the chest. IMPRESSION: No acute chest findings. Electronically Signed   By: Markus Daft M.D.   On: 04/28/2015 17:36        Filed Weights   04/28/15 1450 04/28/15 2102 04/29/15 1112  Weight: 97.523 kg (215 lb) 95.709 kg (211 lb) 94.5 kg (208 lb 5.4 oz)     Microbiology: Recent Results (from the past 240 hour(s))  Culture, blood (routine x 2)     Status: None (Preliminary result)   Collection Time: 04/28/15  2:58 PM  Result Value Ref Range Status   Specimen Description BLOOD RIGHT ARM  Final   Special Requests BOTTLES DRAWN AEROBIC AND ANAEROBIC 6CCBLUE 5CCRED  Final   Culture NO GROWTH < 24 HOURS  Final   Report Status PENDING  Incomplete  Culture, blood (routine x 2)     Status: None (Preliminary result)   Collection Time: 04/28/15  4:25 PM  Result Value Ref Range Status   Specimen Description BLOOD RIGHT HAND  Final   Special Requests   Final    BOTTLES DRAWN AEROBIC AND ANAEROBIC 10CC BLUE, 5CC RED   Culture NO GROWTH < 24 HOURS  Final   Report Status PENDING  Incomplete  MRSA PCR Screening     Status: Abnormal   Collection Time: 04/28/15  9:30 PM  Result Value Ref Range Status   MRSA by PCR POSITIVE (A) NEGATIVE Final    Comment:        The GeneXpert MRSA Assay (FDA approved for NASAL specimens only), is one component of a comprehensive MRSA colonization surveillance program. It is not intended to diagnose MRSA infection nor to guide or monitor treatment for MRSA infections. RESULT CALLED TO, READ BACK BY AND VERIFIED WITH: AMANDA'@[email or web address]'  04/13/15 MKELLY        Blood Culture    Component Value Date/Time   SDES BLOOD RIGHT HAND 04/28/2015 1625    SPECREQUEST  04/28/2015 1625    BOTTLES DRAWN AEROBIC AND ANAEROBIC 10CC BLUE, 5CC RED   CULT  NO GROWTH < 24 HOURS 04/28/2015 1625   REPTSTATUS PENDING 04/28/2015 1625      Labs: Results for orders placed or performed during the hospital encounter of 04/28/15 (from the past 48 hour(s))  Comprehensive metabolic panel     Status: Abnormal   Collection Time: 04/28/15  2:58 PM  Result Value Ref Range   Sodium 143 135 - 145 mmol/L   Potassium 4.6 3.5 - 5.1 mmol/L   Chloride 109 101 - 111 mmol/L   CO2 26 22 - 32 mmol/L   Glucose, Bld 122 (H) 65 - 99 mg/dL   BUN 20 6 - 20 mg/dL   Creatinine, Ser 2.30 (H) 0.61 - 1.24 mg/dL   Calcium 8.9 8.9 - 10.3 mg/dL   Total Protein 5.9 (L) 6.5 - 8.1 g/dL   Albumin 3.0 (L) 3.5 - 5.0 g/dL   AST 31 15 - 41 U/L   ALT 19 17 - 63 U/L   Alkaline Phosphatase 76 38 - 126 U/L   Total Bilirubin 0.6 0.3 - 1.2 mg/dL   GFR calc non Af Amer 24 (L) >60 mL/min   GFR calc Af Amer 28 (L) >60 mL/min    Comment: (NOTE) The eGFR has been calculated using the CKD EPI equation. This calculation has not been validated in all clinical situations. eGFR's persistently <60 mL/min signify possible Chronic Kidney Disease.    Anion gap 8 5 - 15  Culture, blood (routine x 2)     Status: None (Preliminary result)   Collection Time: 04/28/15  2:58 PM  Result Value Ref Range   Specimen Description BLOOD RIGHT ARM    Special Requests BOTTLES DRAWN AEROBIC AND ANAEROBIC 6CCBLUE 5CCRED    Culture NO GROWTH < 24 HOURS    Report Status PENDING   I-Stat CG4 Lactic Acid, ED  (not at  [facility])     Status: Abnormal   Collection Time: 04/28/15  3:17 PM  Result Value Ref Range   Lactic Acid, Venous 2.06 (HH) 0.5 - 2.0 mmol/L   Comment NOTIFIED PHYSICIAN   Culture, blood (routine x 2)     Status: None (Preliminary result)   Collection Time: 04/28/15  4:25 PM  Result Value Ref Range   Specimen Description BLOOD RIGHT HAND    Special Requests      BOTTLES DRAWN AEROBIC AND ANAEROBIC  10CC BLUE, 5CC RED   Culture NO GROWTH < 24 HOURS    Report Status PENDING   Troponin I     Status: None   Collection Time: 04/28/15  4:25 PM  Result Value Ref Range   Troponin I <0.03 <0.031 ng/mL    Comment:        NO INDICATION OF MYOCARDIAL INJURY.   CBC with Differential/Platelet     Status: Abnormal   Collection Time: 04/28/15  6:47 PM  Result Value Ref Range   WBC 10.6 (H) 4.0 - 10.5 K/uL   RBC 4.06 (L) 4.22 - 5.81 MIL/uL   Hemoglobin 12.1 (L) 13.0 - 17.0 g/dL   HCT 38.0 (L) 39.0 - 52.0 %   MCV 93.6 78.0 - 100.0 fL   MCH 29.8 26.0 - 34.0 pg   MCHC 31.8 30.0 - 36.0 g/dL   RDW 14.4 11.5 - 15.5 %   Platelets 156 150 - 400 K/uL   Neutrophils Relative % 84 %   Neutro Abs 8.9 (H) 1.7 - 7.7 K/uL   Lymphocytes Relative 8 %   Lymphs Abs 0.9 0.7 - 4.0 K/uL  Monocytes Relative 7 %   Monocytes Absolute 0.8 0.1 - 1.0 K/uL   Eosinophils Relative 1 %   Eosinophils Absolute 0.1 0.0 - 0.7 K/uL   Basophils Relative 0 %   Basophils Absolute 0.0 0.0 - 0.1 K/uL  I-Stat CG4 Lactic Acid, ED  (not at  [facility])     Status: None   Collection Time: 04/28/15  6:52 PM  Result Value Ref Range   Lactic Acid, Venous 1.21 0.5 - 2.0 mmol/L  Glucose, capillary     Status: Abnormal   Collection Time: 04/28/15  9:23 PM  Result Value Ref Range   Glucose-Capillary 197 (H) 65 - 99 mg/dL  MRSA PCR Screening     Status: Abnormal   Collection Time: 04/28/15  9:30 PM  Result Value Ref Range   MRSA by PCR POSITIVE (A) NEGATIVE    Comment:        The GeneXpert MRSA Assay (FDA approved for NASAL specimens only), is one component of a comprehensive MRSA colonization surveillance program. It is not intended to diagnose MRSA infection nor to guide or monitor treatment for MRSA infections. RESULT CALLED TO, READ BACK BY AND VERIFIED WITH: AMANDA'[email or web address]'  04/13/15 MKELLY   Urinalysis, Routine w reflex microscopic (not at [facility])     Status: Abnormal   Collection Time: 04/29/15  5:04 AM  Result Value Ref Range    Color, Urine YELLOW YELLOW   APPearance TURBID (A) CLEAR   Specific Gravity, Urine 1.026 1.005 - 1.030   pH 6.0 5.0 - 8.0   Glucose, UA >1000 (A) NEGATIVE mg/dL   Hgb urine dipstick SMALL (A) NEGATIVE   Bilirubin Urine NEGATIVE NEGATIVE   Ketones, ur NEGATIVE NEGATIVE mg/dL   Protein, ur NEGATIVE NEGATIVE mg/dL   Urobilinogen, UA 0.2 0.0 - 1.0 mg/dL   Nitrite NEGATIVE NEGATIVE   Leukocytes, UA LARGE (A) NEGATIVE  Urine microscopic-add on     Status: None   Collection Time: 04/29/15  5:04 AM  Result Value Ref Range   Squamous Epithelial / LPF RARE RARE   WBC, UA TOO NUMEROUS TO COUNT <3 WBC/hpf   RBC / HPF 3-6 <3 RBC/hpf   Bacteria, UA RARE RARE  Basic metabolic panel     Status: Abnormal   Collection Time: 04/29/15  5:23 AM  Result Value Ref Range   Sodium 141 135 - 145 mmol/L   Potassium 4.1 3.5 - 5.1 mmol/L   Chloride 106 101 - 111 mmol/L   CO2 25 22 - 32 mmol/L   Glucose, Bld 322 (H) 65 - 99 mg/dL   BUN 22 (H) 6 - 20 mg/dL   Creatinine, Ser 2.06 (H) 0.61 - 1.24 mg/dL   Calcium 8.7 (L) 8.9 - 10.3 mg/dL   GFR calc non Af Amer 27 (L) >60 mL/min   GFR calc Af Amer 31 (L) >60 mL/min    Comment: (NOTE) The eGFR has been calculated using the CKD EPI equation. This calculation has not been validated in all clinical situations. eGFR's persistently <60 mL/min signify possible Chronic Kidney Disease.    Anion gap 10 5 - 15  Glucose, capillary     Status: Abnormal   Collection Time: 04/29/15  8:01 AM  Result Value Ref Range   Glucose-Capillary 109 (H) 65 - 99 mg/dL  Hemoglobin A1c     Status: Abnormal   Collection Time: 04/29/15  8:50 AM  Result Value Ref Range   Hgb A1c MFr Bld 7.2 (H) 4.8 - 5.6 %  Comment: (NOTE)         Pre-diabetes: 5.7 - 6.4         Diabetes: >6.4         Glycemic control for adults with diabetes: <7.0    Mean Plasma Glucose 160 mg/dL    Comment: (NOTE) Performed At: [facility] [place], [place] [id number] Victor Romp MD [id number]   Glucose, capillary     Status: Abnormal   Collection Time: 04/29/15 12:20 PM  Result Value Ref Range   Glucose-Capillary 184 (H) 65 - 99 mg/dL  Glucose, capillary     Status: Abnormal   Collection Time: 04/29/15  5:56 PM  Result Value Ref Range   Glucose-Capillary 183 (H) 65 - 99 mg/dL  Glucose, capillary     Status: Abnormal   Collection Time: 04/29/15 10:35 PM  Result Value Ref Range   Glucose-Capillary 282 (H) 65 - 99 mg/dL  Glucose, capillary     Status: Abnormal   Collection Time: 04/30/15 12:09 AM  Result Value Ref Range   Glucose-Capillary 227 (H) 65 - 99 mg/dL  Glucose, capillary     Status: Abnormal   Collection Time: 04/30/15  4:15 AM  Result Value Ref Range   Glucose-Capillary 111 (H) 65 - 99 mg/dL  CBC     Status: Abnormal   Collection Time: 04/30/15  6:19 AM  Result Value Ref Range   WBC 8.8 4.0 - 10.5 K/uL   RBC 3.93 (L) 4.22 - 5.81 MIL/uL   Hemoglobin 11.7 (L) 13.0 - 17.0 g/dL   HCT 36.5 (L) 39.0 - 52.0 %   MCV 92.9 78.0 - 100.0 fL   MCH 29.8 26.0 - 34.0 pg   MCHC 32.1 30.0 - 36.0 g/dL   RDW 14.2 11.5 - 15.5 %   Platelets 156 150 - 400 K/uL  Glucose, capillary     Status: None   Collection Time: 04/30/15  7:42 AM  Result Value Ref Range   Glucose-Capillary 87 65 - 99 mg/dL     Lipid Panel     Component Value Date/Time   CHOL 105 08/30/2014 0052   TRIG 49 08/30/2014 0052   HDL 39* 08/30/2014 0052   CHOLHDL 2.7 08/30/2014 0052   VLDL 10 08/30/2014 0052   LDLCALC 56 08/30/2014 0052     Lab Results  Component Value Date   HGBA1C 7.2* 04/29/2015   HGBA1C 7.6* 08/30/2014   HGBA1C 7.6* 03/04/2013     Lab Results  Component Value Date   LDLCALC 56 08/30/2014   CREATININE 2.06* 04/29/2015     HPI :81 year old male with history of CAD, HTN, paroxysmal afib, DMII.  - He was brought from PCP office after he was found to be fatigue, disoriented and generally weak.  - This happened suddenly  while visiting PCP  clinic for cough with his daughter. He was a little clammy as well.  - No slurred speech, focal weakness or focal sensory complaints.  - Cough has been going on for 3 days, dry and sometimes productive of clear sputum.  - No fever, chills, chest pain or dyspnea. He had mild URI symptoms with sore throat a few days ago.  - No N/V/D/C/abd pain, wounds, lines, catheter, trauma, or dysuria. He had 3 BM today and the last one was soft.  - No dizziness or lightheadedness. No headache. No changes in his medications recently.    HOSPITAL COURSE:   SIRS (systemic inflammatory response syndrome) (HCC)-likely in  the setting of UTI, improving with IV fluids, blood pressure stable, lactic acid is improved from 2.06> 1.21  Acute on Chronic kidney disease stage III-baseline creatinine 1.8-2.0, creatinine 2.3 upon admission, improved with IV fluids   Hypotension-secondary to dehydration, improved with IV fluids   UTI (urinary tract infection)-initially started on Rocephin 2 days, urine culture no growth so far. History of BPH, will restart Flomax. I called [facility] and discussed with Dr. Alyson Ingles, urologist on call. Patient'S previous urine cultures have all shown multiple bacterial morphologies. Patient will be given 1 dose of fosfomycin prior to discharge. He will also be continued on nitrofurantoin for UTI prophylaxis, as patient states that this is the only medication that has ever worked for him.  acute Bronchitis, chronic obstructive w acute bronchitis (HCC)-started patient on Xopenex nebulizer treatments, symptoms mostly upper airway, started on Robitussin-DM, antibiotics, symptoms improved. Patient to continue with albuterol and Flonase.  Diabetes mellitus patient on Levemir and NovoLog, continue Lantus and sliding scale insulin, hold tradjenta   Paroxysmal atrial fibrillation Chads2VASC score 5, on eliquis     Discharge Exam:  Blood pressure 135/65, pulse 77, temperature 97.6  F (36.4 C), temperature source Oral, resp. rate 18, height '6\' 2"'  (1.88 m), weight 94.5 kg (208 lb 5.4 oz), SpO2 99 %. GENERAL : Well developed, well nourished, alert and cooperative, and appears to be in no acute distress. HEAD: normocephalic. EYES: PERRL, EOMI. Marland Kitchen EARS: hearing grossly intact. NOSE: No nasal discharge. THROAT: Oral cavity and pharynx normal. No inflammation, swelling, exudate, or lesions.  NECK: Neck supple. CARDIAC: Normal S1 and S2. No S3, S4 or murmurs. Rhythm is regular. There is no peripheral edema, LUNGS: Clear to auscultation and percussion without rales, rhonchi, wheezing or diminished breath sounds. ABDOMEN: Positive bowel sounds. Soft, nondistended, nontender. No guarding or rebound. No masses. EXTREMITIES: No significant deformity or joint abnormality. No edema.No varicosities. NEUROLOGICAL: The mental examination revealed the patient was oriented to person, place, and time.CN II-XII intact. Strength and sensation symmetric and intact throughout.     Follow-up Information    Follow up with Haywood Pao, MD. Schedule an appointment as soon as possible for a visit in 3 days.   Specialty:  Internal Medicine   Contact information:   [place] [address] [phone]       Signed: Reyne Dumas 04/30/2015, 8:11 AM        Time spent >45 mins

## 2015-04-30 NOTE — Discharge Summary (Signed)
Physician Discharge Summary  Victor Wallace MRN: [id number] DOB/AGE: 79-Jan-1926 79 y.o.  PCP: Haywood Pao, MD   Admit date: 04/28/2015 Discharge date: 04/30/2015  Discharge Diagnoses:   Principal Problem:   SIRS (systemic inflammatory response syndrome) (HCC) Active Problems:   Hypotension   Arterial hypotension   UTI (urinary tract infection)   Bronchitis, chronic obstructive w acute bronchitis (HCC)    Follow-up recommendations Follow-up with PCP in 3-5 days , including all  additional recommended appointments as below Follow-up CBC, CMP in 3-5 days      Medication List    TAKE these medications        albuterol 108 (90 BASE) MCG/ACT inhaler  Commonly known as:  PROVENTIL HFA;VENTOLIN HFA  Inhale 2 puffs into the lungs every 6 (six) hours as needed for wheezing or shortness of breath.     apixaban 2.5 MG Tabs tablet  Commonly known as:  ELIQUIS  Take 1 tablet (2.5 mg total) by mouth 2 (two) times daily.     aspirin EC 81 MG tablet  Take 1 tablet (81 mg total) by mouth daily.     atorvastatin 40 MG tablet  Commonly known as:  LIPITOR  Take 40 mg by mouth daily.     calcium-vitamin D 500-200 MG-UNIT tablet  Take 1 tablet by mouth daily.     cycloSPORINE 0.05 % ophthalmic emulsion  Commonly known as:  RESTASIS  Place 2 drops into both eyes 2 (two) times daily. Right eye in the morning and left eye in the evening     docusate sodium 100 MG capsule  Commonly known as:  COLACE  Take 100 mg by mouth at bedtime.     fish oil-omega-3 fatty acids 1000 MG capsule  Take 1 capsule (1 g total) by mouth daily. Stop taking if having diarrhea     fluticasone 50 MCG/ACT nasal spray  Commonly known as:  FLONASE  Place 2 sprays into both nostrils daily.     guaiFENesin-codeine 100-10 MG/5ML syrup  Take 5 mLs by mouth every 4 (four) hours as needed for cough.     HYDROcodone-acetaminophen 10-325 MG tablet  Commonly known as:  NORCO  Take 1-2 tablets by  mouth every 4 (four) hours as needed for pain.     insulin aspart 100 UNIT/ML injection  Commonly known as:  novoLOG  Inject 0-15 Units into the skin 4 (four) times daily -  with meals and at bedtime. Sliding scale, 70-120-0 units, 121-150 2 units, 151-200 3 units, 201-250 3 units 251-300 8 units 301-350 11 units, 351-400 15 units, beyond call dr     insulin detemir 100 UNIT/ML injection  Commonly known as:  LEVEMIR  Inject 23 Units into the skin at bedtime.     isosorbide mononitrate 30 MG 24 hr tablet  Commonly known as:  IMDUR  Take 30 mg by mouth daily.     linagliptin 5 MG Tabs tablet  Commonly known as:  TRADJENTA  Take 1 tablet (5 mg total) by mouth daily.     metoprolol tartrate 25 MG tablet  Commonly known as:  LOPRESSOR  Take 1 tablet (25 mg total) by mouth 2 (two) times daily.     multivitamin with minerals tablet  Take 1 tablet by mouth daily.     PRESERVISION AREDS 2 Caps  Take 1 capsule by mouth 2 (two) times daily.     nitrofurantoin (macrocrystal-monohydrate) 100 MG capsule  Commonly known as:  MACROBID  Take 1 capsule (100  mg total) by mouth 2 (two) times daily.     nitroGLYCERIN 0.4 MG SL tablet  Commonly known as:  NITROSTAT  Place 1 tablet (0.4 mg total) under the tongue every 5 (five) minutes x 3 doses as needed for chest pain.     omeprazole 20 MG capsule  Commonly known as:  PRILOSEC  Take 20 mg by mouth daily.     polyethylene glycol packet  Commonly known as:  MIRALAX / GLYCOLAX  Take 17 g by mouth 2 (two) times daily as needed (for constipation).     tamsulosin 0.4 MG Caps capsule  Commonly known as:  FLOMAX  Take 1 capsule (0.4 mg total) by mouth daily after supper.     timolol 0.5 % ophthalmic solution  Commonly known as:  BETIMOL  Place 1 drop into the left eye daily.     vitamin B-6 500 MG tablet  Take 500 mg by mouth 2 (two) times daily.         Discharge Condition:stable   Discharge Instructions   Discharge Instructions     Diet - low sodium heart healthy    Complete by:  As directed      Increase activity slowly    Complete by:  As directed            No Known Allergies    Disposition: 01-Home or Self Care   Consults:  None    Significant Diagnostic Studies:  Dg Chest Portable 1 View  04/28/2015  CLINICAL DATA:  Productive cough for 3 days.  History of fever. EXAM: PORTABLE CHEST 1 VIEW COMPARISON:  09/25/2014 FINDINGS: Heart and mediastinum are within normal limits and stable. Lungs are clear without airspace disease. Question small calcified granulomas in the right lung. Trachea is midline. Multiple monitoring lines overlying the left side of the chest. IMPRESSION: No acute chest findings. Electronically Signed   By: Markus Daft M.D.   On: 04/28/2015 17:36        Filed Weights   04/28/15 1450 04/28/15 2102 04/29/15 1112  Weight: 97.523 kg (215 lb) 95.709 kg (211 lb) 94.5 kg (208 lb 5.4 oz)     Microbiology: Recent Results (from the past 240 hour(s))  Culture, blood (routine x 2)     Status: None (Preliminary result)   Collection Time: 04/28/15  2:58 PM  Result Value Ref Range Status   Specimen Description BLOOD RIGHT ARM  Final   Special Requests BOTTLES DRAWN AEROBIC AND ANAEROBIC 6CCBLUE 5CCRED  Final   Culture NO GROWTH < 24 HOURS  Final   Report Status PENDING  Incomplete  Culture, blood (routine x 2)     Status: None (Preliminary result)   Collection Time: 04/28/15  4:25 PM  Result Value Ref Range Status   Specimen Description BLOOD RIGHT HAND  Final   Special Requests   Final    BOTTLES DRAWN AEROBIC AND ANAEROBIC 10CC BLUE, 5CC RED   Culture NO GROWTH < 24 HOURS  Final   Report Status PENDING  Incomplete  MRSA PCR Screening     Status: Abnormal   Collection Time: 04/28/15  9:30 PM  Result Value Ref Range Status   MRSA by PCR POSITIVE (A) NEGATIVE Final    Comment:        The GeneXpert MRSA Assay (FDA approved for NASAL specimens only), is one component of  a comprehensive MRSA colonization surveillance program. It is not intended to diagnose MRSA infection nor to guide or monitor treatment for  MRSA infections. RESULT CALLED TO, READ BACK BY AND VERIFIED WITH: [email or web address]  04/13/15 MKELLY        Blood Culture    Component Value Date/Time   SDES BLOOD RIGHT HAND 04/28/2015 1625   SPECREQUEST  04/28/2015 1625    BOTTLES DRAWN AEROBIC AND ANAEROBIC 10CC BLUE, 5CC RED   CULT NO GROWTH < 24 HOURS 04/28/2015 1625   REPTSTATUS PENDING 04/28/2015 1625      Labs: Results for orders placed or performed during the hospital encounter of 04/28/15 (from the past 48 hour(s))  Comprehensive metabolic panel     Status: Abnormal   Collection Time: 04/28/15  2:58 PM  Result Value Ref Range   Sodium 143 135 - 145 mmol/L   Potassium 4.6 3.5 - 5.1 mmol/L   Chloride 109 101 - 111 mmol/L   CO2 26 22 - 32 mmol/L   Glucose, Bld 122 (H) 65 - 99 mg/dL   BUN 20 6 - 20 mg/dL   Creatinine, Ser 2.30 (H) 0.61 - 1.24 mg/dL   Calcium 8.9 8.9 - 10.3 mg/dL   Total Protein 5.9 (L) 6.5 - 8.1 g/dL   Albumin 3.0 (L) 3.5 - 5.0 g/dL   AST 31 15 - 41 U/L   ALT 19 17 - 63 U/L   Alkaline Phosphatase 76 38 - 126 U/L   Total Bilirubin 0.6 0.3 - 1.2 mg/dL   GFR calc non Af Amer 24 (L) >60 mL/min   GFR calc Af Amer 28 (L) >60 mL/min    Comment: (NOTE) The eGFR has been calculated using the CKD EPI equation. This calculation has not been validated in all clinical situations. eGFR's persistently <60 mL/min signify possible Chronic Kidney Disease.    Anion gap 8 5 - 15  Culture, blood (routine x 2)     Status: None (Preliminary result)   Collection Time: 04/28/15  2:58 PM  Result Value Ref Range   Specimen Description BLOOD RIGHT ARM    Special Requests BOTTLES DRAWN AEROBIC AND ANAEROBIC 6CCBLUE 5CCRED    Culture NO GROWTH < 24 HOURS    Report Status PENDING   I-Stat CG4 Lactic Acid, ED  (not at  [facility])     Status: Abnormal   Collection Time: 04/28/15  3:17 PM   Result Value Ref Range   Lactic Acid, Venous 2.06 (HH) 0.5 - 2.0 mmol/L   Comment NOTIFIED PHYSICIAN   Culture, blood (routine x 2)     Status: None (Preliminary result)   Collection Time: 04/28/15  4:25 PM  Result Value Ref Range   Specimen Description BLOOD RIGHT HAND    Special Requests      BOTTLES DRAWN AEROBIC AND ANAEROBIC 10CC BLUE, 5CC RED   Culture NO GROWTH < 24 HOURS    Report Status PENDING   Troponin I     Status: None   Collection Time: 04/28/15  4:25 PM  Result Value Ref Range   Troponin I <0.03 <0.031 ng/mL    Comment:        NO INDICATION OF MYOCARDIAL INJURY.   CBC with Differential/Platelet     Status: Abnormal   Collection Time: 04/28/15  6:47 PM  Result Value Ref Range   WBC 10.6 (H) 4.0 - 10.5 K/uL   RBC 4.06 (L) 4.22 - 5.81 MIL/uL   Hemoglobin 12.1 (L) 13.0 - 17.0 g/dL   HCT 38.0 (L) 39.0 - 52.0 %   MCV 93.6 78.0 - 100.0 fL   MCH 29.8 26.0 - 34.0  pg   MCHC 31.8 30.0 - 36.0 g/dL   RDW 14.4 11.5 - 15.5 %   Platelets 156 150 - 400 K/uL   Neutrophils Relative % 84 %   Neutro Abs 8.9 (H) 1.7 - 7.7 K/uL   Lymphocytes Relative 8 %   Lymphs Abs 0.9 0.7 - 4.0 K/uL   Monocytes Relative 7 %   Monocytes Absolute 0.8 0.1 - 1.0 K/uL   Eosinophils Relative 1 %   Eosinophils Absolute 0.1 0.0 - 0.7 K/uL   Basophils Relative 0 %   Basophils Absolute 0.0 0.0 - 0.1 K/uL  I-Stat CG4 Lactic Acid, ED  (not at  [facility])     Status: None   Collection Time: 04/28/15  6:52 PM  Result Value Ref Range   Lactic Acid, Venous 1.21 0.5 - 2.0 mmol/L  Glucose, capillary     Status: Abnormal   Collection Time: 04/28/15  9:23 PM  Result Value Ref Range   Glucose-Capillary 197 (H) 65 - 99 mg/dL  MRSA PCR Screening     Status: Abnormal   Collection Time: 04/28/15  9:30 PM  Result Value Ref Range   MRSA by PCR POSITIVE (A) NEGATIVE    Comment:        The GeneXpert MRSA Assay (FDA approved for NASAL specimens only), is one component of a comprehensive MRSA  colonization surveillance program. It is not intended to diagnose MRSA infection nor to guide or monitor treatment for MRSA infections. RESULT CALLED TO, READ BACK BY AND VERIFIED WITH: [email or web address]_[email or web address]  04/13/15 MKELLY   Urinalysis, Routine w reflex microscopic (not at [facility])     Status: Abnormal   Collection Time: 04/29/15  5:04 AM  Result Value Ref Range   Color, Urine YELLOW YELLOW   APPearance TURBID (A) CLEAR   Specific Gravity, Urine 1.026 1.005 - 1.030   pH 6.0 5.0 - 8.0   Glucose, UA >1000 (A) NEGATIVE mg/dL   Hgb urine dipstick SMALL (A) NEGATIVE   Bilirubin Urine NEGATIVE NEGATIVE   Ketones, ur NEGATIVE NEGATIVE mg/dL   Protein, ur NEGATIVE NEGATIVE mg/dL   Urobilinogen, UA 0.2 0.0 - 1.0 mg/dL   Nitrite NEGATIVE NEGATIVE   Leukocytes, UA LARGE (A) NEGATIVE  Urine microscopic-add on     Status: None   Collection Time: 04/29/15  5:04 AM  Result Value Ref Range   Squamous Epithelial / LPF RARE RARE   WBC, UA TOO NUMEROUS TO COUNT <3 WBC/hpf   RBC / HPF 3-6 <3 RBC/hpf   Bacteria, UA RARE RARE  Basic metabolic panel     Status: Abnormal   Collection Time: 04/29/15  5:23 AM  Result Value Ref Range   Sodium 141 135 - 145 mmol/L   Potassium 4.1 3.5 - 5.1 mmol/L   Chloride 106 101 - 111 mmol/L   CO2 25 22 - 32 mmol/L   Glucose, Bld 322 (H) 65 - 99 mg/dL   BUN 22 (H) 6 - 20 mg/dL   Creatinine, Ser 2.06 (H) 0.61 - 1.24 mg/dL   Calcium 8.7 (L) 8.9 - 10.3 mg/dL   GFR calc non Af Amer 27 (L) >60 mL/min   GFR calc Af Amer 31 (L) >60 mL/min    Comment: (NOTE) The eGFR has been calculated using the CKD EPI equation. This calculation has not been validated in all clinical situations. eGFR's persistently <60 mL/min signify possible Chronic Kidney Disease.    Anion gap 10 5 - 15  Glucose, capillary     Status: Abnormal  Collection Time: 04/29/15  8:01 AM  Result Value Ref Range   Glucose-Capillary 109 (H) 65 - 99 mg/dL  Hemoglobin A1c     Status: Abnormal   Collection Time:  04/29/15  8:50 AM  Result Value Ref Range   Hgb A1c MFr Bld 7.2 (H) 4.8 - 5.6 %    Comment: (NOTE)         Pre-diabetes: 5.7 - 6.4         Diabetes: >6.4         Glycemic control for adults with diabetes: <7.0    Mean Plasma Glucose 160 mg/dL    Comment: (NOTE) Performed At: [facility] [place], [place] [id number] Lindon Romp MD EX:[id number]   Glucose, capillary     Status: Abnormal   Collection Time: 04/29/15 12:20 PM  Result Value Ref Range   Glucose-Capillary 184 (H) 65 - 99 mg/dL  Glucose, capillary     Status: Abnormal   Collection Time: 04/29/15  5:56 PM  Result Value Ref Range   Glucose-Capillary 183 (H) 65 - 99 mg/dL  Glucose, capillary     Status: Abnormal   Collection Time: 04/29/15 10:35 PM  Result Value Ref Range   Glucose-Capillary 282 (H) 65 - 99 mg/dL  Glucose, capillary     Status: Abnormal   Collection Time: 04/30/15 12:09 AM  Result Value Ref Range   Glucose-Capillary 227 (H) 65 - 99 mg/dL  Glucose, capillary     Status: Abnormal   Collection Time: 04/30/15  4:15 AM  Result Value Ref Range   Glucose-Capillary 111 (H) 65 - 99 mg/dL  CBC     Status: Abnormal   Collection Time: 04/30/15  6:19 AM  Result Value Ref Range   WBC 8.8 4.0 - 10.5 K/uL   RBC 3.93 (L) 4.22 - 5.81 MIL/uL   Hemoglobin 11.7 (L) 13.0 - 17.0 g/dL   HCT 36.5 (L) 39.0 - 52.0 %   MCV 92.9 78.0 - 100.0 fL   MCH 29.8 26.0 - 34.0 pg   MCHC 32.1 30.0 - 36.0 g/dL   RDW 14.2 11.5 - 15.5 %   Platelets 156 150 - 400 K/uL  Comprehensive metabolic panel     Status: Abnormal   Collection Time: 04/30/15  6:19 AM  Result Value Ref Range   Sodium 137 135 - 145 mmol/L   Potassium 3.9 3.5 - 5.1 mmol/L   Chloride 105 101 - 111 mmol/L   CO2 26 22 - 32 mmol/L   Glucose, Bld 103 (H) 65 - 99 mg/dL   BUN 20 6 - 20 mg/dL   Creatinine, Ser 1.68 (H) 0.61 - 1.24 mg/dL   Calcium 8.2 (L) 8.9 - 10.3 mg/dL   Total Protein 5.7 (L) 6.5 - 8.1 g/dL   Albumin 2.8 (L) 3.5 - 5.0 g/dL    AST 25 15 - 41 U/L   ALT 23 17 - 63 U/L   Alkaline Phosphatase 71 38 - 126 U/L   Total Bilirubin 0.4 0.3 - 1.2 mg/dL   GFR calc non Af Amer 35 (L) >60 mL/min   GFR calc Af Amer 40 (L) >60 mL/min    Comment: (NOTE) The eGFR has been calculated using the CKD EPI equation. This calculation has not been validated in all clinical situations. eGFR's persistently <60 mL/min signify possible Chronic Kidney Disease.    Anion gap 6 5 - 15  Glucose, capillary     Status: None   Collection Time: 04/30/15  7:42 AM  Result Value Ref Range   Glucose-Capillary 87 65 - 99 mg/dL     Lipid Panel     Component Value Date/Time   CHOL 105 08/30/2014 0052   TRIG 49 08/30/2014 0052   HDL 39* 08/30/2014 0052   CHOLHDL 2.7 08/30/2014 0052   VLDL 10 08/30/2014 0052   LDLCALC 56 08/30/2014 0052     Lab Results  Component Value Date   HGBA1C 7.2* 04/29/2015   HGBA1C 7.6* 08/30/2014   HGBA1C 7.6* 03/04/2013     Lab Results  Component Value Date   LDLCALC 56 08/30/2014   CREATININE 1.68* 04/30/2015     HPI :81 year old male with history of CAD, HTN, paroxysmal afib, DMII.  - He was brought from PCP office after he was found to be fatigue, disoriented and generally weak.  - This happened suddenly  while visiting PCP clinic for cough with his daughter. He was a little clammy as well.  - No slurred speech, focal weakness or focal sensory complaints.  - Cough has been going on for 3 days, dry and sometimes productive of clear sputum.  - No fever, chills, chest pain or dyspnea. He had mild URI symptoms with sore throat a few days ago.  - No N/V/D/C/abd pain, wounds, lines, catheter, trauma, or dysuria. He had 3 BM today and the last one was soft.  - No dizziness or lightheadedness. No headache. No changes in his medications recently.    HOSPITAL COURSE:   SIRS (systemic inflammatory response syndrome) (HCC)-likely in the setting of UTI, improving with IV fluids, blood pressure  stable, lactic acid is improved from 2.06> 1.21  Acute on Chronic kidney disease stage III-baseline creatinine 1.8-2.0, creatinine 2.3 upon admission, improved with IV fluids   Hypotension-secondary to dehydration, improved with IV fluids   UTI (urinary tract infection)-initially started on Rocephin 2 days, urine culture no growth so far. History of BPH, will restart Flomax. I called [facility] and discussed with Dr. Alyson Ingles, urologist on call. Patient'S previous urine cultures have all shown multiple bacterial morphologies. Patient will be given 1 dose of fosfomycin prior to discharge. He will also be continued on nitrofurantoin for UTI prophylaxis, as patient states that this is the only medication that has ever worked for him.  acute Bronchitis, chronic obstructive w acute bronchitis (HCC)-started patient on Xopenex nebulizer treatments, symptoms mostly upper airway, started on Robitussin-DM, antibiotics, symptoms improved. Patient to continue with albuterol and Flonase.  Diabetes mellitus patient on Levemir and NovoLog, continue Lantus and sliding scale insulin, hold tradjenta   Paroxysmal atrial fibrillation Chads2VASC score 5, on eliquis     Discharge Exam:  Blood pressure 135/65, pulse 77, temperature 97.6 F (36.4 C), temperature source Oral, resp. rate 18, height 6' 2" (1.88 m), weight 94.5 kg (208 lb 5.4 oz), SpO2 99 %. GENERAL : Well developed, well nourished, alert and cooperative, and appears to be in no acute distress. HEAD: normocephalic. EYES: PERRL, EOMI. Marland Kitchen EARS: hearing grossly intact. NOSE: No nasal discharge. THROAT: Oral cavity and pharynx normal. No inflammation, swelling, exudate, or lesions.  NECK: Neck supple. CARDIAC: Normal S1 and S2. No S3, S4 or murmurs. Rhythm is regular. There is no peripheral edema, LUNGS: Clear to auscultation and percussion without rales, rhonchi, wheezing or diminished breath sounds. ABDOMEN: Positive bowel sounds. Soft,  nondistended, nontender. No guarding or rebound. No masses. EXTREMITIES: No significant deformity or joint abnormality. No edema.No varicosities. NEUROLOGICAL: The mental examination revealed the patient was oriented  to person, place, and time.CN II-XII intact. Strength and sensation symmetric and intact throughout.     Follow-up Information    Follow up with Haywood Pao, MD. Schedule an appointment as soon as possible for a visit in 3 days.   Specialty:  Internal Medicine   Contact information:   [place] [address] [phone]       Signed: Reyne Dumas 04/30/2015, 9:41 AM        Time spent >45 mins

## 2015-05-03 LAB — CULTURE, BLOOD (ROUTINE X 2)
CULTURE: NO GROWTH
CULTURE: NO GROWTH

## 2015-05-04 DIAGNOSIS — E785 Hyperlipidemia, unspecified: Secondary | ICD-10-CM | POA: Diagnosis not present

## 2015-05-06 ENCOUNTER — Inpatient Hospital Stay: Payer: Medicare Other

## 2015-05-12 DIAGNOSIS — N401 Enlarged prostate with lower urinary tract symptoms: Secondary | ICD-10-CM | POA: Diagnosis not present

## 2015-05-12 DIAGNOSIS — N3941 Urge incontinence: Secondary | ICD-10-CM | POA: Diagnosis not present

## 2015-05-12 DIAGNOSIS — R3915 Urgency of urination: Secondary | ICD-10-CM | POA: Diagnosis not present

## 2015-05-12 DIAGNOSIS — R3912 Poor urinary stream: Secondary | ICD-10-CM | POA: Diagnosis not present

## 2015-05-29 DIAGNOSIS — N401 Enlarged prostate with lower urinary tract symptoms: Secondary | ICD-10-CM | POA: Diagnosis not present

## 2015-05-29 DIAGNOSIS — N390 Urinary tract infection, site not specified: Secondary | ICD-10-CM | POA: Diagnosis not present

## 2015-05-29 DIAGNOSIS — E785 Hyperlipidemia, unspecified: Secondary | ICD-10-CM | POA: Diagnosis not present

## 2015-05-29 DIAGNOSIS — I2510 Atherosclerotic heart disease of native coronary artery without angina pectoris: Secondary | ICD-10-CM | POA: Diagnosis not present

## 2015-05-29 DIAGNOSIS — Z6828 Body mass index (BMI) 28.0-28.9, adult: Secondary | ICD-10-CM | POA: Diagnosis not present

## 2015-05-29 DIAGNOSIS — I480 Paroxysmal atrial fibrillation: Secondary | ICD-10-CM | POA: Diagnosis not present

## 2015-05-29 DIAGNOSIS — N359 Urethral stricture, unspecified: Secondary | ICD-10-CM | POA: Diagnosis not present

## 2015-05-29 DIAGNOSIS — N184 Chronic kidney disease, stage 4 (severe): Secondary | ICD-10-CM | POA: Diagnosis not present

## 2015-05-29 DIAGNOSIS — R6510 Systemic inflammatory response syndrome (SIRS) of non-infectious origin without acute organ dysfunction: Secondary | ICD-10-CM | POA: Diagnosis not present

## 2015-05-29 DIAGNOSIS — E1129 Type 2 diabetes mellitus with other diabetic kidney complication: Secondary | ICD-10-CM | POA: Diagnosis not present

## 2015-06-01 ENCOUNTER — Other Ambulatory Visit: Payer: Self-pay | Admitting: Cardiology

## 2015-06-13 DIAGNOSIS — N390 Urinary tract infection, site not specified: Secondary | ICD-10-CM | POA: Diagnosis not present

## 2015-06-13 DIAGNOSIS — E784 Other hyperlipidemia: Secondary | ICD-10-CM | POA: Diagnosis not present

## 2015-06-13 DIAGNOSIS — R8290 Unspecified abnormal findings in urine: Secondary | ICD-10-CM | POA: Diagnosis not present

## 2015-06-13 DIAGNOSIS — E1129 Type 2 diabetes mellitus with other diabetic kidney complication: Secondary | ICD-10-CM | POA: Diagnosis not present

## 2015-06-13 DIAGNOSIS — Z125 Encounter for screening for malignant neoplasm of prostate: Secondary | ICD-10-CM | POA: Diagnosis not present

## 2015-06-13 DIAGNOSIS — I2510 Atherosclerotic heart disease of native coronary artery without angina pectoris: Secondary | ICD-10-CM | POA: Diagnosis not present

## 2015-06-20 DIAGNOSIS — Z6828 Body mass index (BMI) 28.0-28.9, adult: Secondary | ICD-10-CM | POA: Diagnosis not present

## 2015-06-20 DIAGNOSIS — N359 Urethral stricture, unspecified: Secondary | ICD-10-CM | POA: Diagnosis not present

## 2015-06-20 DIAGNOSIS — R5381 Other malaise: Secondary | ICD-10-CM | POA: Diagnosis not present

## 2015-06-20 DIAGNOSIS — Z7901 Long term (current) use of anticoagulants: Secondary | ICD-10-CM | POA: Diagnosis not present

## 2015-06-20 DIAGNOSIS — I480 Paroxysmal atrial fibrillation: Secondary | ICD-10-CM | POA: Diagnosis not present

## 2015-06-20 DIAGNOSIS — E1129 Type 2 diabetes mellitus with other diabetic kidney complication: Secondary | ICD-10-CM | POA: Diagnosis not present

## 2015-06-20 DIAGNOSIS — N200 Calculus of kidney: Secondary | ICD-10-CM | POA: Diagnosis not present

## 2015-06-20 DIAGNOSIS — Z1389 Encounter for screening for other disorder: Secondary | ICD-10-CM | POA: Diagnosis not present

## 2015-06-20 DIAGNOSIS — N401 Enlarged prostate with lower urinary tract symptoms: Secondary | ICD-10-CM | POA: Diagnosis not present

## 2015-06-20 DIAGNOSIS — Z0000 Encounter for general adult medical examination without abnormal findings: Secondary | ICD-10-CM | POA: Diagnosis not present

## 2015-06-20 DIAGNOSIS — N184 Chronic kidney disease, stage 4 (severe): Secondary | ICD-10-CM | POA: Diagnosis not present

## 2015-06-20 DIAGNOSIS — I209 Angina pectoris, unspecified: Secondary | ICD-10-CM | POA: Diagnosis not present

## 2015-06-30 ENCOUNTER — Other Ambulatory Visit: Payer: Self-pay

## 2015-07-07 DIAGNOSIS — H52223 Regular astigmatism, bilateral: Secondary | ICD-10-CM | POA: Diagnosis not present

## 2015-07-07 DIAGNOSIS — H353131 Nonexudative age-related macular degeneration, bilateral, early dry stage: Secondary | ICD-10-CM | POA: Diagnosis not present

## 2015-07-07 DIAGNOSIS — H401121 Primary open-angle glaucoma, left eye, mild stage: Secondary | ICD-10-CM | POA: Diagnosis not present

## 2015-07-07 DIAGNOSIS — H11153 Pinguecula, bilateral: Secondary | ICD-10-CM | POA: Diagnosis not present

## 2015-07-07 DIAGNOSIS — H33303 Unspecified retinal break, bilateral: Secondary | ICD-10-CM | POA: Diagnosis not present

## 2015-07-07 DIAGNOSIS — H18413 Arcus senilis, bilateral: Secondary | ICD-10-CM | POA: Diagnosis not present

## 2015-07-07 DIAGNOSIS — H04123 Dry eye syndrome of bilateral lacrimal glands: Secondary | ICD-10-CM | POA: Diagnosis not present

## 2015-07-07 DIAGNOSIS — Z961 Presence of intraocular lens: Secondary | ICD-10-CM | POA: Diagnosis not present

## 2015-07-07 DIAGNOSIS — Z9849 Cataract extraction status, unspecified eye: Secondary | ICD-10-CM | POA: Diagnosis not present

## 2015-07-07 DIAGNOSIS — H33323 Round hole, bilateral: Secondary | ICD-10-CM | POA: Diagnosis not present

## 2015-07-15 ENCOUNTER — Other Ambulatory Visit: Payer: Self-pay

## 2015-07-15 MED ORDER — METOPROLOL TARTRATE 25 MG PO TABS: 25.0000 mg | ORAL_TABLET | Freq: Two times a day (BID) | ORAL | Status: DC

## 2015-08-08 IMAGING — CR DG ABDOMEN ACUTE W/ 1V CHEST
4 series · 4 of 4 positions shown · non-contrast
Comparison: CT abdomen pelvis 03/03/2013

CLINICAL DATA: Worsening abdominal pain

ACUTE ABDOMEN SERIES (ABDOMEN 2 VIEW & CHEST 1 VIEW)

[w abdomen decub *]
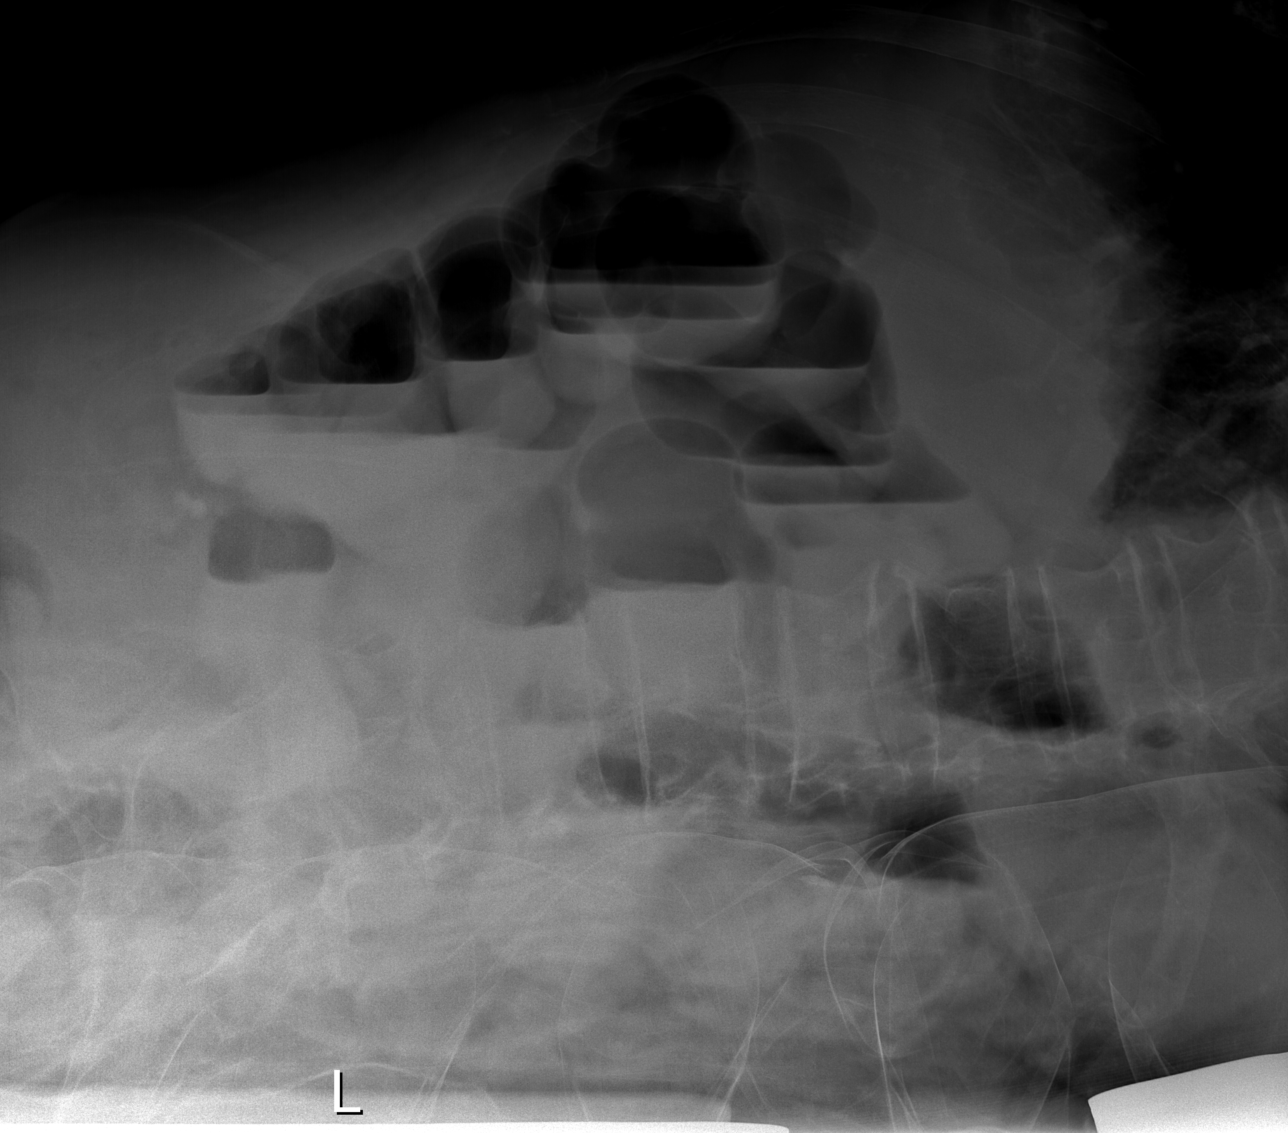

[view not recorded (1 of 3)]
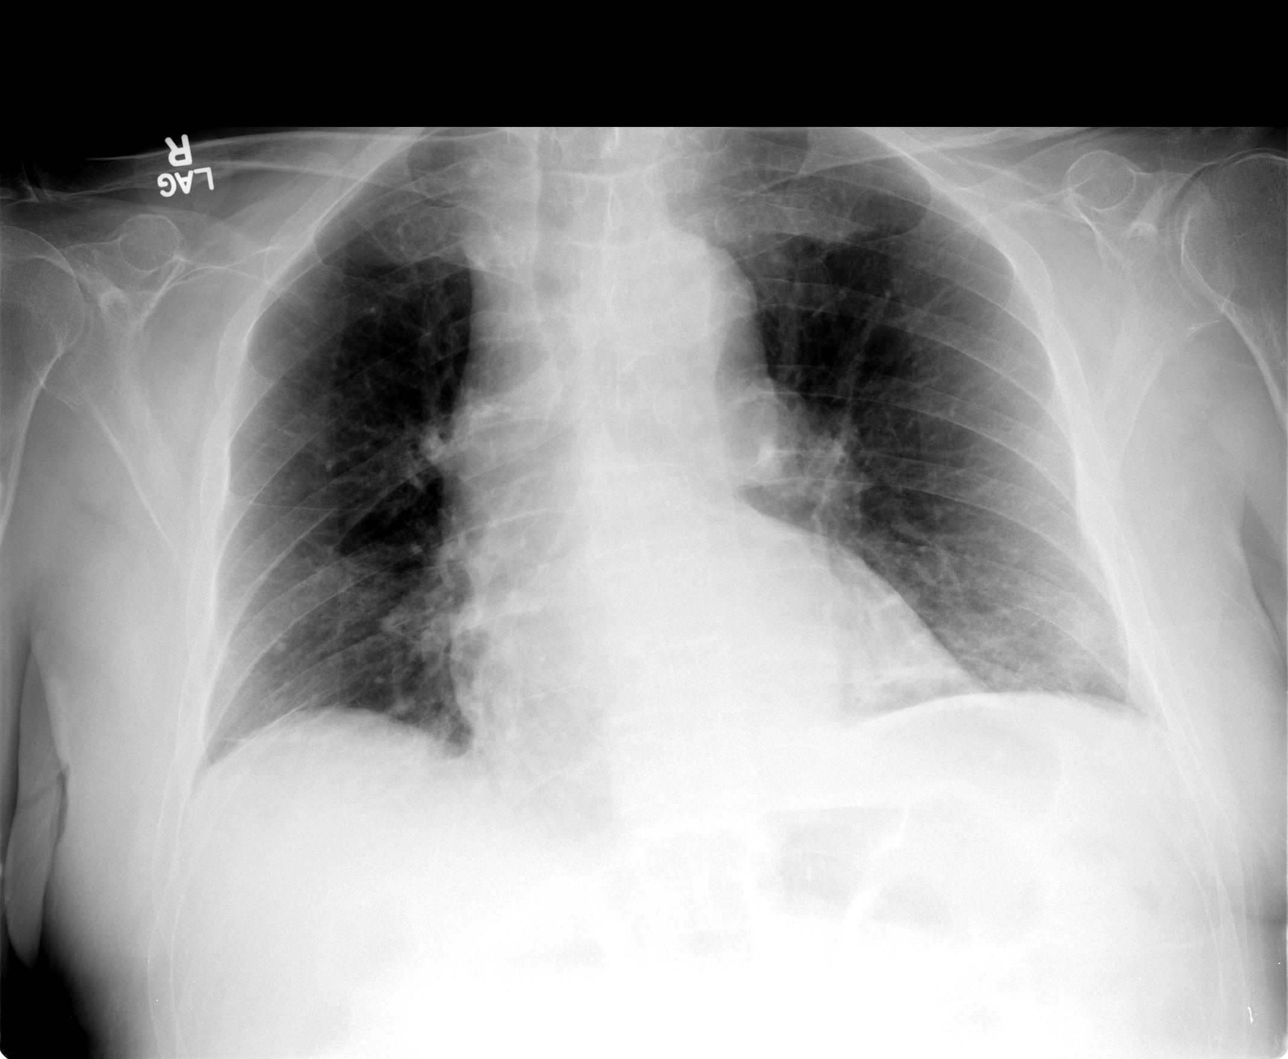

[view not recorded (2 of 3)]
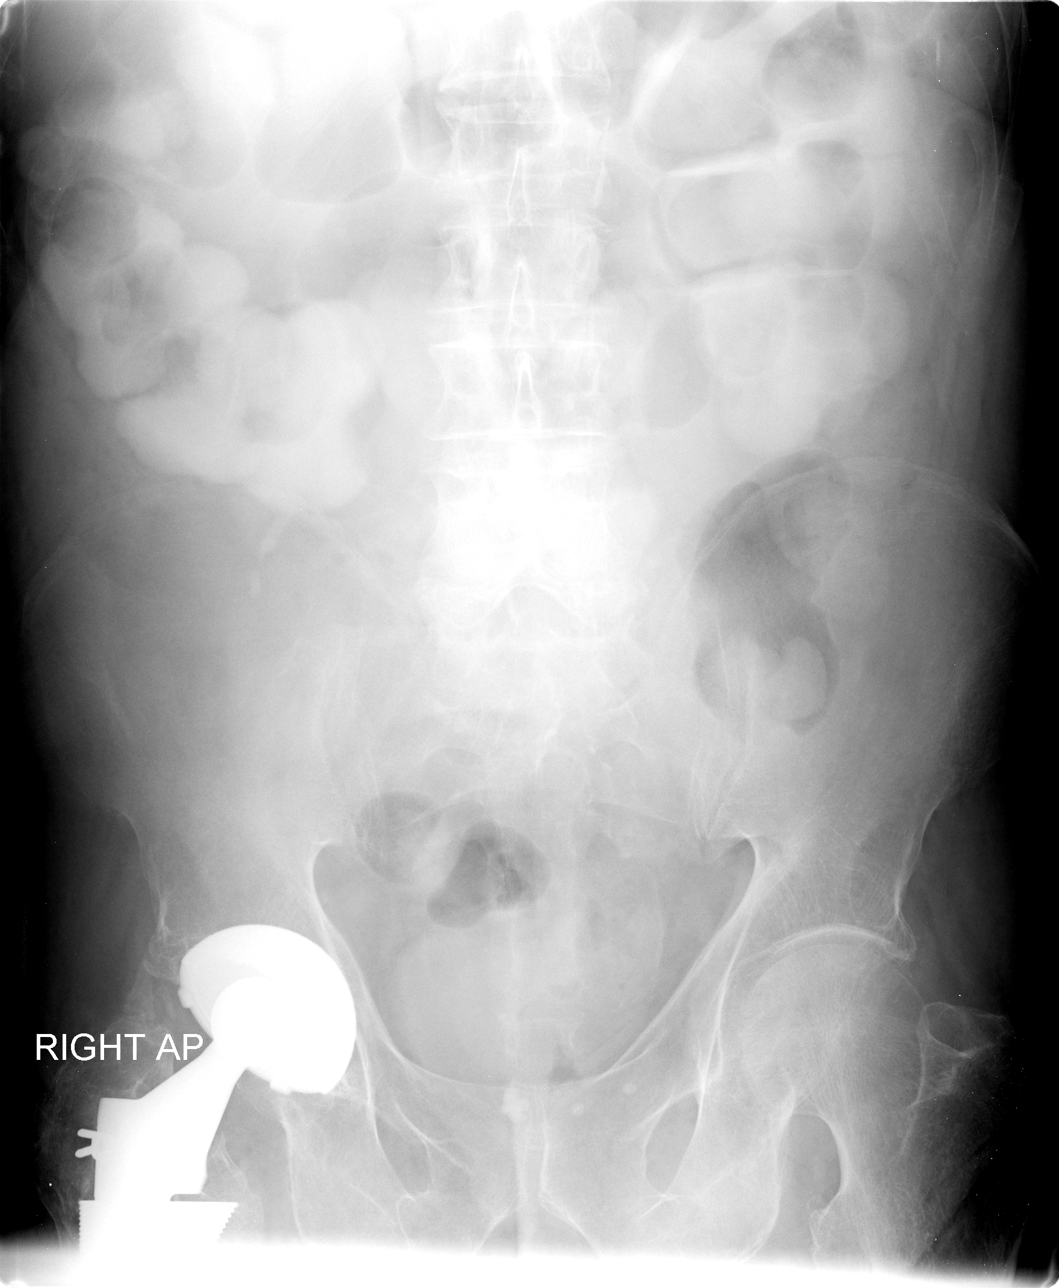

[view not recorded (3 of 3)]
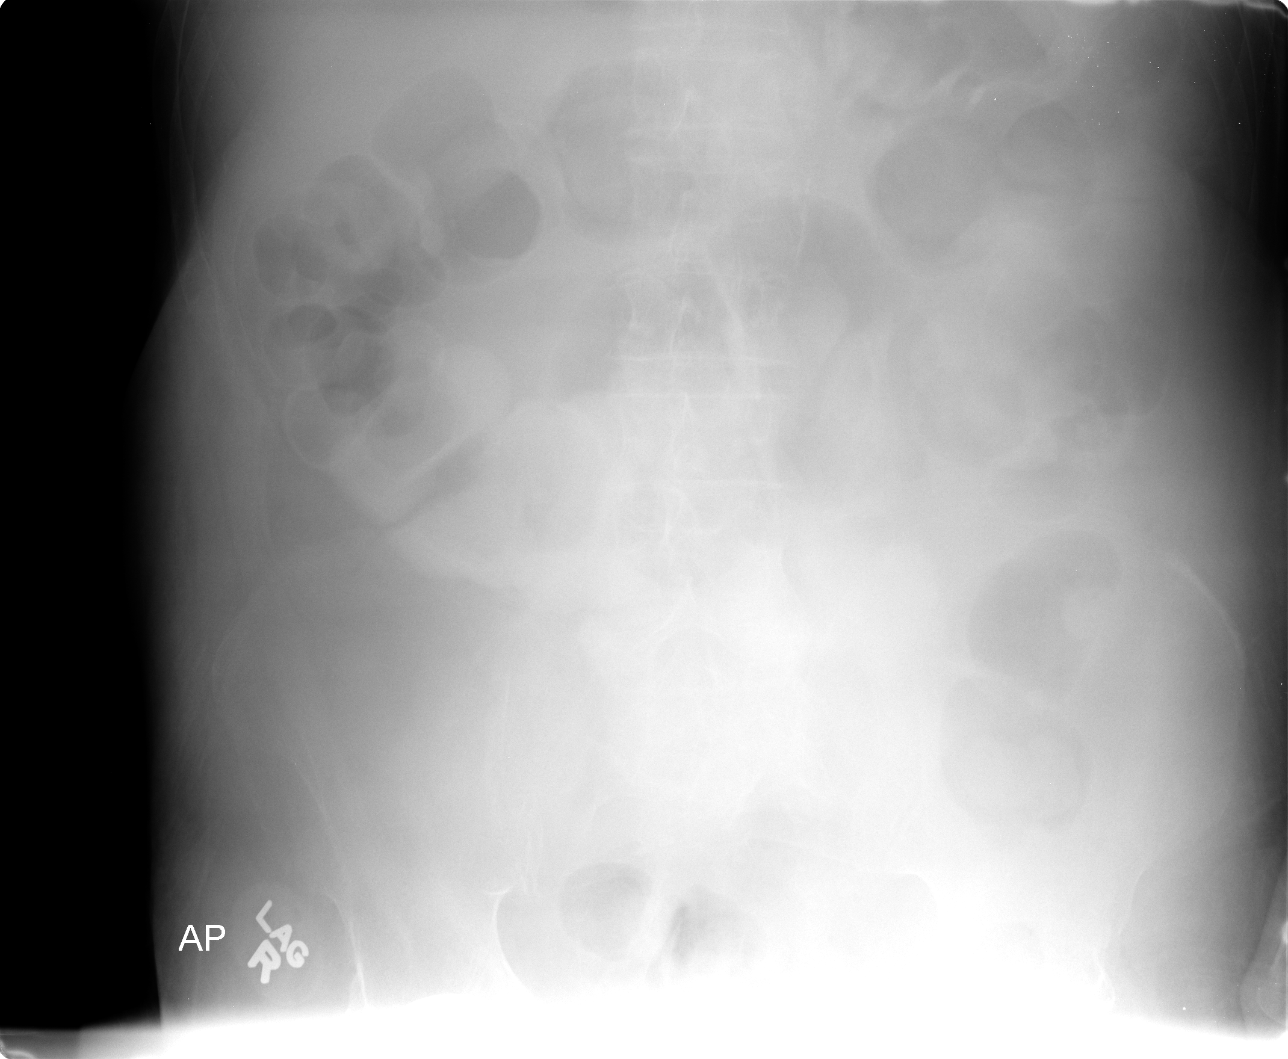

[4 of 4 positions shown; findings below may reference images not displayed]

FINDINGS: Mild cardiomegaly.  Normal pulmonary vascularity.  Lungs
are clear.  There is no contrast material in the colon from the CT
examination 2 days ago.  There is mild distention of the colon
without evidence of obstruction.  Oral contrast is seen in the
rectum.  No dilated loops of small bowel.  No radiopaque urinary
tract calculi are seen, but evaluation is markedly limited by
colonic contrast.  No free intraperitoneal air is seen.
IMPRESSION: 1.  Nonobstructive bowel gas pattern.  There is mild distention of
the colon diffusely suggesting mild colonic ileus.
2.  Oral contrast material is seen in the colon from recent CT.
3.  The proximal left ureteral stone seen on the CT stone study
03/03/2013 is not visible on today's radiographs, and may be
obscured by bowel contrast.  Findings discussed with the patient's
nurse, Perloz, [DATE] p.m. 03/05/2013.

## 2015-08-09 IMAGING — US US RENAL
1 series · 14 of 25 positions shown · non-contrast
Comparison: CT scan 03/03/2013.

CLINICAL DATA: Follow-up left-sided hydronephrosis.

RENAL/URINARY TRACT ULTRASOUND COMPLETE

[Series 1: us renal · 0.24mm/px · 14 of 66 slices shown]
[im 1/66]
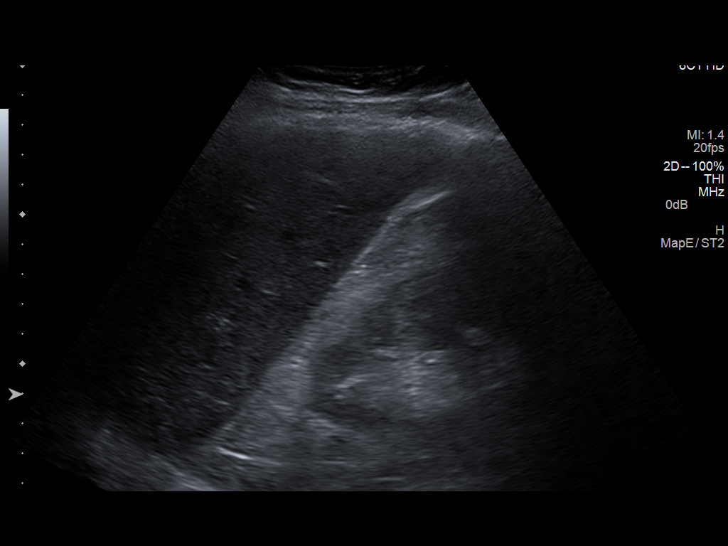
[im 6/66]
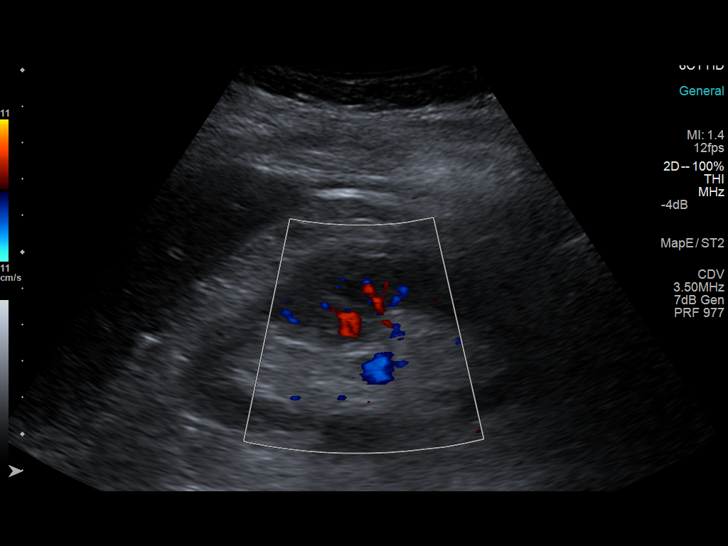
[im 11/66]
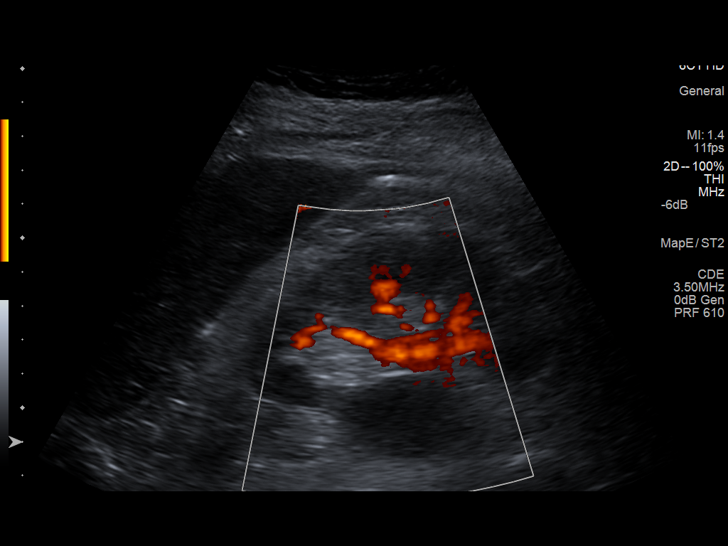
[im 17/66]
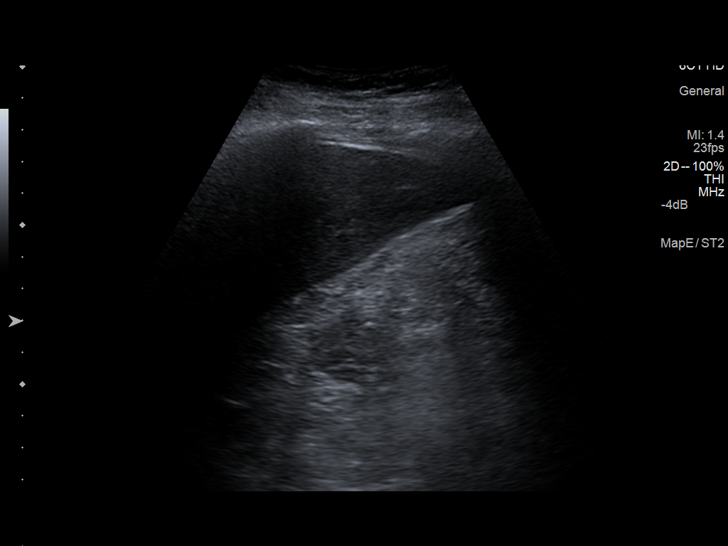
[im 22/66]
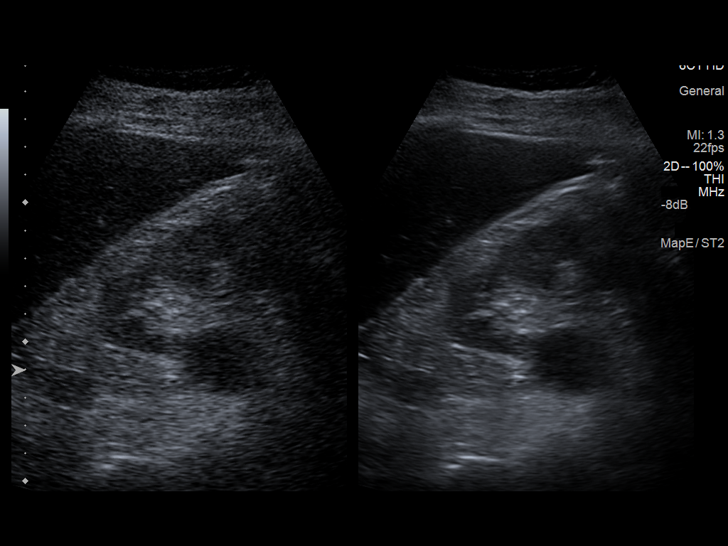
[im 25/66]
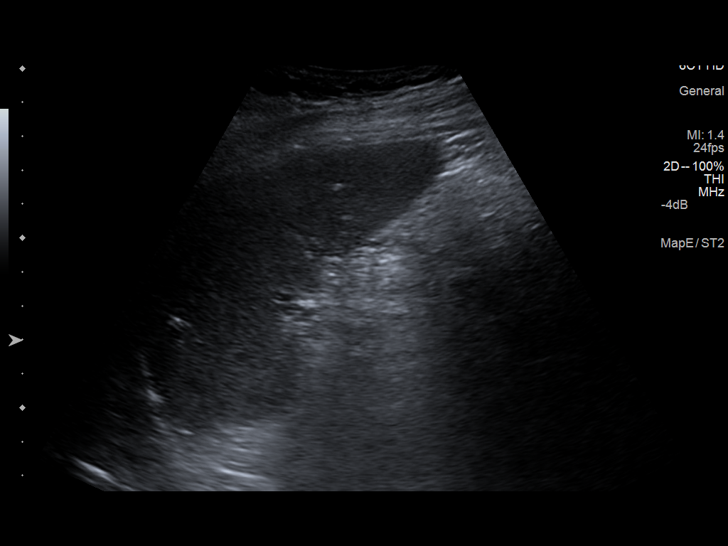
[im 30/66]
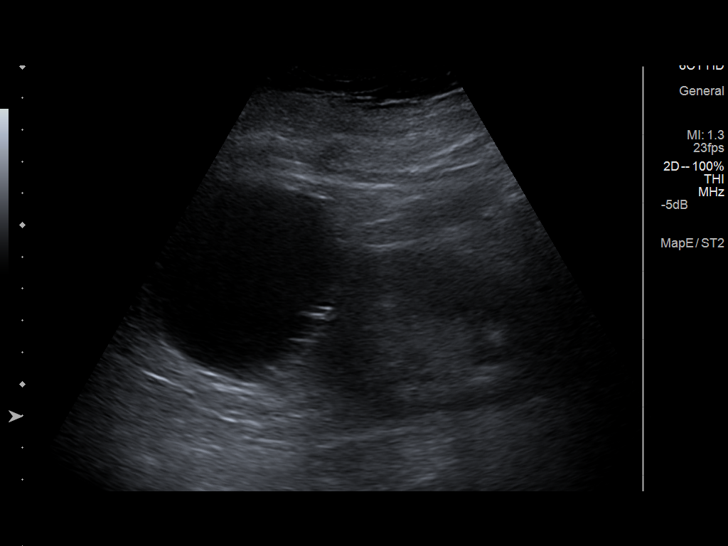
[im 36/66]
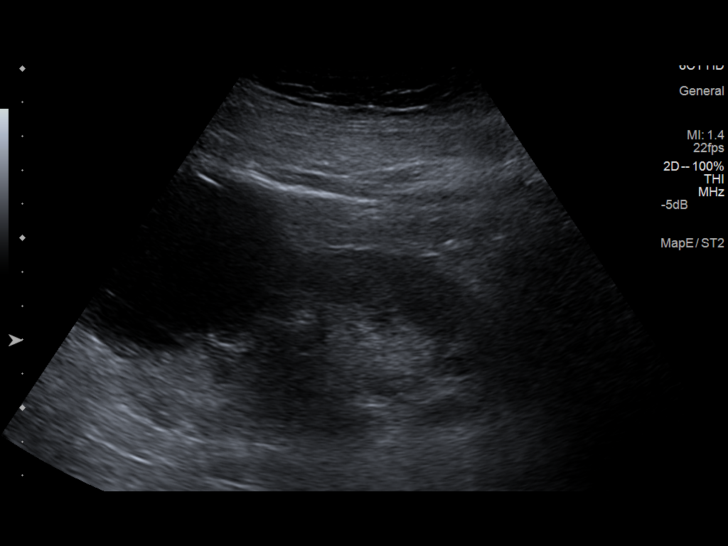
[im 41/66]
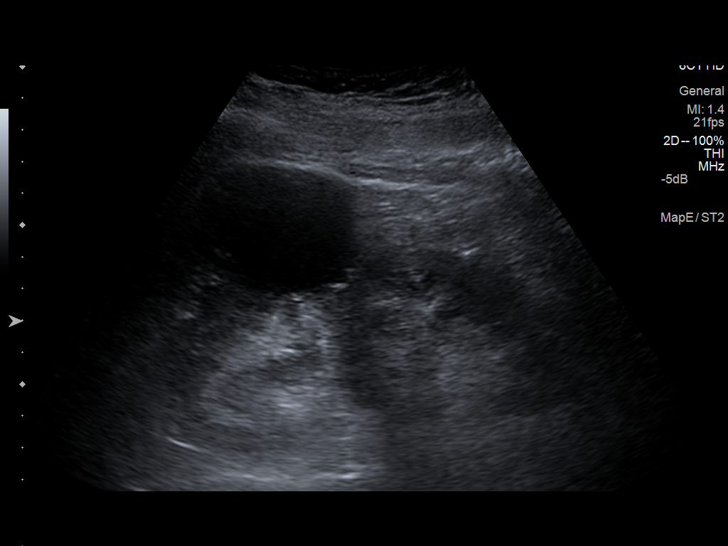
[im 44/66]
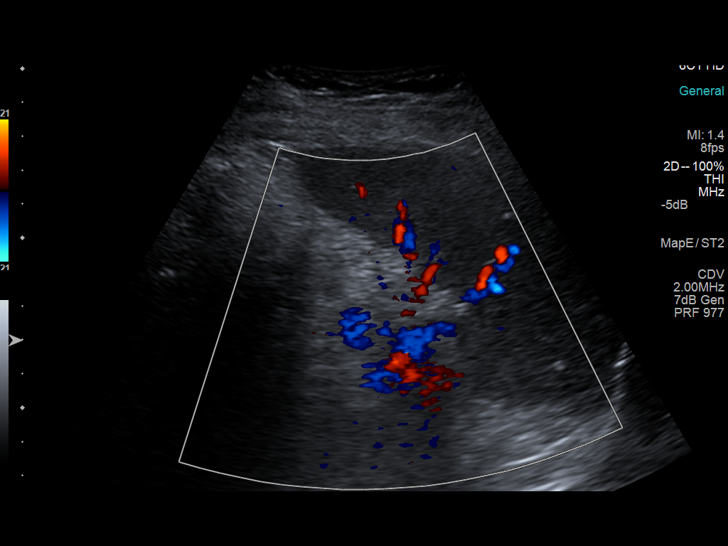
[im 49/66]
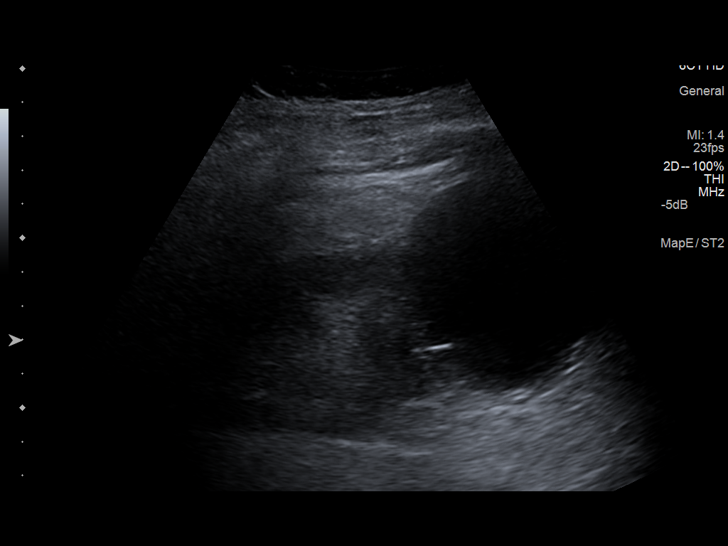
[im 55/66]
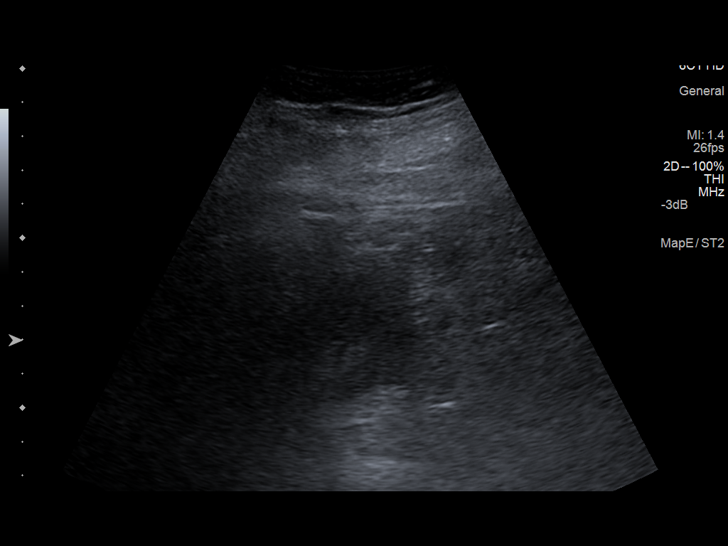
[im 60/66]
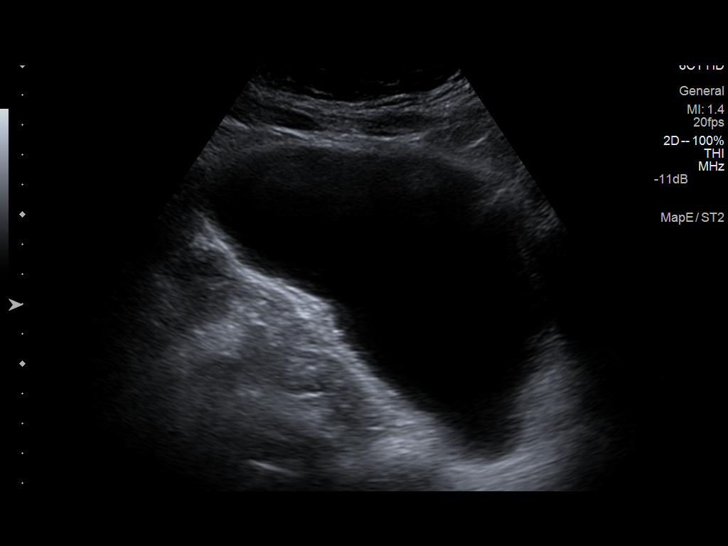
[im 66/66]
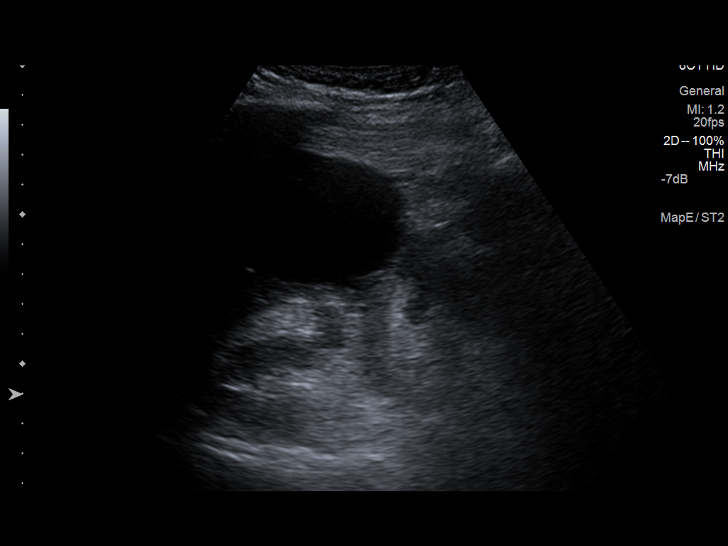

[14 of 25 positions shown; findings below may reference images not displayed]

FINDINGS: Right Kidney:  11.5 cm in length.  Normal renal cortical
echogenicity and mild renal cortical thinning.  A mid pole cyst is
noted.  No hydronephrosis.

Left Kidney:  15.1 cm in length.  Mild hydronephrosis is present.
There is a 6 cm upper pole cyst.

Bladder:  The right ureteral jet is noted.  No left ureteral jet is
visualized.  No bladder wall thickening or mass.

Additional findings:  A right pleural effusion is noted.  Calcified
granulomas are noted in the spleen.
IMPRESSION: 1.  Persistent mild left-sided hydronephrosis likely due to the
left ureteral calculus seen on the prior CT scan.
2.  Bilateral renal cysts.

## 2015-08-22 DIAGNOSIS — N189 Chronic kidney disease, unspecified: Secondary | ICD-10-CM | POA: Diagnosis not present

## 2015-08-22 DIAGNOSIS — I10 Essential (primary) hypertension: Secondary | ICD-10-CM | POA: Diagnosis not present

## 2015-08-22 DIAGNOSIS — N2581 Secondary hyperparathyroidism of renal origin: Secondary | ICD-10-CM | POA: Diagnosis not present

## 2015-08-22 DIAGNOSIS — E119 Type 2 diabetes mellitus without complications: Secondary | ICD-10-CM | POA: Diagnosis not present

## 2015-08-28 ENCOUNTER — Other Ambulatory Visit: Payer: Self-pay | Admitting: Cardiology

## 2015-11-16 DIAGNOSIS — H401122 Primary open-angle glaucoma, left eye, moderate stage: Secondary | ICD-10-CM | POA: Diagnosis not present

## 2015-11-16 DIAGNOSIS — H11153 Pinguecula, bilateral: Secondary | ICD-10-CM | POA: Diagnosis not present

## 2015-11-16 DIAGNOSIS — Z961 Presence of intraocular lens: Secondary | ICD-10-CM | POA: Diagnosis not present

## 2015-11-16 DIAGNOSIS — H40021 Open angle with borderline findings, high risk, right eye: Secondary | ICD-10-CM | POA: Diagnosis not present

## 2015-11-16 DIAGNOSIS — H18413 Arcus senilis, bilateral: Secondary | ICD-10-CM | POA: Diagnosis not present

## 2015-11-16 DIAGNOSIS — H353131 Nonexudative age-related macular degeneration, bilateral, early dry stage: Secondary | ICD-10-CM | POA: Diagnosis not present

## 2015-11-16 DIAGNOSIS — H5340 Unspecified visual field defects: Secondary | ICD-10-CM | POA: Diagnosis not present

## 2015-11-16 DIAGNOSIS — H35363 Drusen (degenerative) of macula, bilateral: Secondary | ICD-10-CM | POA: Diagnosis not present

## 2015-11-26 ENCOUNTER — Other Ambulatory Visit: Payer: Self-pay | Admitting: Cardiology

## 2015-12-19 DIAGNOSIS — Z7901 Long term (current) use of anticoagulants: Secondary | ICD-10-CM | POA: Diagnosis not present

## 2015-12-19 DIAGNOSIS — K219 Gastro-esophageal reflux disease without esophagitis: Secondary | ICD-10-CM | POA: Diagnosis not present

## 2015-12-19 DIAGNOSIS — G629 Polyneuropathy, unspecified: Secondary | ICD-10-CM | POA: Diagnosis not present

## 2015-12-19 DIAGNOSIS — N184 Chronic kidney disease, stage 4 (severe): Secondary | ICD-10-CM | POA: Diagnosis not present

## 2015-12-19 DIAGNOSIS — I480 Paroxysmal atrial fibrillation: Secondary | ICD-10-CM | POA: Diagnosis not present

## 2015-12-19 DIAGNOSIS — Z6828 Body mass index (BMI) 28.0-28.9, adult: Secondary | ICD-10-CM | POA: Diagnosis not present

## 2015-12-19 DIAGNOSIS — E1129 Type 2 diabetes mellitus with other diabetic kidney complication: Secondary | ICD-10-CM | POA: Diagnosis not present

## 2015-12-19 DIAGNOSIS — E7800 Pure hypercholesterolemia, unspecified: Secondary | ICD-10-CM | POA: Diagnosis not present

## 2015-12-19 DIAGNOSIS — R5381 Other malaise: Secondary | ICD-10-CM | POA: Diagnosis not present

## 2015-12-19 DIAGNOSIS — E1151 Type 2 diabetes mellitus with diabetic peripheral angiopathy without gangrene: Secondary | ICD-10-CM | POA: Diagnosis not present

## 2015-12-19 DIAGNOSIS — N2581 Secondary hyperparathyroidism of renal origin: Secondary | ICD-10-CM | POA: Diagnosis not present

## 2015-12-19 DIAGNOSIS — I209 Angina pectoris, unspecified: Secondary | ICD-10-CM | POA: Diagnosis not present

## 2016-02-16 DIAGNOSIS — H18413 Arcus senilis, bilateral: Secondary | ICD-10-CM | POA: Diagnosis not present

## 2016-02-16 DIAGNOSIS — H353131 Nonexudative age-related macular degeneration, bilateral, early dry stage: Secondary | ICD-10-CM | POA: Diagnosis not present

## 2016-02-16 DIAGNOSIS — H5340 Unspecified visual field defects: Secondary | ICD-10-CM | POA: Diagnosis not present

## 2016-02-16 DIAGNOSIS — Z9849 Cataract extraction status, unspecified eye: Secondary | ICD-10-CM | POA: Diagnosis not present

## 2016-02-16 DIAGNOSIS — H11153 Pinguecula, bilateral: Secondary | ICD-10-CM | POA: Diagnosis not present

## 2016-02-16 DIAGNOSIS — H04123 Dry eye syndrome of bilateral lacrimal glands: Secondary | ICD-10-CM | POA: Diagnosis not present

## 2016-02-16 DIAGNOSIS — Z961 Presence of intraocular lens: Secondary | ICD-10-CM | POA: Diagnosis not present

## 2016-02-16 DIAGNOSIS — H401122 Primary open-angle glaucoma, left eye, moderate stage: Secondary | ICD-10-CM | POA: Diagnosis not present

## 2016-02-16 DIAGNOSIS — H04122 Dry eye syndrome of left lacrimal gland: Secondary | ICD-10-CM | POA: Diagnosis not present

## 2016-02-16 DIAGNOSIS — H11423 Conjunctival edema, bilateral: Secondary | ICD-10-CM | POA: Diagnosis not present

## 2016-02-22 ENCOUNTER — Emergency Department (HOSPITAL_COMMUNITY)
Admission: EM | Admit: 2016-02-22 | Discharge: 2016-02-22 | Disposition: A | Discharge status: Home / Self Care | Payer: Medicare Other | Attending: Emergency Medicine | Admitting: Emergency Medicine

## 2016-02-22 ENCOUNTER — Encounter (HOSPITAL_COMMUNITY): Payer: Self-pay | Admitting: *Deleted

## 2016-02-22 DIAGNOSIS — E1122 Type 2 diabetes mellitus with diabetic chronic kidney disease: Secondary | ICD-10-CM | POA: Diagnosis not present

## 2016-02-22 DIAGNOSIS — Z79899 Other long term (current) drug therapy: Secondary | ICD-10-CM | POA: Insufficient documentation

## 2016-02-22 DIAGNOSIS — R040 Epistaxis: Secondary | ICD-10-CM | POA: Insufficient documentation

## 2016-02-22 DIAGNOSIS — I129 Hypertensive chronic kidney disease with stage 1 through stage 4 chronic kidney disease, or unspecified chronic kidney disease: Secondary | ICD-10-CM | POA: Diagnosis not present

## 2016-02-22 DIAGNOSIS — N183 Chronic kidney disease, stage 3 (moderate): Secondary | ICD-10-CM | POA: Insufficient documentation

## 2016-02-22 DIAGNOSIS — R0902 Hypoxemia: Secondary | ICD-10-CM | POA: Diagnosis not present

## 2016-02-22 DIAGNOSIS — Z7982 Long term (current) use of aspirin: Secondary | ICD-10-CM | POA: Diagnosis not present

## 2016-02-22 DIAGNOSIS — I2510 Atherosclerotic heart disease of native coronary artery without angina pectoris: Secondary | ICD-10-CM | POA: Insufficient documentation

## 2016-02-22 DIAGNOSIS — Z794 Long term (current) use of insulin: Secondary | ICD-10-CM | POA: Diagnosis not present

## 2016-02-22 LAB — CBC
HCT: 40.2 % (ref 39.0–52.0)
Hemoglobin: 12.2 g/dL — ABNORMAL LOW (ref 13.0–17.0)
MCH: 29.3 pg (ref 26.0–34.0)
MCHC: 30.3 g/dL (ref 30.0–36.0)
MCV: 96.6 fL (ref 78.0–100.0)
PLATELETS: 181 10*3/uL (ref 150–400)
RBC: 4.16 MIL/uL — AB (ref 4.22–5.81)
RDW: 14.3 % (ref 11.5–15.5)
WBC: 11.7 10*3/uL — AB (ref 4.0–10.5)

## 2016-02-22 MED ORDER — OXYMETAZOLINE HCL 0.05 % NA SOLN
1.0000 | Freq: Once | NASAL | Status: AC
  Administered 2016-02-22: 1 via NASAL
  Filled 2016-02-22: qty 15

## 2016-02-22 MED ORDER — PHENYLEPHRINE HCL 0.5 % NA SOLN
1.0000 [drp] | Freq: Once | NASAL | Status: AC
  Administered 2016-02-22: 1 [drp] via NASAL
  Filled 2016-02-22: qty 15

## 2016-02-22 NOTE — Discharge Instructions (Signed)
Please do not blow your nose or put anything into your nose for the next 3 days. You may use over-the-counter nasal saline and a humidifier in your room to help keep your nasal mucosa moist. If you began having bleeding again you may blow your nose, then spray both Afrin and phenylephrine in both nostrils and then hold pressure for 30 minutes without letting go. If this does not stop the bleeding, please return to the hospital. If you continue to have recurrent nosebleeds we recommend that you follow-up with a ear nose and throat physician.

## 2016-02-22 NOTE — ED Provider Notes (Signed)
TIME SEEN: 4:40 AM  CHIEF COMPLAINT: Epistaxis  HPI: Pt is a 82 y.o. M with history of CAD, hypertension, hyperlipidemia, atrial fibrillation on Eliquis who presents to the emergency department with right-sided nosebleed that started at 1:45 AM. He has had 2 nosebleeds in the past several days. Mostly coming from the right nostril. No injury to the nose, foreign bodies in the nose, fever, sneezing, cough. Family does report that their air conditioning has been out for the past several days and they think this is what is concerning to his nosebleeds.  ROS: See HPI Constitutional: no fever  Eyes: no drainage  ENT: no runny nose   Cardiovascular:  no chest pain  Resp: no SOB  GI: no vomiting GU: no dysuria Integumentary: no rash  Allergy: no hives  Musculoskeletal: no leg swelling  Neurological: no slurred speech ROS otherwise negative  PAST MEDICAL HISTORY/PAST SURGICAL HISTORY:  Past Medical History:  Diagnosis Date  . CAD (coronary artery disease), native coronary artery    Catheterization 2008 showing moderate nonobstructive disease in LAD, circumflex and right coronary artery, no lesions greater than 40%.  EF of 35% at that time.   Marland Kitchen GERD (gastroesophageal reflux disease)   . History of kidney stones   . Hypercholesterolemia   . Hyperlipidemia 12/02/2008  . Hypertension   . Kidney disease, chronic, stage III (GFR 30-59 ml/min) 08/29/2014  . Paroxysmal atrial fibrillation (HCC) 08/29/2014   Chads2VASC score 5   . Transient global amnesia 2008   . Type 2 diabetes mellitus with renal manifestations, controlled ([place])     MEDICATIONS:  Prior to Admission medications   Medication Sig Start Date End Date Taking? Authorizing Provider  albuterol (PROVENTIL HFA;VENTOLIN HFA) 108 (90 BASE) MCG/ACT inhaler Inhale 2 puffs into the lungs every 6 (six) hours as needed for wheezing or shortness of breath. 04/30/15   Reyne Dumas, MD  aspirin EC 81 MG tablet Take 1 tablet (81 mg total) by mouth  daily. Patient taking differently: Take 162 mg by mouth at bedtime.  08/31/14   Brittainy Erie Noe, PA-C  atorvastatin (LIPITOR) 40 MG tablet Take 40 mg by mouth daily.    Historical Provider, MD  Calcium Carbonate-Vitamin D (CALCIUM-VITAMIN D) 500-200 MG-UNIT per tablet Take 1 tablet by mouth daily.    Historical Provider, MD  cycloSPORINE (RESTASIS) 0.05 % ophthalmic emulsion Place 2 drops into both eyes 2 (two) times daily. Right eye in the morning and left eye in the evening    Historical Provider, MD  docusate sodium (COLACE) 100 MG capsule Take 100 mg by mouth at bedtime.    Historical Provider, MD  ELIQUIS 2.5 MG TABS tablet TAKE 1 TABLET TWICE A DAY, (NEED TO SCHEDULE MD VISIT FOR FURTHER REFILLS) 11/29/15   Brittainy Erie Noe, PA-C  fish oil-omega-3 fatty acids 1000 MG capsule Take 1 capsule (1 g total) by mouth daily. Stop taking if having diarrhea 02/05/12   Ripudeep Krystal Eaton, MD  fluticasone (FLONASE) 50 MCG/ACT nasal spray Place 2 sprays into both nostrils daily. 04/30/15   Reyne Dumas, MD  guaiFENesin-codeine 100-10 MG/5ML syrup Take 5 mLs by mouth every 4 (four) hours as needed for cough. 04/30/15   Reyne Dumas, MD  HYDROcodone-acetaminophen (NORCO) 10-325 MG per tablet Take 1-2 tablets by mouth every 4 (four) hours as needed for pain. Patient not taking: Reported on 04/28/2015 03/16/13   Kathie Rhodes, MD  insulin aspart (NOVOLOG) 100 UNIT/ML injection Inject 0-15 Units into the skin 4 (four) times daily -  with meals and at bedtime. Sliding scale, 70-120-0 units, 121-150 2 units, 151-200 3 units, 201-250 3 units 251-300 8 units 301-350 11 units, 351-400 15 units, beyond call dr    Historical Provider, MD  insulin detemir (LEVEMIR) 100 UNIT/ML injection Inject 23 Units into the skin at bedtime.     Historical Provider, MD  isosorbide mononitrate (IMDUR) 30 MG 24 hr tablet Take 30 mg by mouth daily.    Historical Provider, MD  linagliptin (TRADJENTA) 5 MG TABS tablet Take 1 tablet (5 mg  total) by mouth daily. 02/05/12   Ripudeep Krystal Eaton, MD  metoprolol tartrate (LOPRESSOR) 25 MG tablet Take 1 tablet (25 mg total) by mouth 2 (two) times daily. 07/15/15   Brittainy Erie Noe, PA-C  Multiple Vitamins-Minerals (MULTIVITAMIN WITH MINERALS) tablet Take 1 tablet by mouth daily.     Historical Provider, MD  Multiple Vitamins-Minerals (PRESERVISION AREDS 2) CAPS Take 1 capsule by mouth 2 (two) times daily.     Historical Provider, MD  nitrofurantoin, macrocrystal-monohydrate, (MACROBID) 100 MG capsule Take 1 capsule (100 mg total) by mouth 2 (two) times daily. 04/30/15   Reyne Dumas, MD  nitroGLYCERIN (NITROSTAT) 0.4 MG SL tablet Place 1 tablet (0.4 mg total) under the tongue every 5 (five) minutes x 3 doses as needed for chest pain. 08/31/14   Brittainy Erie Noe, PA-C  omeprazole (PRILOSEC) 20 MG capsule Take 20 mg by mouth daily.     Historical Provider, MD  polyethylene glycol (MIRALAX / GLYCOLAX) packet Take 17 g by mouth 2 (two) times daily as needed (for constipation).     Historical Provider, MD  Pyridoxine HCl (VITAMIN B-6) 500 MG tablet Take 500 mg by mouth 2 (two) times daily.     Historical Provider, MD  tamsulosin (FLOMAX) 0.4 MG CAPS capsule Take 1 capsule (0.4 mg total) by mouth daily after supper. Patient not taking: Reported on 04/28/2015 03/16/13   Kathie Rhodes, MD  timolol (BETIMOL) 0.5 % ophthalmic solution Place 1 drop into the left eye daily.     Historical Provider, MD    ALLERGIES:  No Known Allergies  SOCIAL HISTORY:  Social History  Substance Use Topics  . Smoking status: Never Smoker  . Smokeless tobacco: Not on file  . Alcohol use 0.6 oz/week    1 Cans of beer per week     Comment: occasionally    FAMILY HISTORY: Family History  Problem Relation Age of Onset  . Heart attack Father   . Emphysema Brother     EXAM: BP 117/68 (BP Location: Left Arm)   Pulse 80   Temp 97.9 F (36.6 C) (Oral)   Resp 18   SpO2 98%  CONSTITUTIONAL: Alert and oriented and  responds appropriately to questions. Well-appearing; well-nourished HEAD: Normocephalic EYES: Conjunctivae clear, PERRL ENT: normal nose; no rhinorrhea; moist mucous membranes; no blood coming from either nostril at this time but he does have some friable appearing vessels along the right anterior septum, no blood in the posterior oropharynx NECK: Supple, no meningismus, no LAD  CARD: RRR; S1 and S2 appreciated; no murmurs, no clicks, no rubs, no gallops RESP: Normal chest excursion without splinting or tachypnea; breath sounds clear and equal bilaterally; no wheezes, no rhonchi, no rales, no hypoxia or respiratory distress, speaking full sentences ABD/GI: Normal bowel sounds; non-distended; soft, non-tender, no rebound, no guarding, no peritoneal signs BACK:  The back appears normal and is non-tender to palpation, there is no CVA tenderness EXT: Normal ROM in all joints;  non-tender to palpation; no edema; normal capillary refill; no cyanosis, no calf tenderness or swelling    SKIN: Normal color for age and race; warm; no rash NEURO: Moves all extremities equally, sensation to light touch intact diffusely, cranial nerves II through XII intact PSYCH: The patient's mood and manner are appropriate. Grooming and personal hygiene are appropriate.  MEDICAL DECISION MAKING: Patient here with nosebleed that has stopped after nursing staff is applied Neo-Synephrine and Afrin nasal spray and had patient hold pressure for 30 minutes. Patient's family reports that he has lost a large amount of blood in the past several days. We'll check a CBC. He is currently hemodynamically stable. We'll continue to monitor him while in the ED to ensure no further nosebleed.  ED PROGRESS: 6:45 AM  Pt's hemoglobin is stable. He is still hemodynamically stable without any further nosebleed. I feel he is safe to be discharged home with outpatient follow-up. Have recommended they use a humidifier at home and nasal saline as needed.  Recommend he do not blow his nose or put anything into his nose for three days.  Will discharge the patient home with a bottle of Afrin that he can use at home of bleeding starts again. Have recommended he hold pressure for 30 minutes without stopping and if bleeding continues to return to the hospital.  At this time, I do not feel there is any life-threatening condition present. I have reviewed and discussed all results (EKG, imaging, lab, urine as appropriate), exam findings with patient/family. I have reviewed nursing notes and appropriate previous records.  I feel the patient is safe to be discharged home without further emergent workup and can continue workup as an outpatient. Discussed usual and customary return precautions. Patient/family verbalize understanding and are comfortable with this plan.  Outpatient follow-up has been provided. All questions have been answered.    [place], DO 02/22/16 [phone]

## 2016-02-22 NOTE — ED Triage Notes (Signed)
Per EMS: pt coming from home with c/o nose bleed, onset 0145, bleeding uncontrolled. Pt on Eliquis, pt denies HA, blurred vision, n/v.

## 2016-04-19 DIAGNOSIS — N189 Chronic kidney disease, unspecified: Secondary | ICD-10-CM | POA: Diagnosis not present

## 2016-04-19 DIAGNOSIS — Z23 Encounter for immunization: Secondary | ICD-10-CM | POA: Diagnosis not present

## 2016-04-19 DIAGNOSIS — I10 Essential (primary) hypertension: Secondary | ICD-10-CM | POA: Diagnosis not present

## 2016-04-19 DIAGNOSIS — E119 Type 2 diabetes mellitus without complications: Secondary | ICD-10-CM | POA: Diagnosis not present

## 2016-04-19 DIAGNOSIS — N2581 Secondary hyperparathyroidism of renal origin: Secondary | ICD-10-CM | POA: Diagnosis not present

## 2016-04-26 DIAGNOSIS — L814 Other melanin hyperpigmentation: Secondary | ICD-10-CM | POA: Diagnosis not present

## 2016-04-26 DIAGNOSIS — D1801 Hemangioma of skin and subcutaneous tissue: Secondary | ICD-10-CM | POA: Diagnosis not present

## 2016-04-26 DIAGNOSIS — L821 Other seborrheic keratosis: Secondary | ICD-10-CM | POA: Diagnosis not present

## 2016-04-26 DIAGNOSIS — N189 Chronic kidney disease, unspecified: Secondary | ICD-10-CM | POA: Diagnosis not present

## 2016-04-26 DIAGNOSIS — Z85828 Personal history of other malignant neoplasm of skin: Secondary | ICD-10-CM | POA: Diagnosis not present

## 2016-04-26 DIAGNOSIS — D225 Melanocytic nevi of trunk: Secondary | ICD-10-CM | POA: Diagnosis not present

## 2016-05-03 ENCOUNTER — Other Ambulatory Visit: Payer: Self-pay | Admitting: Cardiology

## 2016-05-14 DIAGNOSIS — Z9849 Cataract extraction status, unspecified eye: Secondary | ICD-10-CM | POA: Diagnosis not present

## 2016-05-14 DIAGNOSIS — H18413 Arcus senilis, bilateral: Secondary | ICD-10-CM | POA: Diagnosis not present

## 2016-05-14 DIAGNOSIS — H11423 Conjunctival edema, bilateral: Secondary | ICD-10-CM | POA: Diagnosis not present

## 2016-05-14 DIAGNOSIS — H401121 Primary open-angle glaucoma, left eye, mild stage: Secondary | ICD-10-CM | POA: Diagnosis not present

## 2016-05-14 DIAGNOSIS — H353131 Nonexudative age-related macular degeneration, bilateral, early dry stage: Secondary | ICD-10-CM | POA: Diagnosis not present

## 2016-05-14 DIAGNOSIS — H04123 Dry eye syndrome of bilateral lacrimal glands: Secondary | ICD-10-CM | POA: Diagnosis not present

## 2016-05-14 DIAGNOSIS — Z961 Presence of intraocular lens: Secondary | ICD-10-CM | POA: Diagnosis not present

## 2016-05-14 DIAGNOSIS — H11153 Pinguecula, bilateral: Secondary | ICD-10-CM | POA: Diagnosis not present

## 2016-05-14 DIAGNOSIS — H47233 Glaucomatous optic atrophy, bilateral: Secondary | ICD-10-CM | POA: Diagnosis not present

## 2016-06-14 DIAGNOSIS — N390 Urinary tract infection, site not specified: Secondary | ICD-10-CM | POA: Diagnosis not present

## 2016-06-14 DIAGNOSIS — R8299 Other abnormal findings in urine: Secondary | ICD-10-CM | POA: Diagnosis not present

## 2016-06-14 DIAGNOSIS — E1129 Type 2 diabetes mellitus with other diabetic kidney complication: Secondary | ICD-10-CM | POA: Diagnosis not present

## 2016-06-14 DIAGNOSIS — E7800 Pure hypercholesterolemia, unspecified: Secondary | ICD-10-CM | POA: Diagnosis not present

## 2016-06-14 DIAGNOSIS — Z125 Encounter for screening for malignant neoplasm of prostate: Secondary | ICD-10-CM | POA: Diagnosis not present

## 2016-06-21 DIAGNOSIS — Z7901 Long term (current) use of anticoagulants: Secondary | ICD-10-CM | POA: Diagnosis not present

## 2016-06-21 DIAGNOSIS — E7800 Pure hypercholesterolemia, unspecified: Secondary | ICD-10-CM | POA: Diagnosis not present

## 2016-06-21 DIAGNOSIS — G6289 Other specified polyneuropathies: Secondary | ICD-10-CM | POA: Diagnosis not present

## 2016-06-21 DIAGNOSIS — N184 Chronic kidney disease, stage 4 (severe): Secondary | ICD-10-CM | POA: Diagnosis not present

## 2016-06-21 DIAGNOSIS — Z1389 Encounter for screening for other disorder: Secondary | ICD-10-CM | POA: Diagnosis not present

## 2016-06-21 DIAGNOSIS — Z0000 Encounter for general adult medical examination without abnormal findings: Secondary | ICD-10-CM | POA: Diagnosis not present

## 2016-06-21 DIAGNOSIS — I208 Other forms of angina pectoris: Secondary | ICD-10-CM | POA: Diagnosis not present

## 2016-06-21 DIAGNOSIS — I480 Paroxysmal atrial fibrillation: Secondary | ICD-10-CM | POA: Diagnosis not present

## 2016-06-21 DIAGNOSIS — Z6829 Body mass index (BMI) 29.0-29.9, adult: Secondary | ICD-10-CM | POA: Diagnosis not present

## 2016-06-21 DIAGNOSIS — E1129 Type 2 diabetes mellitus with other diabetic kidney complication: Secondary | ICD-10-CM | POA: Diagnosis not present

## 2016-06-21 DIAGNOSIS — I2510 Atherosclerotic heart disease of native coronary artery without angina pectoris: Secondary | ICD-10-CM | POA: Diagnosis not present

## 2016-06-21 DIAGNOSIS — M1990 Unspecified osteoarthritis, unspecified site: Secondary | ICD-10-CM | POA: Diagnosis not present

## 2016-08-14 DIAGNOSIS — H40001 Preglaucoma, unspecified, right eye: Secondary | ICD-10-CM | POA: Diagnosis not present

## 2016-08-14 DIAGNOSIS — H353132 Nonexudative age-related macular degeneration, bilateral, intermediate dry stage: Secondary | ICD-10-CM | POA: Diagnosis not present

## 2016-08-14 DIAGNOSIS — H40021 Open angle with borderline findings, high risk, right eye: Secondary | ICD-10-CM | POA: Diagnosis not present

## 2016-08-14 DIAGNOSIS — H47233 Glaucomatous optic atrophy, bilateral: Secondary | ICD-10-CM | POA: Diagnosis not present

## 2016-08-14 DIAGNOSIS — H11153 Pinguecula, bilateral: Secondary | ICD-10-CM | POA: Diagnosis not present

## 2016-08-14 DIAGNOSIS — E119 Type 2 diabetes mellitus without complications: Secondary | ICD-10-CM | POA: Diagnosis not present

## 2016-08-14 DIAGNOSIS — Z961 Presence of intraocular lens: Secondary | ICD-10-CM | POA: Diagnosis not present

## 2016-08-14 DIAGNOSIS — H04123 Dry eye syndrome of bilateral lacrimal glands: Secondary | ICD-10-CM | POA: Diagnosis not present

## 2016-08-14 DIAGNOSIS — H18413 Arcus senilis, bilateral: Secondary | ICD-10-CM | POA: Diagnosis not present

## 2016-08-14 DIAGNOSIS — H401121 Primary open-angle glaucoma, left eye, mild stage: Secondary | ICD-10-CM | POA: Diagnosis not present

## 2016-08-14 DIAGNOSIS — S0500XD Injury of conjunctiva and corneal abrasion without foreign body, unspecified eye, subsequent encounter: Secondary | ICD-10-CM | POA: Diagnosis not present

## 2016-08-14 DIAGNOSIS — H11423 Conjunctival edema, bilateral: Secondary | ICD-10-CM | POA: Diagnosis not present

## 2016-11-12 DIAGNOSIS — H18413 Arcus senilis, bilateral: Secondary | ICD-10-CM | POA: Diagnosis not present

## 2016-11-12 DIAGNOSIS — H47233 Glaucomatous optic atrophy, bilateral: Secondary | ICD-10-CM | POA: Diagnosis not present

## 2016-11-12 DIAGNOSIS — H5201 Hypermetropia, right eye: Secondary | ICD-10-CM | POA: Diagnosis not present

## 2016-11-12 DIAGNOSIS — H04123 Dry eye syndrome of bilateral lacrimal glands: Secondary | ICD-10-CM | POA: Diagnosis not present

## 2016-11-12 DIAGNOSIS — H11423 Conjunctival edema, bilateral: Secondary | ICD-10-CM | POA: Diagnosis not present

## 2016-11-12 DIAGNOSIS — H52223 Regular astigmatism, bilateral: Secondary | ICD-10-CM | POA: Diagnosis not present

## 2016-11-12 DIAGNOSIS — Z9849 Cataract extraction status, unspecified eye: Secondary | ICD-10-CM | POA: Diagnosis not present

## 2016-11-12 DIAGNOSIS — H401231 Low-tension glaucoma, bilateral, mild stage: Secondary | ICD-10-CM | POA: Diagnosis not present

## 2016-11-12 DIAGNOSIS — Z961 Presence of intraocular lens: Secondary | ICD-10-CM | POA: Diagnosis not present

## 2016-11-12 DIAGNOSIS — H353132 Nonexudative age-related macular degeneration, bilateral, intermediate dry stage: Secondary | ICD-10-CM | POA: Diagnosis not present

## 2016-11-12 DIAGNOSIS — H11153 Pinguecula, bilateral: Secondary | ICD-10-CM | POA: Diagnosis not present

## 2016-11-12 DIAGNOSIS — H35033 Hypertensive retinopathy, bilateral: Secondary | ICD-10-CM | POA: Diagnosis not present

## 2016-11-16 DIAGNOSIS — L219 Seborrheic dermatitis, unspecified: Secondary | ICD-10-CM | POA: Diagnosis not present

## 2016-11-16 DIAGNOSIS — R309 Painful micturition, unspecified: Secondary | ICD-10-CM | POA: Diagnosis not present

## 2016-11-16 DIAGNOSIS — N401 Enlarged prostate with lower urinary tract symptoms: Secondary | ICD-10-CM | POA: Diagnosis not present

## 2016-11-16 DIAGNOSIS — Z6828 Body mass index (BMI) 28.0-28.9, adult: Secondary | ICD-10-CM | POA: Diagnosis not present

## 2016-11-16 DIAGNOSIS — N390 Urinary tract infection, site not specified: Secondary | ICD-10-CM | POA: Diagnosis not present

## 2016-11-16 DIAGNOSIS — N359 Urethral stricture, unspecified: Secondary | ICD-10-CM | POA: Diagnosis not present

## 2016-12-03 DIAGNOSIS — R309 Painful micturition, unspecified: Secondary | ICD-10-CM | POA: Diagnosis not present

## 2016-12-03 DIAGNOSIS — N390 Urinary tract infection, site not specified: Secondary | ICD-10-CM | POA: Diagnosis not present

## 2016-12-03 DIAGNOSIS — R8299 Other abnormal findings in urine: Secondary | ICD-10-CM | POA: Diagnosis not present

## 2016-12-05 DIAGNOSIS — N189 Chronic kidney disease, unspecified: Secondary | ICD-10-CM | POA: Diagnosis not present

## 2016-12-05 DIAGNOSIS — N2581 Secondary hyperparathyroidism of renal origin: Secondary | ICD-10-CM | POA: Diagnosis not present

## 2016-12-05 DIAGNOSIS — E119 Type 2 diabetes mellitus without complications: Secondary | ICD-10-CM | POA: Diagnosis not present

## 2016-12-05 DIAGNOSIS — I10 Essential (primary) hypertension: Secondary | ICD-10-CM | POA: Diagnosis not present

## 2016-12-11 DIAGNOSIS — R309 Painful micturition, unspecified: Secondary | ICD-10-CM | POA: Diagnosis not present

## 2017-03-20 DIAGNOSIS — H35033 Hypertensive retinopathy, bilateral: Secondary | ICD-10-CM | POA: Diagnosis not present

## 2017-03-20 DIAGNOSIS — Z7984 Long term (current) use of oral hypoglycemic drugs: Secondary | ICD-10-CM | POA: Diagnosis not present

## 2017-03-20 DIAGNOSIS — H11153 Pinguecula, bilateral: Secondary | ICD-10-CM | POA: Diagnosis not present

## 2017-03-20 DIAGNOSIS — H401221 Low-tension glaucoma, left eye, mild stage: Secondary | ICD-10-CM | POA: Diagnosis not present

## 2017-03-20 DIAGNOSIS — H04123 Dry eye syndrome of bilateral lacrimal glands: Secondary | ICD-10-CM | POA: Diagnosis not present

## 2017-03-20 DIAGNOSIS — Z961 Presence of intraocular lens: Secondary | ICD-10-CM | POA: Diagnosis not present

## 2017-03-20 DIAGNOSIS — H18413 Arcus senilis, bilateral: Secondary | ICD-10-CM | POA: Diagnosis not present

## 2017-03-20 DIAGNOSIS — E109 Type 1 diabetes mellitus without complications: Secondary | ICD-10-CM | POA: Diagnosis not present

## 2017-03-20 DIAGNOSIS — H11423 Conjunctival edema, bilateral: Secondary | ICD-10-CM | POA: Diagnosis not present

## 2017-03-20 DIAGNOSIS — Z9849 Cataract extraction status, unspecified eye: Secondary | ICD-10-CM | POA: Diagnosis not present

## 2017-03-20 DIAGNOSIS — Z794 Long term (current) use of insulin: Secondary | ICD-10-CM | POA: Diagnosis not present

## 2017-03-20 DIAGNOSIS — H353131 Nonexudative age-related macular degeneration, bilateral, early dry stage: Secondary | ICD-10-CM | POA: Diagnosis not present

## 2017-05-02 DIAGNOSIS — Z23 Encounter for immunization: Secondary | ICD-10-CM | POA: Diagnosis not present

## 2017-05-16 ENCOUNTER — Encounter (HOSPITAL_COMMUNITY): Payer: Self-pay | Admitting: Emergency Medicine

## 2017-05-16 ENCOUNTER — Emergency Department (HOSPITAL_COMMUNITY): Payer: Medicare Other

## 2017-05-16 ENCOUNTER — Emergency Department (HOSPITAL_COMMUNITY)
Admission: EM | Admit: 2017-05-16 | Discharge: 2017-05-17 | Disposition: A | Discharge status: Home / Self Care | Payer: Medicare Other | Attending: Emergency Medicine | Admitting: Emergency Medicine

## 2017-05-16 DIAGNOSIS — Z7982 Long term (current) use of aspirin: Secondary | ICD-10-CM | POA: Diagnosis not present

## 2017-05-16 DIAGNOSIS — Z794 Long term (current) use of insulin: Secondary | ICD-10-CM | POA: Diagnosis not present

## 2017-05-16 DIAGNOSIS — Z79899 Other long term (current) drug therapy: Secondary | ICD-10-CM | POA: Diagnosis not present

## 2017-05-16 DIAGNOSIS — E1122 Type 2 diabetes mellitus with diabetic chronic kidney disease: Secondary | ICD-10-CM | POA: Diagnosis not present

## 2017-05-16 DIAGNOSIS — I2510 Atherosclerotic heart disease of native coronary artery without angina pectoris: Secondary | ICD-10-CM | POA: Diagnosis not present

## 2017-05-16 DIAGNOSIS — N3000 Acute cystitis without hematuria: Secondary | ICD-10-CM | POA: Diagnosis not present

## 2017-05-16 DIAGNOSIS — N183 Chronic kidney disease, stage 3 (moderate): Secondary | ICD-10-CM | POA: Diagnosis not present

## 2017-05-16 DIAGNOSIS — I129 Hypertensive chronic kidney disease with stage 1 through stage 4 chronic kidney disease, or unspecified chronic kidney disease: Secondary | ICD-10-CM | POA: Insufficient documentation

## 2017-05-16 DIAGNOSIS — R531 Weakness: Secondary | ICD-10-CM | POA: Diagnosis not present

## 2017-05-16 DIAGNOSIS — E785 Hyperlipidemia, unspecified: Secondary | ICD-10-CM | POA: Insufficient documentation

## 2017-05-16 DIAGNOSIS — Z7901 Long term (current) use of anticoagulants: Secondary | ICD-10-CM | POA: Insufficient documentation

## 2017-05-16 DIAGNOSIS — I480 Paroxysmal atrial fibrillation: Secondary | ICD-10-CM | POA: Diagnosis not present

## 2017-05-16 DIAGNOSIS — R404 Transient alteration of awareness: Secondary | ICD-10-CM | POA: Diagnosis not present

## 2017-05-16 DIAGNOSIS — E7800 Pure hypercholesterolemia, unspecified: Secondary | ICD-10-CM | POA: Diagnosis not present

## 2017-05-16 DIAGNOSIS — M6281 Muscle weakness (generalized): Secondary | ICD-10-CM | POA: Diagnosis not present

## 2017-05-16 LAB — CBC WITH DIFFERENTIAL/PLATELET
BASOS ABS: 0 10*3/uL (ref 0.0–0.1)
BASOS PCT: 0 %
EOS ABS: 0.1 10*3/uL (ref 0.0–0.7)
Eosinophils Relative: 1 %
HCT: 43.2 % (ref 39.0–52.0)
HEMOGLOBIN: 13.7 g/dL (ref 13.0–17.0)
LYMPHS ABS: 0.7 10*3/uL (ref 0.7–4.0)
Lymphocytes Relative: 7 %
MCH: 30 pg (ref 26.0–34.0)
MCHC: 31.7 g/dL (ref 30.0–36.0)
MCV: 94.5 fL (ref 78.0–100.0)
Monocytes Absolute: 0.5 10*3/uL (ref 0.1–1.0)
Monocytes Relative: 5 %
NEUTROS PCT: 87 %
Neutro Abs: 9 10*3/uL — ABNORMAL HIGH (ref 1.7–7.7)
PLATELETS: 157 10*3/uL (ref 150–400)
RBC: 4.57 MIL/uL (ref 4.22–5.81)
RDW: 14.3 % (ref 11.5–15.5)
WBC: 10.3 10*3/uL (ref 4.0–10.5)

## 2017-05-16 LAB — BASIC METABOLIC PANEL
ANION GAP: 7 (ref 5–15)
BUN: 19 mg/dL (ref 6–20)
CHLORIDE: 106 mmol/L (ref 101–111)
CO2: 24 mmol/L (ref 22–32)
Calcium: 8.6 mg/dL — ABNORMAL LOW (ref 8.9–10.3)
Creatinine, Ser: 1.82 mg/dL — ABNORMAL HIGH (ref 0.61–1.24)
GFR, EST AFRICAN AMERICAN: 36 mL/min — AB (ref >60)
GFR, EST NON AFRICAN AMERICAN: 31 mL/min — AB (ref >60)
Glucose, Bld: 187 mg/dL — ABNORMAL HIGH (ref 65–99)
POTASSIUM: 4.4 mmol/L (ref 3.5–5.1)
SODIUM: 137 mmol/L (ref 135–145)

## 2017-05-16 LAB — I-STAT CG4 LACTIC ACID, ED: Lactic Acid, Venous: 1.56 mmol/L (ref 0.5–1.9)

## 2017-05-16 LAB — I-STAT TROPONIN, ED: TROPONIN I, POC: 0.01 ng/mL (ref 0.00–0.08)

## 2017-05-16 MED ORDER — SODIUM CHLORIDE 0.9 % IV BOLUS (SEPSIS)
500.0000 mL | Freq: Once | INTRAVENOUS | Status: AC
  Administered 2017-05-16: 500 mL via INTRAVENOUS

## 2017-05-16 NOTE — ED Triage Notes (Signed)
Per EMS lives at home with wife 3:30 c/o weakness and dizziness, 1 episode of vomiting, A+Ox4

## 2017-05-16 NOTE — ED Provider Notes (Signed)
[facility] EMERGENCY DEPARTMENT Provider Note   CSN: [id number] Arrival date & time: 05/16/17  2039     History   Chief Complaint Chief Complaint  Patient presents with  . Weakness    HPI Victor Wallace is a 83 y.o. male.  The history is provided by the patient and medical records.  Weakness     83 y.o. M with hx of CAD, GERD, HLP, HTN, CKD stage III, DM2, presenting to the ED for generalized weakness.  States today he got up from his chair and just noticed that he felt overall weak.  States he did have some lightheadedness today with standing.  He has not had any falls or syncopal events.  He denies any fever but has had a slight cough.  No sick contacts.  Did have one episode of vomiting today.  Denies any abdominal pain or diarrhea.  Wife reports overall his appetite is usually poor, but he is eating even less today.  States usually he will have insurer or protein shakes.  Patient states he is not really had any fluids at all today.  EMS reported his blood pressure was low in the 65K systolic, patient reports it is not back abnormal for him as his blood pressure is usually in the 812'X systolic range.  He denies any chest pain or shortness of breath.  No changes in his medications.  He did receive 500 cc bolus with EMS.  Past Medical History:  Diagnosis Date  . CAD (coronary artery disease), native coronary artery    Catheterization 2008 showing moderate nonobstructive disease in LAD, circumflex and right coronary artery, no lesions greater than 40%.  EF of 35% at that time.   Marland Kitchen GERD (gastroesophageal reflux disease)   . History of kidney stones   . Hypercholesterolemia   . Hyperlipidemia 12/02/2008  . Hypertension   . Kidney disease, chronic, stage III (GFR 30-59 ml/min) ([place]) 08/29/2014  . Paroxysmal atrial fibrillation (HCC) 08/29/2014   Chads2VASC score 5   . Transient global amnesia 2008   . Type 2 diabetes mellitus with renal manifestations,  controlled [facility])     Patient Active Problem List   Diagnosis Date Noted  . UTI (urinary tract infection) 04/29/2015  . SIRS (systemic inflammatory response syndrome) ([place]) 04/29/2015  . Bronchitis, chronic obstructive w acute bronchitis ([place]) 04/29/2015  . Arterial hypotension   . Hypotension 04/28/2015  . Chronic anticoagulation 09/21/2014  . Kidney disease, chronic, stage III (GFR 30-59 ml/min) (HCC) 08/29/2014  . Paroxysmal atrial fibrillation ([place]) 08/29/2014  . Hypertension 08/29/2014  . Unstable angina pectoris ([place]) 08/29/2014  . History of kidney stones   . GERD (gastroesophageal reflux disease) 01/31/2012  . Type 2 diabetes mellitus with renal manifestations, controlled ([place])   . Hyperlipidemia 12/02/2008  . CAD (coronary artery disease), native coronary artery     Past Surgical History:  Procedure Laterality Date  . CYSTOSCOPY W/ URETERAL STENT PLACEMENT Left 03/06/2013   Procedure: CYSTOSCOPY WITH RETROGRADE PYELOGRAM/URETERAL STENT PLACEMENT;  Surgeon: Malka So, MD;  Location: [facility] ORS;  Service: Urology;  Laterality: Left;  . JOINT REPLACEMENT    . TONSILLECTOMY    . TOTAL HIP ARTHROPLASTY Right        Home Medications    Prior to Admission medications   Medication Sig Start Date End Date Taking? Authorizing Provider  albuterol (PROVENTIL HFA;VENTOLIN HFA) 108 (90 BASE) MCG/ACT inhaler Inhale 2 puffs into the lungs every 6 (six) hours as needed for wheezing  or shortness of breath. 04/30/15   Reyne Dumas, MD  aspirin EC 81 MG tablet Take 1 tablet (81 mg total) by mouth daily. Patient taking differently: Take 162 mg by mouth at bedtime.  08/31/14   Lyda Jester M, PA-C  atorvastatin (LIPITOR) 40 MG tablet Take 40 mg by mouth daily.    [provider]  Calcium Carbonate-Vitamin D (CALCIUM-VITAMIN D) 500-200 MG-UNIT per tablet Take 1 tablet by mouth daily.    [provider]  cycloSPORINE (RESTASIS) 0.05 % ophthalmic emulsion Place 1 drop  into both eyes See admin instructions. left eye in the morning and right eye in the evening    [provider]  docusate sodium (COLACE) 100 MG capsule Take 100 mg by mouth at bedtime.    [provider]  ELIQUIS 2.5 MG TABS tablet TAKE 1 TABLET TWICE A DAY (NEED TO SCHEDULE MD VISIT FOR FURTHER REFILLS) 05/03/16   Bensimhon, Shaune Pascal, MD  fish oil-omega-3 fatty acids 1000 MG capsule Take 1 capsule (1 g total) by mouth daily. Stop taking if having diarrhea 02/05/12   Rai, Ripudeep K, MD  fluticasone (FLONASE) 50 MCG/ACT nasal spray Place 2 sprays into both nostrils daily. Patient taking differently: Place 2 sprays into both nostrils daily as needed for allergies.  04/30/15   Reyne Dumas, MD  Ginkgo Biloba (GINKOBA PO) Take 60 mg by mouth 2 (two) times daily.    [provider]  guaiFENesin-codeine 100-10 MG/5ML syrup Take 5 mLs by mouth every 4 (four) hours as needed for cough. 04/30/15   Reyne Dumas, MD  insulin aspart (NOVOLOG) 100 UNIT/ML injection Inject 0-15 Units into the skin 4 (four) times daily -  with meals and at bedtime. Sliding scale, 70-120-0 units, 121-150 2 units, 151-200 3 units, 201-250 3 units 251-300 8 units 301-350 11 units, 351-400 15 units, beyond call dr    [provider]  insulin detemir (LEVEMIR) 100 UNIT/ML injection Inject 23 Units into the skin at bedtime.     [provider]  isosorbide mononitrate (IMDUR) 30 MG 24 hr tablet Take 30 mg by mouth daily.    [provider]  Lifitegrast Shirley Friar) 5 % SOLN Place 1 drop into both eyes daily.    [provider]  linagliptin (TRADJENTA) 5 MG TABS tablet Take 1 tablet (5 mg total) by mouth daily. 02/05/12   Rai, Vernelle Emerald, MD  metoprolol tartrate (LOPRESSOR) 25 MG tablet Take 1 tablet (25 mg total) by mouth 2 (two) times daily. 07/15/15   Lyda Jester M, PA-C  Multiple Vitamins-Minerals (MULTIVITAMIN WITH MINERALS) tablet Take 1 tablet by mouth daily.     [provider]  Multiple Vitamins-Minerals (PRESERVISION AREDS 2) CAPS Take 1 capsule by mouth 2 (two) times daily.     [provider]  nitroGLYCERIN (NITROSTAT) 0.4 MG SL tablet Place 1 tablet (0.4 mg total) under the tongue every 5 (five) minutes x 3 doses as needed for chest pain. 08/31/14   Lyda Jester M, PA-C  omeprazole (PRILOSEC) 20 MG capsule Take 20 mg by mouth daily.     [provider]  polyethylene glycol (MIRALAX / GLYCOLAX) packet Take 17 g by mouth 2 (two) times daily as needed (for constipation).     [provider]  Pyridoxine HCl (VITAMIN B-6) 500 MG tablet Take 100 mg by mouth 2 (two) times daily.     [provider]  timolol (BETIMOL) 0.5 % ophthalmic solution Place 1 drop into the left  eye daily.     [provider]  vitamin B-12 (CYANOCOBALAMIN) 1000 MCG tablet Take 1,000 mcg by mouth daily.    [provider]    Family History Family History  Problem Relation Age of Onset  . Heart attack Father   . Emphysema Brother     Social History Social History   Tobacco Use  . Smoking status: Never Smoker  Substance Use Topics  . Alcohol use: Yes    Alcohol/week: 0.6 oz    Types: 1 Cans of beer per week    Comment: occasionally  . Drug use: No     Allergies   Patient has no known allergies.   Review of Systems Review of Systems  Respiratory: Positive for cough.   Neurological: Positive for weakness.  All other systems reviewed and are negative.    Physical Exam Updated Vital Signs Ht $Remove'6\' 1"'xFhWEkp$  (1.854 m)   Wt 90.7 kg (200 lb)   SpO2 96%   BMI 26.39 kg/m   Physical Exam  Constitutional: He is oriented to person, place, and time. He appears well-developed and well-nourished.  Elderly but nontoxic in appearance  HENT:  Head: Normocephalic and atraumatic.  Mouth/Throat: Oropharynx is clear and moist.  Eyes: Conjunctivae and EOM are normal. Pupils are equal, round, and reactive to light.  Neck:  Normal range of motion.  Cardiovascular: Normal rate, regular rhythm and normal heart sounds.  Pulmonary/Chest: Effort normal and breath sounds normal.  Abdominal: Soft. Bowel sounds are normal.  Musculoskeletal: Normal range of motion.  Neurological: He is alert and oriented to person, place, and time.  AAOx3, answering questions and following commands appropriately; equal strength UE bilaterally, RLE weaker than LLE which is baseline (pt reports R hip replacement); CN grossly intact; moves all extremities appropriately without ataxia; no focal neuro deficits or facial asymmetry appreciated  Skin: Skin is warm and dry.  Psychiatric: He has a normal mood and affect.  Nursing note and vitals reviewed.    ED Treatments / Results  Labs (all labs ordered are listed, but only abnormal results are displayed) Labs Reviewed  CBC WITH DIFFERENTIAL/PLATELET - Abnormal; Notable for the following components:      Result Value   Neutro Abs 9.0 (*)    All other components within normal limits  BASIC METABOLIC PANEL - Abnormal; Notable for the following components:   Glucose, Bld 187 (*)    Creatinine, Ser 1.82 (*)    Calcium 8.6 (*)    GFR calc non Af Amer 31 (*)    GFR calc Af Amer 36 (*)    All other components within normal limits  URINALYSIS, ROUTINE W REFLEX MICROSCOPIC - Abnormal; Notable for the following components:   Color, Urine AMBER (*)    APPearance TURBID (*)    Ketones, ur 20 (*)    Protein, ur 100 (*)    Leukocytes, UA LARGE (*)    Bacteria, UA MANY (*)    All other components within normal limits  URINE CULTURE  I-STAT TROPONIN, ED  I-STAT CG4 LACTIC ACID, ED    EKG  EKG Interpretation  Date/Time:  Thursday May 16 2017 20:49:49 EST Ventricular Rate:  90 PR Interval:    QRS Duration: 134 QT Interval:  383 QTC Calculation: 469 R Axis:   -69 Text Interpretation:  Sinus rhythm RBBB and LAFB No significant change since last tracing Confirmed by Gareth Morgan  [phone]) on 05/16/2017 11:30:37 PM       Radiology Dg Chest  2 View  Result Date: 05/16/2017 CLINICAL DATA:  Weakness with nausea and vomiting EXAM: CHEST  2 VIEW COMPARISON:  04/28/2015 FINDINGS: The heart size and mediastinal contours are within normal limits. Both lungs are clear. The visualized skeletal structures are unremarkable. IMPRESSION: No active cardiopulmonary disease. Electronically Signed   By: Donavan Foil M.D.   On: 05/16/2017 21:22    Procedures Procedures (including critical care time)  Medications Ordered in ED Medications  cephALEXin (KEFLEX) capsule 500 mg (not administered)  sodium chloride 0.9 % bolus 500 mL (0 mLs Intravenous Stopped 05/16/17 2303)     Initial Impression / Assessment and Plan / ED Course  I have reviewed the triage vital signs and the nursing notes.  Pertinent labs & imaging results that were available during my care of the patient were reviewed by me and considered in my medical decision making (see chart for details).  83 y.o. M here with generalized weakness beginning today.  No infectious symptoms reported but does appear to have dry cough on exam.  Afebrile, non-toxic in appearance.  No focal deficits (baseline weakness of right leg noted).  Admits to poor oral intake today.  Given 500cc bolus with EMS.  Will plan for screening labs, CXR, UA.  Orthostatics will be obtained.  Mild orthostasis noted with standing, additional 500cc bolus given.  Patient also drinking water at this time.  Labs thus far reassuring with normal WBC count, normal lactate.  CXR clear.   Awaiting UA.  UA appears infectious.  Culture pending.  Discussed results with patient and daughter at bedside.  Patient desires to go home and feels comfortable managing his symptoms on his own.  We discussed to increase oral hydration and eating regularly scheduled meals at home.  Will start keflex pending urine culture, first dose given here.  Will have him follow-up closely with  PCP.  Discussed plan with patient, he acknowledged understanding and agreed with plan of care.  Return precautions given for new or worsening symptoms.  Patient seen and evaluated with attending physician, Dr. Billy Fischer.  Final Clinical Impressions(s) / ED Diagnoses   Final diagnoses:  Weakness  Acute cystitis without hematuria    ED Discharge Orders        Ordered    cephALEXin (KEFLEX) 500 MG capsule  2 times daily     05/17/17 0059       Larene Pickett, PA-C 05/17/17 [id number]    Gareth Morgan, MD 05/19/17 1753

## 2017-05-16 NOTE — ED Notes (Signed)
Urine was accentually clicked off by myself. Urine has not been collected at this time.

## 2017-05-17 DIAGNOSIS — N3000 Acute cystitis without hematuria: Secondary | ICD-10-CM | POA: Diagnosis not present

## 2017-05-17 LAB — URINALYSIS, ROUTINE W REFLEX MICROSCOPIC
Bilirubin Urine: NEGATIVE
Glucose, UA: NEGATIVE mg/dL
HGB URINE DIPSTICK: NEGATIVE
Ketones, ur: 20 mg/dL — AB
Nitrite: NEGATIVE
Protein, ur: 100 mg/dL — AB
SPECIFIC GRAVITY, URINE: 1.017 (ref 1.005–1.030)
SQUAMOUS EPITHELIAL / LPF: NONE SEEN
pH: 6 (ref 5.0–8.0)

## 2017-05-17 MED ORDER — CEPHALEXIN 250 MG PO CAPS
500.0000 mg | ORAL_CAPSULE | Freq: Once | ORAL | Status: AC
  Administered 2017-05-17: 500 mg via ORAL
  Filled 2017-05-17: qty 2

## 2017-05-17 MED ORDER — CEPHALEXIN 500 MG PO CAPS: 500.0000 mg | ORAL_CAPSULE | Freq: Two times a day (BID) | ORAL | 0 refills | Status: DC

## 2017-05-17 NOTE — Discharge Instructions (Signed)
Your labs today did show a urinary tract infection as well as some mild dehydration. We are starting you on keflex (antibiotic) for the infection.  Please take this as directed until whole course is finished. Make sure to drink plenty of fluids and eat regular meals. Follow-up with your primary care doctor. Return here for any new/worsening symptoms.

## 2017-05-19 LAB — URINE CULTURE

## 2017-05-20 ENCOUNTER — Telehealth: Payer: Self-pay | Admitting: Emergency Medicine

## 2017-05-20 NOTE — Telephone Encounter (Signed)
Post ED Visit - Positive Culture Follow-up: Successful Patient Follow-Up  Culture assessed and recommendations reviewed by: []  Elenor Quinones, Pharm.D. []  Heide Guile, Pharm.D., BCPS AQ-ID []  Parks Neptune, Pharm.D., BCPS []  Alycia Rossetti, Pharm.D., BCPS []  [place], [place].D., BCPS, AAHIVP []  Legrand Como, Pharm.D., BCPS, AAHIVP []  Salome Arnt, PharmD, BCPS []  Dimitri Ped, PharmD, BCPS []  Vincenza Hews, PharmD, BCPS  Positive urine culture  []  Patient discharged without antimicrobial prescription and treatment is now indicated [x]  Organism is resistant to prescribed ED discharge antimicrobial []  Patient with positive blood cultures  Changes discussed with ED provider: Wyn Quaker PA New antibiotic prescription stop cephalexin, start Amoxicillin 500mg  po bid x 7 days Called to [facility] @ [phone]  Contacted patient, 05/20/17 1244  Hazle Nordmann 05/20/2017, 12:44 PM

## 2017-05-20 NOTE — Progress Notes (Signed)
ED Antimicrobial Stewardship Positive Culture Follow Up   Victor Wallace is an 82 y.o. male who presented to [facility] on 05/16/2017 with a chief complaint of  Chief Complaint  Patient presents with  . Weakness    Recent Results (from the past 720 hour(s))  Urine culture     Status: Abnormal   Collection Time: 05/16/17  8:54 PM  Result Value Ref Range Status   Specimen Description URINE, RANDOM  Final   Special Requests NONE  Final   Culture >=100,000 COLONIES/mL ENTEROCOCCUS FAECALIS (A)  Final   Report Status 05/19/2017 FINAL  Final   Organism ID, Bacteria ENTEROCOCCUS FAECALIS (A)  Final      Susceptibility   Enterococcus faecalis - MIC*    AMPICILLIN <=2 SENSITIVE Sensitive     LEVOFLOXACIN 1 SENSITIVE Sensitive     NITROFURANTOIN <=16 SENSITIVE Sensitive     VANCOMYCIN 1 SENSITIVE Sensitive     * >=100,000 COLONIES/mL ENTEROCOCCUS FAECALIS    '[x]'$  Treated with cephalexin, organism resistant to prescribed antimicrobial   Patient presented from home with weakness/dizziness, found with low blood pressure, mild dehydration and positive UA. No signs of systemic infection.   New antibiotic prescription: Stop cephalexin. Start amoxicillin 500 mg PO BID x 7 days  ED Provider: Loann Quill, PharmD PGY1 Pharmacy Resident ID pharmacist phone # [phone] 05/20/17 9:31 AM

## 2017-05-28 DIAGNOSIS — R531 Weakness: Secondary | ICD-10-CM | POA: Diagnosis not present

## 2017-05-28 DIAGNOSIS — Z6827 Body mass index (BMI) 27.0-27.9, adult: Secondary | ICD-10-CM | POA: Diagnosis not present

## 2017-05-28 DIAGNOSIS — R309 Painful micturition, unspecified: Secondary | ICD-10-CM | POA: Diagnosis not present

## 2017-06-04 DIAGNOSIS — E1151 Type 2 diabetes mellitus with diabetic peripheral angiopathy without gangrene: Secondary | ICD-10-CM | POA: Diagnosis not present

## 2017-06-04 DIAGNOSIS — I480 Paroxysmal atrial fibrillation: Secondary | ICD-10-CM | POA: Diagnosis not present

## 2017-06-04 DIAGNOSIS — J189 Pneumonia, unspecified organism: Secondary | ICD-10-CM | POA: Diagnosis not present

## 2017-06-04 DIAGNOSIS — R5381 Other malaise: Secondary | ICD-10-CM | POA: Diagnosis not present

## 2017-06-04 DIAGNOSIS — R05 Cough: Secondary | ICD-10-CM | POA: Diagnosis not present

## 2017-06-18 DIAGNOSIS — I480 Paroxysmal atrial fibrillation: Secondary | ICD-10-CM | POA: Diagnosis not present

## 2017-06-18 DIAGNOSIS — J189 Pneumonia, unspecified organism: Secondary | ICD-10-CM | POA: Diagnosis not present

## 2017-06-18 DIAGNOSIS — R05 Cough: Secondary | ICD-10-CM | POA: Diagnosis not present

## 2017-06-27 DIAGNOSIS — E1151 Type 2 diabetes mellitus with diabetic peripheral angiopathy without gangrene: Secondary | ICD-10-CM | POA: Diagnosis not present

## 2017-06-27 DIAGNOSIS — R82998 Other abnormal findings in urine: Secondary | ICD-10-CM | POA: Diagnosis not present

## 2017-06-27 DIAGNOSIS — N390 Urinary tract infection, site not specified: Secondary | ICD-10-CM | POA: Diagnosis not present

## 2017-06-27 DIAGNOSIS — Z125 Encounter for screening for malignant neoplasm of prostate: Secondary | ICD-10-CM | POA: Diagnosis not present

## 2017-06-27 DIAGNOSIS — N184 Chronic kidney disease, stage 4 (severe): Secondary | ICD-10-CM | POA: Diagnosis not present

## 2017-06-27 DIAGNOSIS — E7800 Pure hypercholesterolemia, unspecified: Secondary | ICD-10-CM | POA: Diagnosis not present

## 2017-07-04 DIAGNOSIS — R309 Painful micturition, unspecified: Secondary | ICD-10-CM | POA: Diagnosis not present

## 2017-07-04 DIAGNOSIS — I2510 Atherosclerotic heart disease of native coronary artery without angina pectoris: Secondary | ICD-10-CM | POA: Diagnosis not present

## 2017-07-04 DIAGNOSIS — Z1389 Encounter for screening for other disorder: Secondary | ICD-10-CM | POA: Diagnosis not present

## 2017-07-04 DIAGNOSIS — N184 Chronic kidney disease, stage 4 (severe): Secondary | ICD-10-CM | POA: Diagnosis not present

## 2017-07-04 DIAGNOSIS — Z6827 Body mass index (BMI) 27.0-27.9, adult: Secondary | ICD-10-CM | POA: Diagnosis not present

## 2017-07-04 DIAGNOSIS — Z0000 Encounter for general adult medical examination without abnormal findings: Secondary | ICD-10-CM | POA: Diagnosis not present

## 2017-07-04 DIAGNOSIS — G6289 Other specified polyneuropathies: Secondary | ICD-10-CM | POA: Diagnosis not present

## 2017-07-04 DIAGNOSIS — K219 Gastro-esophageal reflux disease without esophagitis: Secondary | ICD-10-CM | POA: Diagnosis not present

## 2017-07-04 DIAGNOSIS — E1151 Type 2 diabetes mellitus with diabetic peripheral angiopathy without gangrene: Secondary | ICD-10-CM | POA: Diagnosis not present

## 2017-07-04 DIAGNOSIS — E7800 Pure hypercholesterolemia, unspecified: Secondary | ICD-10-CM | POA: Diagnosis not present

## 2017-07-04 DIAGNOSIS — E1129 Type 2 diabetes mellitus with other diabetic kidney complication: Secondary | ICD-10-CM | POA: Diagnosis not present

## 2017-07-04 DIAGNOSIS — N390 Urinary tract infection, site not specified: Secondary | ICD-10-CM | POA: Diagnosis not present

## 2017-07-04 DIAGNOSIS — N401 Enlarged prostate with lower urinary tract symptoms: Secondary | ICD-10-CM | POA: Diagnosis not present

## 2017-07-11 DIAGNOSIS — D649 Anemia, unspecified: Secondary | ICD-10-CM | POA: Diagnosis not present

## 2017-07-11 DIAGNOSIS — N2581 Secondary hyperparathyroidism of renal origin: Secondary | ICD-10-CM | POA: Diagnosis not present

## 2017-07-11 DIAGNOSIS — I10 Essential (primary) hypertension: Secondary | ICD-10-CM | POA: Diagnosis not present

## 2017-07-11 DIAGNOSIS — N189 Chronic kidney disease, unspecified: Secondary | ICD-10-CM | POA: Diagnosis not present

## 2017-07-15 DIAGNOSIS — Z1212 Encounter for screening for malignant neoplasm of rectum: Secondary | ICD-10-CM | POA: Diagnosis not present

## 2017-07-16 DIAGNOSIS — H353131 Nonexudative age-related macular degeneration, bilateral, early dry stage: Secondary | ICD-10-CM | POA: Diagnosis not present

## 2017-07-16 DIAGNOSIS — Z961 Presence of intraocular lens: Secondary | ICD-10-CM | POA: Diagnosis not present

## 2017-07-16 DIAGNOSIS — Z9849 Cataract extraction status, unspecified eye: Secondary | ICD-10-CM | POA: Diagnosis not present

## 2017-07-16 DIAGNOSIS — H47232 Glaucomatous optic atrophy, left eye: Secondary | ICD-10-CM | POA: Diagnosis not present

## 2017-07-16 DIAGNOSIS — H33309 Unspecified retinal break, unspecified eye: Secondary | ICD-10-CM | POA: Diagnosis not present

## 2017-07-16 DIAGNOSIS — H40011 Open angle with borderline findings, low risk, right eye: Secondary | ICD-10-CM | POA: Diagnosis not present

## 2017-07-16 DIAGNOSIS — H401121 Primary open-angle glaucoma, left eye, mild stage: Secondary | ICD-10-CM | POA: Diagnosis not present

## 2017-10-14 DIAGNOSIS — Z794 Long term (current) use of insulin: Secondary | ICD-10-CM | POA: Diagnosis not present

## 2017-10-14 DIAGNOSIS — H353131 Nonexudative age-related macular degeneration, bilateral, early dry stage: Secondary | ICD-10-CM | POA: Diagnosis not present

## 2017-10-14 DIAGNOSIS — H401121 Primary open-angle glaucoma, left eye, mild stage: Secondary | ICD-10-CM | POA: Diagnosis not present

## 2017-10-14 DIAGNOSIS — E119 Type 2 diabetes mellitus without complications: Secondary | ICD-10-CM | POA: Diagnosis not present

## 2017-10-14 DIAGNOSIS — Z7984 Long term (current) use of oral hypoglycemic drugs: Secondary | ICD-10-CM | POA: Diagnosis not present

## 2017-10-14 DIAGNOSIS — H47232 Glaucomatous optic atrophy, left eye: Secondary | ICD-10-CM | POA: Diagnosis not present

## 2017-12-31 DIAGNOSIS — N184 Chronic kidney disease, stage 4 (severe): Secondary | ICD-10-CM | POA: Diagnosis not present

## 2017-12-31 DIAGNOSIS — I208 Other forms of angina pectoris: Secondary | ICD-10-CM | POA: Diagnosis not present

## 2017-12-31 DIAGNOSIS — Z6826 Body mass index (BMI) 26.0-26.9, adult: Secondary | ICD-10-CM | POA: Diagnosis not present

## 2017-12-31 DIAGNOSIS — I480 Paroxysmal atrial fibrillation: Secondary | ICD-10-CM | POA: Diagnosis not present

## 2017-12-31 DIAGNOSIS — N401 Enlarged prostate with lower urinary tract symptoms: Secondary | ICD-10-CM | POA: Diagnosis not present

## 2017-12-31 DIAGNOSIS — G6289 Other specified polyneuropathies: Secondary | ICD-10-CM | POA: Diagnosis not present

## 2017-12-31 DIAGNOSIS — I2510 Atherosclerotic heart disease of native coronary artery without angina pectoris: Secondary | ICD-10-CM | POA: Diagnosis not present

## 2017-12-31 DIAGNOSIS — E1151 Type 2 diabetes mellitus with diabetic peripheral angiopathy without gangrene: Secondary | ICD-10-CM | POA: Diagnosis not present

## 2017-12-31 DIAGNOSIS — Z7901 Long term (current) use of anticoagulants: Secondary | ICD-10-CM | POA: Diagnosis not present

## 2017-12-31 DIAGNOSIS — E7800 Pure hypercholesterolemia, unspecified: Secondary | ICD-10-CM | POA: Diagnosis not present

## 2017-12-31 DIAGNOSIS — R309 Painful micturition, unspecified: Secondary | ICD-10-CM | POA: Diagnosis not present

## 2017-12-31 DIAGNOSIS — N35911 Unspecified urethral stricture, male, meatal: Secondary | ICD-10-CM | POA: Diagnosis not present

## 2017-12-31 DIAGNOSIS — N390 Urinary tract infection, site not specified: Secondary | ICD-10-CM | POA: Diagnosis not present

## 2017-12-31 DIAGNOSIS — N2581 Secondary hyperparathyroidism of renal origin: Secondary | ICD-10-CM | POA: Diagnosis not present

## 2018-01-14 DIAGNOSIS — H401221 Low-tension glaucoma, left eye, mild stage: Secondary | ICD-10-CM | POA: Diagnosis not present

## 2018-01-14 DIAGNOSIS — H04123 Dry eye syndrome of bilateral lacrimal glands: Secondary | ICD-10-CM | POA: Diagnosis not present

## 2018-01-14 DIAGNOSIS — H11153 Pinguecula, bilateral: Secondary | ICD-10-CM | POA: Diagnosis not present

## 2018-01-14 DIAGNOSIS — H0100A Unspecified blepharitis right eye, upper and lower eyelids: Secondary | ICD-10-CM | POA: Diagnosis not present

## 2018-01-14 DIAGNOSIS — S0500XA Injury of conjunctiva and corneal abrasion without foreign body, unspecified eye, initial encounter: Secondary | ICD-10-CM | POA: Diagnosis not present

## 2018-01-14 DIAGNOSIS — H18413 Arcus senilis, bilateral: Secondary | ICD-10-CM | POA: Diagnosis not present

## 2018-01-14 DIAGNOSIS — H353131 Nonexudative age-related macular degeneration, bilateral, early dry stage: Secondary | ICD-10-CM | POA: Diagnosis not present

## 2018-01-14 DIAGNOSIS — H47232 Glaucomatous optic atrophy, left eye: Secondary | ICD-10-CM | POA: Diagnosis not present

## 2018-01-14 DIAGNOSIS — H11423 Conjunctival edema, bilateral: Secondary | ICD-10-CM | POA: Diagnosis not present

## 2018-01-14 DIAGNOSIS — H0100B Unspecified blepharitis left eye, upper and lower eyelids: Secondary | ICD-10-CM | POA: Diagnosis not present

## 2018-01-14 DIAGNOSIS — H35033 Hypertensive retinopathy, bilateral: Secondary | ICD-10-CM | POA: Diagnosis not present

## 2018-01-14 DIAGNOSIS — H353132 Nonexudative age-related macular degeneration, bilateral, intermediate dry stage: Secondary | ICD-10-CM | POA: Diagnosis not present

## 2018-02-03 ENCOUNTER — Encounter (INDEPENDENT_AMBULATORY_CARE_PROVIDER_SITE_OTHER): Payer: Medicare Other | Admitting: Ophthalmology

## 2018-02-03 DIAGNOSIS — H35033 Hypertensive retinopathy, bilateral: Secondary | ICD-10-CM | POA: Diagnosis not present

## 2018-02-03 DIAGNOSIS — I10 Essential (primary) hypertension: Secondary | ICD-10-CM | POA: Diagnosis not present

## 2018-02-03 DIAGNOSIS — H353132 Nonexudative age-related macular degeneration, bilateral, intermediate dry stage: Secondary | ICD-10-CM

## 2018-02-03 DIAGNOSIS — H33303 Unspecified retinal break, bilateral: Secondary | ICD-10-CM | POA: Diagnosis not present

## 2018-02-03 DIAGNOSIS — H43813 Vitreous degeneration, bilateral: Secondary | ICD-10-CM

## 2018-04-17 DIAGNOSIS — H47233 Glaucomatous optic atrophy, bilateral: Secondary | ICD-10-CM | POA: Diagnosis not present

## 2018-04-17 DIAGNOSIS — H401231 Low-tension glaucoma, bilateral, mild stage: Secondary | ICD-10-CM | POA: Diagnosis not present

## 2018-04-17 DIAGNOSIS — H52223 Regular astigmatism, bilateral: Secondary | ICD-10-CM | POA: Diagnosis not present

## 2018-04-17 DIAGNOSIS — H11823 Conjunctivochalasis, bilateral: Secondary | ICD-10-CM | POA: Diagnosis not present

## 2018-04-17 DIAGNOSIS — H18413 Arcus senilis, bilateral: Secondary | ICD-10-CM | POA: Diagnosis not present

## 2018-04-17 DIAGNOSIS — H11153 Pinguecula, bilateral: Secondary | ICD-10-CM | POA: Diagnosis not present

## 2018-04-17 DIAGNOSIS — E119 Type 2 diabetes mellitus without complications: Secondary | ICD-10-CM | POA: Diagnosis not present

## 2018-04-17 DIAGNOSIS — N2581 Secondary hyperparathyroidism of renal origin: Secondary | ICD-10-CM | POA: Diagnosis not present

## 2018-04-17 DIAGNOSIS — N189 Chronic kidney disease, unspecified: Secondary | ICD-10-CM | POA: Diagnosis not present

## 2018-04-17 DIAGNOSIS — H5201 Hypermetropia, right eye: Secondary | ICD-10-CM | POA: Diagnosis not present

## 2018-04-17 DIAGNOSIS — H04123 Dry eye syndrome of bilateral lacrimal glands: Secondary | ICD-10-CM | POA: Diagnosis not present

## 2018-04-17 DIAGNOSIS — Z9849 Cataract extraction status, unspecified eye: Secondary | ICD-10-CM | POA: Diagnosis not present

## 2018-04-17 DIAGNOSIS — H353131 Nonexudative age-related macular degeneration, bilateral, early dry stage: Secondary | ICD-10-CM | POA: Diagnosis not present

## 2018-04-17 DIAGNOSIS — Z961 Presence of intraocular lens: Secondary | ICD-10-CM | POA: Diagnosis not present

## 2018-04-21 DIAGNOSIS — N189 Chronic kidney disease, unspecified: Secondary | ICD-10-CM | POA: Diagnosis not present

## 2018-04-21 DIAGNOSIS — D649 Anemia, unspecified: Secondary | ICD-10-CM | POA: Diagnosis not present

## 2018-04-21 DIAGNOSIS — Z23 Encounter for immunization: Secondary | ICD-10-CM | POA: Diagnosis not present

## 2018-04-21 DIAGNOSIS — I10 Essential (primary) hypertension: Secondary | ICD-10-CM | POA: Diagnosis not present

## 2018-04-21 DIAGNOSIS — N2581 Secondary hyperparathyroidism of renal origin: Secondary | ICD-10-CM | POA: Diagnosis not present

## 2018-07-03 DIAGNOSIS — E1129 Type 2 diabetes mellitus with other diabetic kidney complication: Secondary | ICD-10-CM | POA: Diagnosis not present

## 2018-07-03 DIAGNOSIS — E7800 Pure hypercholesterolemia, unspecified: Secondary | ICD-10-CM | POA: Diagnosis not present

## 2018-07-03 DIAGNOSIS — N184 Chronic kidney disease, stage 4 (severe): Secondary | ICD-10-CM | POA: Diagnosis not present

## 2018-07-03 DIAGNOSIS — Z125 Encounter for screening for malignant neoplasm of prostate: Secondary | ICD-10-CM | POA: Diagnosis not present

## 2018-07-03 DIAGNOSIS — N390 Urinary tract infection, site not specified: Secondary | ICD-10-CM | POA: Diagnosis not present

## 2018-07-03 DIAGNOSIS — R82998 Other abnormal findings in urine: Secondary | ICD-10-CM | POA: Diagnosis not present

## 2018-07-03 DIAGNOSIS — Z0000 Encounter for general adult medical examination without abnormal findings: Secondary | ICD-10-CM | POA: Diagnosis not present

## 2018-07-08 DIAGNOSIS — E1129 Type 2 diabetes mellitus with other diabetic kidney complication: Secondary | ICD-10-CM | POA: Diagnosis not present

## 2018-07-08 DIAGNOSIS — Z794 Long term (current) use of insulin: Secondary | ICD-10-CM | POA: Diagnosis not present

## 2018-07-08 DIAGNOSIS — N184 Chronic kidney disease, stage 4 (severe): Secondary | ICD-10-CM | POA: Diagnosis not present

## 2018-07-08 DIAGNOSIS — I480 Paroxysmal atrial fibrillation: Secondary | ICD-10-CM | POA: Diagnosis not present

## 2018-07-08 DIAGNOSIS — G6289 Other specified polyneuropathies: Secondary | ICD-10-CM | POA: Diagnosis not present

## 2018-07-08 DIAGNOSIS — Z0000 Encounter for general adult medical examination without abnormal findings: Secondary | ICD-10-CM | POA: Diagnosis not present

## 2018-07-08 DIAGNOSIS — I2510 Atherosclerotic heart disease of native coronary artery without angina pectoris: Secondary | ICD-10-CM | POA: Diagnosis not present

## 2018-07-08 DIAGNOSIS — Z1389 Encounter for screening for other disorder: Secondary | ICD-10-CM | POA: Diagnosis not present

## 2018-07-08 DIAGNOSIS — Z6826 Body mass index (BMI) 26.0-26.9, adult: Secondary | ICD-10-CM | POA: Diagnosis not present

## 2018-07-08 DIAGNOSIS — E7800 Pure hypercholesterolemia, unspecified: Secondary | ICD-10-CM | POA: Diagnosis not present

## 2018-07-08 DIAGNOSIS — Z7901 Long term (current) use of anticoagulants: Secondary | ICD-10-CM | POA: Diagnosis not present

## 2018-07-08 DIAGNOSIS — Z1331 Encounter for screening for depression: Secondary | ICD-10-CM | POA: Diagnosis not present

## 2018-07-16 DIAGNOSIS — Z961 Presence of intraocular lens: Secondary | ICD-10-CM | POA: Diagnosis not present

## 2018-07-16 DIAGNOSIS — H353131 Nonexudative age-related macular degeneration, bilateral, early dry stage: Secondary | ICD-10-CM | POA: Diagnosis not present

## 2018-07-16 DIAGNOSIS — H401221 Low-tension glaucoma, left eye, mild stage: Secondary | ICD-10-CM | POA: Diagnosis not present

## 2018-07-16 DIAGNOSIS — H11823 Conjunctivochalasis, bilateral: Secondary | ICD-10-CM | POA: Diagnosis not present

## 2018-07-16 DIAGNOSIS — H18413 Arcus senilis, bilateral: Secondary | ICD-10-CM | POA: Diagnosis not present

## 2018-07-16 DIAGNOSIS — E119 Type 2 diabetes mellitus without complications: Secondary | ICD-10-CM | POA: Diagnosis not present

## 2019-01-09 DIAGNOSIS — E1151 Type 2 diabetes mellitus with diabetic peripheral angiopathy without gangrene: Secondary | ICD-10-CM | POA: Diagnosis not present

## 2019-01-23 DIAGNOSIS — N401 Enlarged prostate with lower urinary tract symptoms: Secondary | ICD-10-CM | POA: Diagnosis not present

## 2019-01-23 DIAGNOSIS — G629 Polyneuropathy, unspecified: Secondary | ICD-10-CM | POA: Diagnosis not present

## 2019-01-23 DIAGNOSIS — I209 Angina pectoris, unspecified: Secondary | ICD-10-CM | POA: Diagnosis not present

## 2019-01-23 DIAGNOSIS — E1129 Type 2 diabetes mellitus with other diabetic kidney complication: Secondary | ICD-10-CM | POA: Diagnosis not present

## 2019-01-23 DIAGNOSIS — N184 Chronic kidney disease, stage 4 (severe): Secondary | ICD-10-CM | POA: Diagnosis not present

## 2019-01-23 DIAGNOSIS — N35919 Unspecified urethral stricture, male, unspecified site: Secondary | ICD-10-CM | POA: Diagnosis not present

## 2019-01-23 DIAGNOSIS — E7800 Pure hypercholesterolemia, unspecified: Secondary | ICD-10-CM | POA: Diagnosis not present

## 2019-01-23 DIAGNOSIS — M25551 Pain in right hip: Secondary | ICD-10-CM | POA: Diagnosis not present

## 2019-01-23 DIAGNOSIS — I480 Paroxysmal atrial fibrillation: Secondary | ICD-10-CM | POA: Diagnosis not present

## 2019-01-23 DIAGNOSIS — Z794 Long term (current) use of insulin: Secondary | ICD-10-CM | POA: Diagnosis not present

## 2019-01-23 DIAGNOSIS — R413 Other amnesia: Secondary | ICD-10-CM | POA: Diagnosis not present

## 2019-01-23 DIAGNOSIS — Z7901 Long term (current) use of anticoagulants: Secondary | ICD-10-CM | POA: Diagnosis not present

## 2019-02-04 ENCOUNTER — Encounter (INDEPENDENT_AMBULATORY_CARE_PROVIDER_SITE_OTHER): Payer: Medicare Other | Admitting: Ophthalmology

## 2019-03-04 ENCOUNTER — Encounter (INDEPENDENT_AMBULATORY_CARE_PROVIDER_SITE_OTHER): Payer: Medicare Other | Admitting: Ophthalmology

## 2019-03-26 ENCOUNTER — Encounter (INDEPENDENT_AMBULATORY_CARE_PROVIDER_SITE_OTHER): Payer: Medicare Other | Admitting: Ophthalmology

## 2019-03-26 ENCOUNTER — Other Ambulatory Visit: Payer: Self-pay

## 2019-03-26 DIAGNOSIS — H33303 Unspecified retinal break, bilateral: Secondary | ICD-10-CM | POA: Diagnosis not present

## 2019-03-26 DIAGNOSIS — H43813 Vitreous degeneration, bilateral: Secondary | ICD-10-CM

## 2019-03-26 DIAGNOSIS — I10 Essential (primary) hypertension: Secondary | ICD-10-CM | POA: Diagnosis not present

## 2019-03-26 DIAGNOSIS — H35033 Hypertensive retinopathy, bilateral: Secondary | ICD-10-CM | POA: Diagnosis not present

## 2019-03-26 DIAGNOSIS — H353132 Nonexudative age-related macular degeneration, bilateral, intermediate dry stage: Secondary | ICD-10-CM | POA: Diagnosis not present

## 2019-05-15 DIAGNOSIS — Z23 Encounter for immunization: Secondary | ICD-10-CM | POA: Diagnosis not present

## 2019-06-03 DIAGNOSIS — E1129 Type 2 diabetes mellitus with other diabetic kidney complication: Secondary | ICD-10-CM | POA: Diagnosis not present

## 2019-06-03 DIAGNOSIS — N184 Chronic kidney disease, stage 4 (severe): Secondary | ICD-10-CM | POA: Diagnosis not present

## 2019-06-03 DIAGNOSIS — R0609 Other forms of dyspnea: Secondary | ICD-10-CM | POA: Diagnosis not present

## 2019-06-03 DIAGNOSIS — I2510 Atherosclerotic heart disease of native coronary artery without angina pectoris: Secondary | ICD-10-CM | POA: Diagnosis not present

## 2019-06-03 DIAGNOSIS — R5381 Other malaise: Secondary | ICD-10-CM | POA: Diagnosis not present

## 2019-06-03 DIAGNOSIS — I480 Paroxysmal atrial fibrillation: Secondary | ICD-10-CM | POA: Diagnosis not present

## 2019-06-03 DIAGNOSIS — M1990 Unspecified osteoarthritis, unspecified site: Secondary | ICD-10-CM | POA: Diagnosis not present

## 2019-06-03 DIAGNOSIS — E1151 Type 2 diabetes mellitus with diabetic peripheral angiopathy without gangrene: Secondary | ICD-10-CM | POA: Diagnosis not present

## 2019-06-03 DIAGNOSIS — M25551 Pain in right hip: Secondary | ICD-10-CM | POA: Diagnosis not present

## 2019-06-03 DIAGNOSIS — R413 Other amnesia: Secondary | ICD-10-CM | POA: Diagnosis not present

## 2019-06-04 ENCOUNTER — Other Ambulatory Visit (HOSPITAL_COMMUNITY): Payer: Self-pay | Admitting: Respiratory Therapy

## 2019-06-04 DIAGNOSIS — R0609 Other forms of dyspnea: Secondary | ICD-10-CM

## 2019-06-15 ENCOUNTER — Other Ambulatory Visit: Payer: Self-pay

## 2019-06-15 DIAGNOSIS — Z20822 Contact with and (suspected) exposure to covid-19: Secondary | ICD-10-CM

## 2019-06-15 DIAGNOSIS — Z20828 Contact with and (suspected) exposure to other viral communicable diseases: Secondary | ICD-10-CM | POA: Diagnosis not present

## 2019-06-16 LAB — NOVEL CORONAVIRUS, NAA: SARS-CoV-2, NAA: NOT DETECTED

## 2019-06-17 ENCOUNTER — Encounter (HOSPITAL_COMMUNITY): Payer: Medicare Other

## 2019-06-18 ENCOUNTER — Ambulatory Visit (HOSPITAL_COMMUNITY)
Admission: RE | Admit: 2019-06-18 | Discharge: 2019-06-18 | Disposition: A | Discharge status: Home / Self Care | Payer: Medicare Other | Source: Ambulatory Visit | Attending: Internal Medicine | Admitting: Internal Medicine

## 2019-06-18 ENCOUNTER — Other Ambulatory Visit: Payer: Self-pay

## 2019-06-18 DIAGNOSIS — R0600 Dyspnea, unspecified: Secondary | ICD-10-CM | POA: Insufficient documentation

## 2019-06-18 DIAGNOSIS — R0609 Other forms of dyspnea: Secondary | ICD-10-CM | POA: Diagnosis present

## 2019-06-18 LAB — PULMONARY FUNCTION TEST
DL/VA: 2.92 ml/min/mmHg/L
DLCO unc: 15.73 ml/min/mmHg
FEF 25-75 Post: 1.21 L/sec
FEF 25-75 Pre: 1.4 L/sec
FEF2575-%Change-Post: -13 %
FEF2575-%Pred-Post: 68 %
FEF2575-%Pred-Pre: 79 %
FEV1-%Change-Post: -2 %
FEV1-%Pred-Post: 78 %
FEV1-%Pred-Pre: 80 %
FEV1-Post: 2.26 L
FEV1-Pre: 2.32 L
FEV1FVC-%Change-Post: -1 %
FEV1FVC-%Pred-Pre: 101 %
FEV6-%Change-Post: 0 %
FEV6-%Pred-Post: 82 %
FEV6-%Pred-Pre: 82 %
FEV6-Post: 3.18 L
FEV6-Pre: 3.2 L
FEV6FVC-%Change-Post: 0 %
FEV6FVC-%Pred-Post: 104 %
FEV6FVC-%Pred-Pre: 104 %
FVC-%Change-Post: -1 %
FVC-%Pred-Post: 78 %
FVC-%Pred-Pre: 79 %
FVC-Post: 3.27 L
FVC-Pre: 3.31 L
Post FEV1/FVC ratio: 69 %
Post FEV6/FVC ratio: 97 %
Pre FEV1/FVC ratio: 70 %
Pre FEV6/FVC Ratio: 97 %

## 2019-06-18 MED ORDER — ALBUTEROL SULFATE (2.5 MG/3ML) 0.083% IN NEBU
2.5000 mg | INHALATION_SOLUTION | Freq: Once | RESPIRATORY_TRACT | Status: AC
  Administered 2019-06-18: 13:00:00 2.5 mg via RESPIRATORY_TRACT

## 2019-06-30 ENCOUNTER — Telehealth: Payer: Self-pay | Admitting: Physical Therapy

## 2019-06-30 NOTE — Telephone Encounter (Signed)
Spoke with patients wife. The patient has been informed of current processes in place at [facility]patient [facility] to protect patients from Covid-19 exposure including social distancing, schedule modifications, and new cleaning procedures. After discussing their particular risk with a therapist based on the patient's personal risk factors, the patient has decided to proceed with telehealth.  Chantil Bari PT, DPT, LAT, ATC  06/30/19  5:13 PM

## 2019-07-13 DIAGNOSIS — E1129 Type 2 diabetes mellitus with other diabetic kidney complication: Secondary | ICD-10-CM | POA: Diagnosis not present

## 2019-07-13 DIAGNOSIS — Z125 Encounter for screening for malignant neoplasm of prostate: Secondary | ICD-10-CM | POA: Diagnosis not present

## 2019-07-13 DIAGNOSIS — E7800 Pure hypercholesterolemia, unspecified: Secondary | ICD-10-CM | POA: Diagnosis not present

## 2019-07-14 ENCOUNTER — Ambulatory Visit: Payer: Medicare Other | Attending: Internal Medicine | Admitting: Physical Therapy

## 2019-07-14 ENCOUNTER — Encounter: Payer: Self-pay | Admitting: Physical Therapy

## 2019-07-14 DIAGNOSIS — R262 Difficulty in walking, not elsewhere classified: Secondary | ICD-10-CM | POA: Insufficient documentation

## 2019-07-14 DIAGNOSIS — M25551 Pain in right hip: Secondary | ICD-10-CM | POA: Diagnosis not present

## 2019-07-14 NOTE — Therapy (Signed)
[place], [place], [address] Phone: [phone]   Fax:  [phone]  Physical Therapy Evaluation  Patient Details  Name: Victor Wallace MRN: [id number] Date of Birth: August 26, 1924 Referring Provider (PT): Victor Wallace   Encounter Date: 07/14/2019  PT End of Session - 07/14/19 2019    Visit Number  1    Authorization Type  M[employer]    PT Start Time  4128    PT Stop Time  1615    PT Time Calculation (min)  30 min    Behavior During Therapy  [facility] for tasks assessed/performed       Past Medical History:  Diagnosis Date  . CAD (coronary artery disease), native coronary artery    Catheterization 2008 showing moderate nonobstructive disease in LAD, circumflex and right coronary artery, no lesions greater than 40%.  EF of 35% at that time.   Marland Kitchen GERD (gastroesophageal reflux disease)   . History of kidney stones   . Hypercholesterolemia   . Hyperlipidemia 12/02/2008  . Hypertension   . Kidney disease, chronic, stage III (GFR 30-59 ml/min) 08/29/2014  . Paroxysmal atrial fibrillation (HCC) 08/29/2014   Chads2VASC score 5   . Transient global amnesia 2008   . Type 2 diabetes mellitus with renal manifestations, controlled [facility])     Past Surgical History:  Procedure Laterality Date  . CYSTOSCOPY W/ URETERAL STENT PLACEMENT Left 03/06/2013   Procedure: CYSTOSCOPY WITH RETROGRADE PYELOGRAM/URETERAL STENT PLACEMENT;  Surgeon: Victor So, MD;  Location: [facility];  Service: Urology;  Laterality: Left;  . JOINT REPLACEMENT    . TONSILLECTOMY    . TOTAL HIP ARTHROPLASTY Right     There were no vitals filed for this visit.   Subjective Assessment - 07/14/19 2015    Subjective  I cannot walk, it takes all of my breath away. I can walk with my walker. Rt hip is bad- I broke it and had the first operation ~1999 and two more in following years. Pain to mid thigh, knees are going bad, Rt is worse. Wife reports he  becomes SOB walking 20 paces to the bathroom. Has not had a f/u for hip in many years.    Currently in Pain?  Yes    Pain Score  --   moderate   Pain Location  Hip    Pain Orientation  Right    Pain Descriptors / Indicators  Sharp;Sore    Pain Radiating Towards  Rt mid-thigh and knee    Aggravating Factors   walking    Pain Relieving Factors  sit         [facility] PT Assessment - 07/14/19 0001      Assessment   Medical Diagnosis  Deconditioning    Referring Provider (PT)  Victor Wallace    Next MD Visit  1/18    Prior Therapy  no      Precautions   Precautions  Fall      Restrictions   Weight Bearing Restrictions  No      Balance Screen   Has the patient fallen in the past 6 months  No      [place] residence    Living Arrangements  Spouse/significant other      Prior Function   Level of Independence  Needs assistance with gait;Needs assistance with homemaking;Requires assistive device for independence    Vocation  Retired      [employer]  Overall Cognitive Status  Within Functional Limits for tasks assessed                Objective measurements completed on examination: See above findings.              PT Education - 07/14/19 2018    Education Details  see plan    Person(s) Educated  Patient;Spouse    Methods  Explanation    Comprehension  Verbalized understanding;Need further instruction                  Plan - 07/14/19 2019    Clinical Impression Statement  Connected with Victor Wallace and his wife via webex today for PT evaluation. Pt and wife do not feel it is safe for him to come to OP rehab due to COVID-19. Wife provided a majority of history due to cognitive limitations. They did purchase a pulse ox and I asked that he wear it regularly to determine patterns of O2 saturation. Pt declines f/u with cardiologist after heart attack. Due to medical history and recent decline in endurance, I  suggested they obtain a referral to a cardiologist. Pt reports severe pain beginning at his hip and extends into Rt thigh with weight bearing. Hisory of 3 hip surgeries and loosened harware but no recent imaging for f/u. Since I am unable to perform hands on testing, I requested they obtain an xray to ensure there is not a disruption. I do not feel it is safe to prescribe exercises with these questions standing. They are returning to PCP for f/u on Monday.    Personal Factors and Comorbidities  Comorbidity 1    Comorbidities  hip surgeryx3, a-fib, CAD    Examination-Activity Limitations  Bathing;Bed Mobility;Sit;Bend;Squat;Stairs;Stand;Lift;Transfers;Toileting    Examination-Participation Restrictions  Other    Stability/Clinical Decision Making  Unstable/Unpredictable    Clinical Decision Making  High    PT Frequency  One time visit    PT Treatment/Interventions  ADLs/Self Care Home Management;Patient/family education    PT Next Visit Plan  will perform re-evaluation as appropriate following further testing    PT Home Exercise Plan  keep mobile, UE exercises    Recommended Other Services  cardiology, x-ray    Consulted and Agree with Plan of Care  Patient;Family member/caregiver    Family Member Consulted  Wife       Patient will benefit from skilled therapeutic intervention in order to improve the following deficits and impairments:  Abnormal gait, Pain, Cardiopulmonary status limiting activity, Decreased activity tolerance  Visit Diagnosis: Difficulty in walking, not elsewhere classified - Plan: PT plan of care cert/re-cert  Pain in right hip - Plan: PT plan of care cert/re-cert     Problem List Patient Active Problem List   Diagnosis Date Noted  . UTI (urinary tract infection) 04/29/2015  . SIRS (systemic inflammatory response syndrome) (HCC) 04/29/2015  . Bronchitis, chronic obstructive w acute bronchitis (HCC) 04/29/2015  . Arterial hypotension   . Hypotension 04/28/2015  .  Chronic anticoagulation 09/21/2014  . Kidney disease, chronic, stage III (GFR 30-59 ml/min) 08/29/2014  . Paroxysmal atrial fibrillation (HCC) 08/29/2014  . Hypertension 08/29/2014  . Unstable angina pectoris (HCC) 08/29/2014  . History of kidney stones   . GERD (gastroesophageal reflux disease) 01/31/2012  . Type 2 diabetes mellitus with renal manifestations, controlled (HCC)   . Hyperlipidemia 12/02/2008  . CAD (coronary artery disease), native coronary artery     Blake Vetrano C. Aristide Waggle PT, DPT 07/14/19 8:38 PM  [place], [place], [address] Phone: [phone]   Fax:  [phone]  Name: Victor Wallace MRN: [id number] Date of Birth: Nov 21, 1924

## 2019-07-15 DIAGNOSIS — R82998 Other abnormal findings in urine: Secondary | ICD-10-CM | POA: Diagnosis not present

## 2019-07-20 DIAGNOSIS — R809 Proteinuria, unspecified: Secondary | ICD-10-CM | POA: Diagnosis not present

## 2019-07-20 DIAGNOSIS — R413 Other amnesia: Secondary | ICD-10-CM | POA: Diagnosis not present

## 2019-07-20 DIAGNOSIS — N1832 Chronic kidney disease, stage 3b: Secondary | ICD-10-CM | POA: Diagnosis not present

## 2019-07-20 DIAGNOSIS — Z7901 Long term (current) use of anticoagulants: Secondary | ICD-10-CM | POA: Diagnosis not present

## 2019-07-20 DIAGNOSIS — M25551 Pain in right hip: Secondary | ICD-10-CM | POA: Diagnosis not present

## 2019-07-20 DIAGNOSIS — Z794 Long term (current) use of insulin: Secondary | ICD-10-CM | POA: Diagnosis not present

## 2019-07-20 DIAGNOSIS — Z1331 Encounter for screening for depression: Secondary | ICD-10-CM | POA: Diagnosis not present

## 2019-07-20 DIAGNOSIS — I2510 Atherosclerotic heart disease of native coronary artery without angina pectoris: Secondary | ICD-10-CM | POA: Diagnosis not present

## 2019-07-20 DIAGNOSIS — E1151 Type 2 diabetes mellitus with diabetic peripheral angiopathy without gangrene: Secondary | ICD-10-CM | POA: Diagnosis not present

## 2019-07-20 DIAGNOSIS — Z0000 Encounter for general adult medical examination without abnormal findings: Secondary | ICD-10-CM | POA: Diagnosis not present

## 2019-07-20 DIAGNOSIS — I480 Paroxysmal atrial fibrillation: Secondary | ICD-10-CM | POA: Diagnosis not present

## 2019-07-20 DIAGNOSIS — E1129 Type 2 diabetes mellitus with other diabetic kidney complication: Secondary | ICD-10-CM | POA: Diagnosis not present

## 2019-07-20 DIAGNOSIS — N2581 Secondary hyperparathyroidism of renal origin: Secondary | ICD-10-CM | POA: Diagnosis not present

## 2019-07-20 DIAGNOSIS — Z1339 Encounter for screening examination for other mental health and behavioral disorders: Secondary | ICD-10-CM | POA: Diagnosis not present

## 2019-07-23 ENCOUNTER — Ambulatory Visit: Payer: Medicare Other | Admitting: Cardiology

## 2019-08-10 ENCOUNTER — Emergency Department (HOSPITAL_COMMUNITY): Payer: Medicare Other

## 2019-08-10 ENCOUNTER — Other Ambulatory Visit: Payer: Self-pay

## 2019-08-10 ENCOUNTER — Encounter: Payer: Self-pay | Admitting: Cardiology

## 2019-08-10 ENCOUNTER — Emergency Department (HOSPITAL_COMMUNITY)
Admission: EM | Admit: 2019-08-10 | Discharge: 2019-08-11 | Disposition: A | Discharge status: Home / Self Care | Payer: Medicare Other | Attending: Emergency Medicine | Admitting: Emergency Medicine

## 2019-08-10 ENCOUNTER — Ambulatory Visit (INDEPENDENT_AMBULATORY_CARE_PROVIDER_SITE_OTHER): Payer: Medicare Other | Admitting: Cardiology

## 2019-08-10 VITALS — BP 73/46 | HR 87 | Ht 74.0 in | Wt 200.0 lb

## 2019-08-10 DIAGNOSIS — N183 Chronic kidney disease, stage 3 unspecified: Secondary | ICD-10-CM | POA: Diagnosis not present

## 2019-08-10 DIAGNOSIS — Z79899 Other long term (current) drug therapy: Secondary | ICD-10-CM | POA: Insufficient documentation

## 2019-08-10 DIAGNOSIS — E1122 Type 2 diabetes mellitus with diabetic chronic kidney disease: Secondary | ICD-10-CM | POA: Insufficient documentation

## 2019-08-10 DIAGNOSIS — I2510 Atherosclerotic heart disease of native coronary artery without angina pectoris: Secondary | ICD-10-CM | POA: Diagnosis not present

## 2019-08-10 DIAGNOSIS — R0602 Shortness of breath: Secondary | ICD-10-CM | POA: Insufficient documentation

## 2019-08-10 DIAGNOSIS — I129 Hypertensive chronic kidney disease with stage 1 through stage 4 chronic kidney disease, or unspecified chronic kidney disease: Secondary | ICD-10-CM | POA: Insufficient documentation

## 2019-08-10 DIAGNOSIS — I480 Paroxysmal atrial fibrillation: Secondary | ICD-10-CM

## 2019-08-10 DIAGNOSIS — N3090 Cystitis, unspecified without hematuria: Secondary | ICD-10-CM | POA: Insufficient documentation

## 2019-08-10 DIAGNOSIS — Z7901 Long term (current) use of anticoagulants: Secondary | ICD-10-CM | POA: Insufficient documentation

## 2019-08-10 DIAGNOSIS — I959 Hypotension, unspecified: Secondary | ICD-10-CM

## 2019-08-10 DIAGNOSIS — R531 Weakness: Secondary | ICD-10-CM | POA: Diagnosis not present

## 2019-08-10 DIAGNOSIS — R5383 Other fatigue: Secondary | ICD-10-CM | POA: Diagnosis not present

## 2019-08-10 DIAGNOSIS — I4891 Unspecified atrial fibrillation: Secondary | ICD-10-CM | POA: Diagnosis not present

## 2019-08-10 DIAGNOSIS — Z794 Long term (current) use of insulin: Secondary | ICD-10-CM | POA: Insufficient documentation

## 2019-08-10 DIAGNOSIS — Z7982 Long term (current) use of aspirin: Secondary | ICD-10-CM | POA: Diagnosis not present

## 2019-08-10 LAB — TROPONIN I (HIGH SENSITIVITY): Troponin I (High Sensitivity): 10 ng/L (ref <18)

## 2019-08-10 LAB — URINALYSIS, ROUTINE W REFLEX MICROSCOPIC
Bilirubin Urine: NEGATIVE
Glucose, UA: >=500 mg/dL — AB
Ketones, ur: 5 mg/dL — AB
Nitrite: NEGATIVE
Protein, ur: 30 mg/dL — AB
RBC / HPF: >50 RBC/hpf — ABNORMAL HIGH (ref 0–5)
Specific Gravity, Urine: 1.015 (ref 1.005–1.030)
WBC, UA: >50 WBC/hpf — ABNORMAL HIGH (ref 0–5)
pH: 5 (ref 5.0–8.0)

## 2019-08-10 LAB — BASIC METABOLIC PANEL
Anion gap: 10 (ref 5–15)
BUN: 34 mg/dL — ABNORMAL HIGH (ref 8–23)
CO2: 25 mmol/L (ref 22–32)
Calcium: 9.2 mg/dL (ref 8.9–10.3)
Chloride: 108 mmol/L (ref 98–111)
Creatinine, Ser: 1.84 mg/dL — ABNORMAL HIGH (ref 0.61–1.24)
GFR calc Af Amer: 36 mL/min — ABNORMAL LOW (ref >60)
GFR calc non Af Amer: 31 mL/min — ABNORMAL LOW (ref >60)
Glucose, Bld: 217 mg/dL — ABNORMAL HIGH (ref 70–99)
Potassium: 4.5 mmol/L (ref 3.5–5.1)
Sodium: 143 mmol/L (ref 135–145)

## 2019-08-10 LAB — CBC
HCT: 46.3 % (ref 39.0–52.0)
Hemoglobin: 14.3 g/dL (ref 13.0–17.0)
MCH: 30.3 pg (ref 26.0–34.0)
MCHC: 30.9 g/dL (ref 30.0–36.0)
MCV: 98.1 fL (ref 80.0–100.0)
Platelets: 190 10*3/uL (ref 150–400)
RBC: 4.72 MIL/uL (ref 4.22–5.81)
RDW: 13.7 % (ref 11.5–15.5)
WBC: 10.3 10*3/uL (ref 4.0–10.5)
nRBC: 0 % (ref 0.0–0.2)

## 2019-08-10 LAB — D-DIMER, QUANTITATIVE: D-Dimer, Quant: 0.8 ug/mL-FEU — ABNORMAL HIGH (ref 0.00–0.50)

## 2019-08-10 LAB — CBG MONITORING, ED: Glucose-Capillary: 208 mg/dL — ABNORMAL HIGH (ref 70–99)

## 2019-08-10 LAB — TSH: TSH: 4.318 u[IU]/mL (ref 0.350–4.500)

## 2019-08-10 LAB — BRAIN NATRIURETIC PEPTIDE: B Natriuretic Peptide: 58.2 pg/mL (ref 0.0–100.0)

## 2019-08-10 MED ORDER — SODIUM CHLORIDE 0.9% FLUSH
3.0000 mL | Freq: Once | INTRAVENOUS | Status: AC
  Administered 2019-08-10: 3 mL via INTRAVENOUS

## 2019-08-10 MED ORDER — SODIUM CHLORIDE 0.9 % IV BOLUS (SEPSIS)
1000.0000 mL | Freq: Once | INTRAVENOUS | Status: AC
  Administered 2019-08-10: 1000 mL via INTRAVENOUS

## 2019-08-10 MED ORDER — NITROFURANTOIN MONOHYD MACRO 100 MG PO CAPS
100.0000 mg | ORAL_CAPSULE | Freq: Once | ORAL | Status: AC
  Administered 2019-08-11: 100 mg via ORAL
  Filled 2019-08-10: qty 1

## 2019-08-10 MED ORDER — NITROFURANTOIN MONOHYD MACRO 100 MG PO CAPS: 100.0000 mg | ORAL_CAPSULE | Freq: Two times a day (BID) | ORAL | 0 refills | Status: AC

## 2019-08-10 MED ORDER — SODIUM CHLORIDE 0.9 % IV BOLUS
1000.0000 mL | Freq: Once | INTRAVENOUS | Status: AC
  Administered 2019-08-10: 1000 mL via INTRAVENOUS

## 2019-08-10 NOTE — ED Notes (Signed)
Patient voided with urinal - stand by assist

## 2019-08-10 NOTE — Patient Instructions (Signed)
Medication Instructions:  Hold Imdur, Eliquis, and Metoprolol   *If you need a refill on your cardiac medications before your next appointment, please call your pharmacy*  Lab Work: None  Testing/Procedures: None  Follow-Up: At [facility], you and your health needs are our priority.  As part of our continuing mission to provide you with exceptional heart care, we have created designated Provider Care Teams.  These Care Teams include your primary Cardiologist (physician) and Advanced Practice Providers (APPs -  Physician Assistants and Nurse Practitioners) who all work together to provide you with the care you need, when you need it.  Your next appointment:   1-2 week(s)  The format for your next appointment:   In Person  Provider:   Buford Dresser, MD

## 2019-08-10 NOTE — Discharge Instructions (Addendum)
Discharge Summary and Instructions  Victor Wallace was diagnosed with a urinary tract infection today.  I started Victor Wallace on Macrobid, an antibiotic, which Victor Wallace should take for 7 days.  His primary care doctor can follow-up on his urine culture.  Victor Wallace came in with low blood pressure initially.  This responded very well to some IV fluids.  Please ensure that Victor Wallace is drinking plenty of water at home.  There may be some component of dehydration to his blood pressure being low.  We also talked about his Ddimer test, which is a test for blood clotting.  This test was abnormally high.  This MAY indicate a blood clot, but is not specific for this.  We decided not to pursue a CT scan of his lungs, because Victor Wallace had poor kidney function, and may not tolerate the IV contrast.  I recommended that you talk to his primary care doctor about getting a "V/Q Study" as an outpatient. I think Victor Wallace only needs this if Victor Wallace continues to have severe fatigue  after his urine infection has been treated.   In the meantime, Victor Wallace should continue taking Victor Wallace, his blood thinner.  Finally we discussed the plan for managing his blood pressure at home.  His goal systolic pressure (top number) should be between 100-160 mm hg.  You should check his pressure twice a day, in the morning and at night, and keep a diary of his numbers.  Starting tomorrow, stop giving Victor Wallace Imdur (isosorbide).  You should continue metoprolol twice daily as usual.  If his blood pressure drops below 100 mm hg, reduce his metoprolol to once daily (in the morning).  If his pressure is still low 24 hours after this, stop the metoprolol.  If his blood pressure ever drops below 70 mm hg systolic (the top number), have Victor Wallace lie flat, give Victor Wallace water to drink, recheck his blood pressure after 10 minutes, and bring Victor Wallace back to the ER if it is still below 70 mm hg.

## 2019-08-10 NOTE — ED Triage Notes (Signed)
Pt in POV, sent by Cardiology for hypotension. Per family pt has had inc weakness, fatigue, SOB on exertion, dec appetite, weight loss X few weeks. Hx dementia, meets ALONE criteria.

## 2019-08-10 NOTE — ED Provider Notes (Signed)
[place] EMERGENCY DEPARTMENT Provider Note    CSN: [id number] Arrival date & time: 08/10/19  1710     History Chief Complaint  Patient presents with  . Hypotension    Victor Wallace is a 86 y.o. male history of coronary artery disease, paroxysmal A. fib on Eliquis, high cholesterol, labile blood pressures, on metoprolol twice daily, presenting to emergency department hypotension.  Patient was seen for first visit in the office by cardiologist today.  He was noted to have a very low blood pressure in the office, per Dr Judeth Cornfield note, 67/43 right arm and 73/46 left arm.  He was advised to come to the emergency department because of his low blood pressure.  His wife at the bedside provides additional history as the patient is somewhat forgetful.  She reports that he has been taking his metoprolol faithfully twice a day.  His cardiologist today had recommended that he stop taking his metoprolol as well as his Eliquis (for parox A Fib) due to his being a fall risk.  His wife tells me the patient has had extreme fatigue for the past several months, worsening in the past 3 months.  The patient is tired all the time.  He has very low energy.  He gets "winded" even walking across the room.  He typically needs to be pushed around in a wheelchair.  He wants to sleep all the time.  Patient himself tells me has no complaints other than feeling very tired.  He denies any headache or lightheadedness.  He denies any black or tarry stools.  His hemoglobin today was within normal limits    HPI     Past Medical History:  Diagnosis Date  . CAD (coronary artery disease), native coronary artery    Catheterization 2008 showing moderate nonobstructive disease in LAD, circumflex and right coronary artery, no lesions greater than 40%.  EF of 35% at that time.   Marland Kitchen GERD (gastroesophageal reflux disease)   . History of kidney stones   . Hypercholesterolemia   . Hyperlipidemia  12/02/2008  . Hypertension   . Kidney disease, chronic, stage III (GFR 30-59 ml/min) 08/29/2014  . Paroxysmal atrial fibrillation (HCC) 08/29/2014   Chads2VASC score 5   . Transient global amnesia 2008   . Type 2 diabetes mellitus with renal manifestations, controlled [facility])     Patient Active Problem List   Diagnosis Date Noted  . UTI (urinary tract infection) 04/29/2015  . SIRS (systemic inflammatory response syndrome) ([place]) 04/29/2015  . Bronchitis, chronic obstructive w acute bronchitis ([place]) 04/29/2015  . Arterial hypotension   . Hypotension 04/28/2015  . Chronic anticoagulation 09/21/2014  . Kidney disease, chronic, stage III (GFR 30-59 ml/min) 08/29/2014  . Paroxysmal atrial fibrillation ([place]) 08/29/2014  . Hypertension 08/29/2014  . Unstable angina pectoris ([place]) 08/29/2014  . History of kidney stones   . GERD (gastroesophageal reflux disease) 01/31/2012  . Type 2 diabetes mellitus with renal manifestations, controlled ([place])   . Hyperlipidemia 12/02/2008  . CAD (coronary artery disease), native coronary artery     Past Surgical History:  Procedure Laterality Date  . CYSTOSCOPY W/ URETERAL STENT PLACEMENT Left 03/06/2013   Procedure: CYSTOSCOPY WITH RETROGRADE PYELOGRAM/URETERAL STENT PLACEMENT;  Surgeon: Malka So, MD;  Location: [facility] ORS;  Service: Urology;  Laterality: Left;  . JOINT REPLACEMENT    . TONSILLECTOMY    . TOTAL HIP ARTHROPLASTY Right        Family History  Problem Relation Age of Onset  .  Heart attack Father   . Emphysema Brother     Social History   Tobacco Use  . Smoking status: Never Smoker  . Smokeless tobacco: Never Used  Substance Use Topics  . Alcohol use: Yes    Alcohol/week: 1.0 standard drinks    Types: 1 Cans of beer per week    Comment: occasionally  . Drug use: No    Home Medications Prior to Admission medications   Medication Sig Start Date End Date Taking? Authorizing Provider  aspirin EC 81 MG tablet Take 1 tablet (81 mg  total) by mouth daily. Patient taking differently: Take 162 mg by mouth at bedtime.  08/31/14  Yes Lyda Jester M, PA-C  atorvastatin (LIPITOR) 40 MG tablet Take 40 mg by mouth daily.   Yes [provider]  Calcium Carb-Cholecalciferol (CALCIUM 600+D3 PO) Take 1 tablet by mouth at bedtime.   Yes [provider]  cycloSPORINE (RESTASIS) 0.05 % ophthalmic emulsion Place 2 drops 2 (two) times daily into both eyes.    Yes [provider]  docusate sodium (COLACE) 100 MG capsule Take 100 mg by mouth at bedtime.   Yes [provider]  ELIQUIS 2.5 MG TABS tablet TAKE 1 TABLET TWICE A DAY (NEED TO SCHEDULE MD VISIT FOR FURTHER REFILLS) Patient taking differently: Take 2.5 mg by mouth 2 (two) times daily.  05/03/16  Yes Bensimhon, Shaune Pascal, MD  fish oil-omega-3 fatty acids 1000 MG capsule Take 1 capsule (1 g total) by mouth daily. Stop taking if having diarrhea Patient taking differently: Take 1 g by mouth at bedtime.  02/05/12  Yes Rai, Ripudeep K, MD  hypromellose (GENTEAL SEVERE) 0.3 % GEL ophthalmic ointment Place 1 application into both eyes as needed for dry eyes.   Yes [provider]  insulin aspart (NOVOLOG) 100 UNIT/ML injection Inject 0-10 Units into the skin See admin instructions. Inject 0-10 units into the skin 3 times a day with meals and at bedtime per sliding scale, depending on carb intake: BGL <120 = give nothing; 121-150 = 1 unit; 151-200 = 2 units; 201-250 = 3 units; 251-300 = 4 units; 301-350 = 5 units; 351-400 = 6 units; >400 = 10 units   Yes [provider]  insulin detemir (LEVEMIR) 100 UNIT/ML injection Inject 24 Units at bedtime into the skin.    Yes [provider]  linagliptin (TRADJENTA) 5 MG TABS tablet Take 1 tablet (5 mg total) by mouth daily. 02/05/12  Yes Rai, Ripudeep K, MD  metoprolol tartrate (LOPRESSOR) 25 MG tablet Take 1 tablet (25 mg total) by mouth 2 (two) times daily. 07/15/15  Yes Lyda Jester M, PA-C   Multiple Vitamins-Minerals (MULTIVITAMIN WITH MINERALS) tablet Take 1 tablet by mouth daily.    Yes [provider]  Multiple Vitamins-Minerals (PRESERVISION/LUTEIN) CAPS Take 1 capsule by mouth 2 (two) times daily.   Yes [provider]  omeprazole (PRILOSEC) 20 MG capsule Take 20 mg every evening by mouth.    Yes [provider]  pyridOXINE (VITAMIN B-6) 100 MG tablet Take 100 mg by mouth 2 (two) times daily.    Yes [provider]  timolol (BETIMOL) 0.5 % ophthalmic solution Place 1 drop into the left eye 2 (two) times daily.    Yes [provider]  isosorbide mononitrate (IMDUR) 30 MG 24 hr tablet Take 30 mg by mouth daily.  08/10/19 Yes [provider]  albuterol (PROVENTIL HFA;VENTOLIN HFA) 108 (90 BASE) MCG/ACT inhaler Inhale 2 puffs into the  lungs every 6 (six) hours as needed for wheezing or shortness of breath. Patient not taking: Reported on 08/10/2019 04/30/15   Reyne Dumas, MD  cephALEXin (KEFLEX) 500 MG capsule Take 1 capsule (500 mg total) 2 (two) times daily by mouth. Patient not taking: Reported on 08/10/2019 05/17/17   Larene Pickett, PA-C  fluticasone [facility]) 50 MCG/ACT nasal spray Place 2 sprays into both nostrils daily. Patient not taking: Reported on 08/10/2019 04/30/15   Reyne Dumas, MD  guaiFENesin-codeine 100-10 MG/5ML syrup Take 5 mLs by mouth every 4 (four) hours as needed for cough. Patient not taking: Reported on 08/10/2019 04/30/15   Reyne Dumas, MD  nitrofurantoin, macrocrystal-monohydrate, (MACROBID) 100 MG capsule Take 1 capsule (100 mg total) by mouth 2 (two) times daily for 7 days. 08/10/19 08/17/19  Wyvonnia Dusky, MD  nitroGLYCERIN (NITROSTAT) 0.4 MG SL tablet Place 1 tablet (0.4 mg total) under the tongue every 5 (five) minutes x 3 doses as needed for chest pain. Patient not taking: Reported on 08/10/2019 08/31/14   Consuelo Pandy, PA-C    Allergies    Shirley Friar [lifitegrast]  Review of Systems   Review  of Systems  Constitutional: Positive for fatigue. Negative for chills and fever.  Respiratory: Negative for cough and shortness of breath.   Cardiovascular: Negative for chest pain and palpitations.  Gastrointestinal: Negative for abdominal pain, blood in stool, diarrhea, nausea and vomiting.  Genitourinary: Negative for dysuria and hematuria.  Skin: Negative for color change and rash.  Neurological: Positive for light-headedness. Negative for syncope, numbness and headaches.  All other systems reviewed and are negative.   Physical Exam Updated Vital Signs BP (!) 153/80   Pulse 76   Temp (!) 97 F (36.1 C) (Oral)   Resp 20   SpO2 97%   Physical Exam Vitals and nursing note reviewed.  Constitutional:      General: He is not in acute distress.    Appearance: He is well-developed.     Comments: Appears tired  HENT:     Head: Normocephalic and atraumatic.  Eyes:     Conjunctiva/sclera: Conjunctivae normal.  Cardiovascular:     Rate and Rhythm: Normal rate and regular rhythm.     Pulses: Normal pulses.  Pulmonary:     Effort: Pulmonary effort is normal. No respiratory distress.     Breath sounds: Normal breath sounds.  Abdominal:     General: There is no distension or abdominal bruit.     Palpations: Abdomen is soft.     Tenderness: There is no abdominal tenderness. There is no guarding.  Musculoskeletal:     Cervical back: Neck supple.  Skin:    General: Skin is warm and dry.  Neurological:     General: No focal deficit present.     Mental Status: He is alert and oriented to person, place, and time.     ED Results / Procedures / Treatments   Labs (all labs ordered are listed, but only abnormal results are displayed) Labs Reviewed  BASIC METABOLIC PANEL - Abnormal; Notable for the following components:      Result Value   Glucose, Bld 217 (*)    BUN 34 (*)    Creatinine, Ser 1.84 (*)    GFR calc non Af Amer 31 (*)    GFR calc Af Amer 36 (*)    All other  components within normal limits  URINALYSIS, ROUTINE W REFLEX MICROSCOPIC - Abnormal; Notable for the following components:   APPearance CLOUDY (*)  Glucose, UA >=500 (*)    Hgb urine dipstick MODERATE (*)    Ketones, ur 5 (*)    Protein, ur 30 (*)    Leukocytes,Ua LARGE (*)    RBC / HPF >50 (*)    WBC, UA >50 (*)    Bacteria, UA MANY (*)    All other components within normal limits  D-DIMER, QUANTITATIVE (NOT AT [employer] - Abnormal; Notable for the following components:   D-Dimer, Quant 0.80 (*)    All other components within normal limits  CBG MONITORING, ED - Abnormal; Notable for the following components:   Glucose-Capillary 208 (*)    All other components within normal limits  URINE CULTURE  CBC  BRAIN NATRIURETIC PEPTIDE  TSH  TROPONIN I (HIGH SENSITIVITY)    EKG EKG Interpretation  Date/Time:  Monday August 10 2019 18:31:52 EST Ventricular Rate:  81 PR Interval:  188 QRS Duration: 146 QT Interval:  413 QTC Calculation: 480 R Axis:   -63 Text Interpretation: Sinus rhythm RBBB and LAFB Left ventricular hypertrophy No STEMI Confirmed by Octaviano Glow [phone]) on 08/10/2019 6:50:58 PM   Radiology DG Chest Portable 1 View  Result Date: 08/10/2019 CLINICAL DATA:  Hypotension, weakness, fatigue EXAM: PORTABLE CHEST 1 VIEW COMPARISON:  05/16/2017 FINDINGS: The heart size and mediastinal contours are within normal limits. Both lungs are clear. The visualized skeletal structures are unremarkable. IMPRESSION: No active disease. Electronically Signed   By: Randa Ngo M.D.   On: 08/10/2019 18:43    Procedures Procedures (including critical care time)  Medications Ordered in ED Medications  sodium chloride flush (NS) 0.9 % injection 3 mL (3 mLs Intravenous Given 08/10/19 1922)  sodium chloride 0.9 % bolus 1,000 mL (0 mLs Intravenous Stopped 08/10/19 2036)  sodium chloride 0.9 % bolus 1,000 mL (0 mLs Intravenous Stopped 08/10/19 2146)  nitrofurantoin  (macrocrystal-monohydrate) (MACROBID) capsule 100 mg (100 mg Oral Given 08/11/19 0003)    ED Course  I have reviewed the triage vital signs and the nursing notes.  Pertinent labs & imaging results that were available during my care of the patient were reviewed by me and considered in my medical decision making (see chart for details).  86 yo male on eliquis, metoprolol, imdur presenting to the ED with fatigue and hypotension.  Patient referred to ED from cardiologist office with concern for low blood pressure in range of 59'F systolic in the office.  His wife at bedside (who does an excellent job keeping up with his medical regime) tells me they haven't been consistently checking his blood pressures at home, because they did not think this was an issue for him.  They do have a BP cuff however and know how to use it.  The patient arrived with BP 88/59.  He was given some IV fluids with marked improvement in his blood pressure and his energy level.  I suspect there may largely be an iatrogenic component to his weakness 2/2 his medications, and that he does not need to be on both metoprolol and imdur.  We discussed a titration regimen and I detailed this in their discharge instructions.  We'll stop imdur first, and continue metoprolol BID.  If his pressures remain low at home (SBP < 100 mhg), his wife will scale back the metoprolol as well.  He also was diagnosed with a likely UTI, which he has had in the past.  This may be contributing to some of his fatigue and weakness.  We'll start tx with macrobid based  on his last urine culture.  His hgb and TSH are unremarkable here, I do not suspect thyroid dysfunction or anemia are a cause of his fatigue.  Initially I ordered a DDimer given his reported dyspnea on exertion.  However with improvement of his BP and energy with fluids, my suspicion for PE dramatically reduced.  He's not tachypneic or hypoxia.  We discussed the pros and cons of eliquis, and I think  it's reasonable for him to remain on eliquis for now.  He's been managing his weakness carefully for many months, and hasn't had any falls at home.  His doctor can discontinue this in the future if they have further concerns.  I was able to answer his questions and his wife's questions to the best of my ability.  Clinical Course as of Aug 10 1150  Mon Aug 10, 2019  2310 Leukocytes,Ua(!): LARGE [MT]  2310 Bacteria, UA(!): MANY [MT]  2310 WBC, UA(!): >50 [MT]  2341 Hypotension resolved with IVF, patient looking and feeling better.  There may have been a dehydration component behind all this.  Will also treat for UTI (he has hx of these) with macrobid based on his last urine cultures from 2018.  I spoke to his wife about a BP regimen to titrate down and maintain his systolic pressure between 100-160 mm hg.  We also discussed his DDImer result.  Although elevated, I do not suspect massive PE at this time.  I advised he continue his eliquis and talk to his doctors about setting up a V/Q scan as an outpatient.   [MT]    Clinical Course User Index [MT] Jerrold Haskell, Carola Rhine, MD    Final Clinical Impression(s) / ED Diagnoses Final diagnoses:  Cystitis  Hypotension, unspecified hypotension type    Rx / DC Orders ED Discharge Orders         Ordered    nitrofurantoin, macrocrystal-monohydrate, (MACROBID) 100 MG capsule  2 times daily     08/10/19 2350           Wyvonnia Dusky, MD 08/11/19 1153

## 2019-08-10 NOTE — Progress Notes (Signed)
Cardiology Office Note:    Date:  08/10/2019   ID:  Victor Wallace, DOB 1925-06-28, MRN [id number]  PCP:  Haywood Pao, MD  Cardiologist:  Buford Dresser, MD  Referring MD: Haywood Pao, MD   CC: new patient evaluation for fatigue and dyspnea on exertion  History of Present Illness:    Victor Wallace is a 86 y.o. male with a hx of prior CAD, type II diabetes, paroxysmal atrial fibrillation, chrinic kidney disease who is seen as a new consult at the request of Tisovec, Fransico Him, MD for the evaluation and management of fatigue and dyspnea on exertion.  He has had longstanding issues with both high and low blood pressure. The information I have from Dr. Loren Racer most recent visit was a telehealth visit. I do not have any recent in-office numbers available. Today his initial blood pressure was 67/43 on the right and 73/46 on the left. Recheck on the right 78/52. Has a home cuff but doesn't use routinely. Last BP for telehealth check was 96/53. No falls, no syncope recently.   He thinks he is down about 20 lbs from his peak/normal adult weight. Appetite/food is limited, eats only a few main foods. Drinks 12 oz of coffee and two large containers of water per day.  Limited ambulation, has hip pain with any walking, is in wheelchair today. Has noted shortness of breath intermittently as well, even just with speaking. Doesn't occur constantly. Has occurred on and off for months.   Has frequent UTIs in the past, none recently, denies symptoms including dysuria. No fevers, chills, night sweats.   Given his very low blood pressure, we discussed that he needs emergent evaluation at a higher level of care. We called the [facility] ER charge nurse to let them know. They would prefer to drive themselves vs. EMS. He does have memory issues, wife is primary caregiver and provides the history. He cannot recall things for long.  Denies chest pain, PND, orthopnea, LE edema or unexpected  weight gain. No syncope or palpitations.  Past Medical History:  Diagnosis Date  . CAD (coronary artery disease), native coronary artery    Catheterization 2008 showing moderate nonobstructive disease in LAD, circumflex and right coronary artery, no lesions greater than 40%.  EF of 35% at that time.   Marland Kitchen GERD (gastroesophageal reflux disease)   . History of kidney stones   . Hypercholesterolemia   . Hyperlipidemia 12/02/2008  . Hypertension   . Kidney disease, chronic, stage III (GFR 30-59 ml/min) 08/29/2014  . Paroxysmal atrial fibrillation (HCC) 08/29/2014   Chads2VASC score 5   . Transient global amnesia 2008   . Type 2 diabetes mellitus with renal manifestations, controlled [facility])     Past Surgical History:  Procedure Laterality Date  . CYSTOSCOPY W/ URETERAL STENT PLACEMENT Left 03/06/2013   Procedure: CYSTOSCOPY WITH RETROGRADE PYELOGRAM/URETERAL STENT PLACEMENT;  Surgeon: Malka So, MD;  Location: [facility] ORS;  Service: Urology;  Laterality: Left;  . JOINT REPLACEMENT    . TONSILLECTOMY    . TOTAL HIP ARTHROPLASTY Right     Current Medications: Current Outpatient Medications on File Prior to Visit  Medication Sig  . albuterol (PROVENTIL HFA;VENTOLIN HFA) 108 (90 BASE) MCG/ACT inhaler Inhale 2 puffs into the lungs every 6 (six) hours as needed for wheezing or shortness of breath.  Marland Kitchen aspirin EC 81 MG tablet Take 1 tablet (81 mg total) by mouth daily. (Patient taking differently: Take 162 mg by mouth at bedtime. )  .  atorvastatin (LIPITOR) 40 MG tablet Take 40 mg by mouth daily.  . Calcium Carbonate-Vitamin D (CALCIUM-VITAMIN D) 500-200 MG-UNIT per tablet Take 1 tablet every evening by mouth.   . cephALEXin (KEFLEX) 500 MG capsule Take 1 capsule (500 mg total) 2 (two) times daily by mouth.  . cycloSPORINE (RESTASIS) 0.05 % ophthalmic emulsion Place 2 drops 2 (two) times daily into both eyes.   Marland Kitchen docusate sodium (COLACE) 100 MG capsule Take 100 mg by mouth at bedtime.  Marland Kitchen ELIQUIS 2.5 MG  TABS tablet TAKE 1 TABLET TWICE A DAY (NEED TO SCHEDULE MD VISIT FOR FURTHER REFILLS)  . fish oil-omega-3 fatty acids 1000 MG capsule Take 1 capsule (1 g total) by mouth daily. Stop taking if having diarrhea  . fluticasone (FLONASE) 50 MCG/ACT nasal spray Place 2 sprays into both nostrils daily.  Marland Kitchen guaiFENesin-codeine 100-10 MG/5ML syrup Take 5 mLs by mouth every 4 (four) hours as needed for cough.  . insulin aspart (NOVOLOG) 100 UNIT/ML injection Inject 0-15 Units into the skin 4 (four) times daily -  with meals and at bedtime. Sliding scale, 70-120-0 units, 121-150 2 units, 151-200 3 units, 201-250 3 units 251-300 8 units 301-350 11 units, 351-400 15 units, beyond call dr  . insulin detemir (LEVEMIR) 100 UNIT/ML injection Inject 24 Units at bedtime into the skin.   Marland Kitchen isosorbide mononitrate (IMDUR) 30 MG 24 hr tablet Take 30 mg by mouth daily.  Marland Kitchen Lifitegrast (XIIDRA) 5 % SOLN Place 1 drop 2 (two) times daily into both eyes.   Marland Kitchen linagliptin (TRADJENTA) 5 MG TABS tablet Take 1 tablet (5 mg total) by mouth daily.  . metoprolol tartrate (LOPRESSOR) 25 MG tablet Take 1 tablet (25 mg total) by mouth 2 (two) times daily.  . Multiple Vitamins-Minerals (MULTIVITAMIN WITH MINERALS) tablet Take 1 tablet by mouth daily.   . Multiple Vitamins-Minerals (PRESERVISION AREDS 2) CAPS Take 1 capsule by mouth 2 (two) times daily.   . nitroGLYCERIN (NITROSTAT) 0.4 MG SL tablet Place 1 tablet (0.4 mg total) under the tongue every 5 (five) minutes x 3 doses as needed for chest pain.  Marland Kitchen omeprazole (PRILOSEC) 20 MG capsule Take 20 mg every evening by mouth.   . pyridOXINE (VITAMIN B-6) 100 MG tablet Take 100 mg daily by mouth.  . timolol (BETIMOL) 0.5 % ophthalmic solution Place 1 drop into the left eye daily.   . vitamin B-12 (CYANOCOBALAMIN) 1000 MCG tablet Take 1,000 mcg by mouth daily.   No current facility-administered medications on file prior to visit.     Allergies:   Patient has no known allergies.   Social  History   Tobacco Use  . Smoking status: Never Smoker  . Smokeless tobacco: Never Used  Substance Use Topics  . Alcohol use: Yes    Alcohol/week: 1.0 standard drinks    Types: 1 Cans of beer per week    Comment: occasionally  . Drug use: No    Family History: family history includes Emphysema in his brother; Heart attack in his father.  ROS:   Please see the history of present illness.  Additional pertinent ROS: Constitutional: Negative for chills, fever, night sweats, unintentional weight loss  HENT: Negative for ear pain and hearing loss.   Eyes: Negative for loss of vision and eye pain.  Respiratory: Negative for cough, sputum, wheezing.   Cardiovascular: See HPI. Gastrointestinal: Negative for abdominal pain, melena, and hematochezia.  Genitourinary: Negative for dysuria and hematuria. Does have history of UTIs Musculoskeletal: Negative for falls  and myalgias.  Skin: Negative for itching and rash.  Neurological: Negative for focal weakness, focal sensory changes and loss of consciousness.  Endo/Heme/Allergies: Does not bruise/bleed easily.     EKGs/Labs/Other Studies Reviewed:    The following studies were reviewed today: Echo 2016 reviewed today  EKG:  EKG is personally reviewed.  The ekg ordered today demonstrates NSR, RBBB  Recent Labs: No results found for requested labs within last 8760 hours.  Recent Lipid Panel    Component Value Date/Time   CHOL 105 08/30/2014 0052   TRIG 49 08/30/2014 0052   HDL 39 (L) 08/30/2014 0052   CHOLHDL 2.7 08/30/2014 0052   VLDL 10 08/30/2014 0052   LDLCALC 56 08/30/2014 0052    Physical Exam:    VS:  BP (!) 73/46   Pulse 87   Ht $R'6\' 2"'Et$  (1.88 m)   Wt 200 lb (90.7 kg)   SpO2 97%   BMI 25.68 kg/m     Wt Readings from Last 3 Encounters:  08/10/19 200 lb (90.7 kg)  05/16/17 200 lb (90.7 kg)  04/29/15 208 lb 5.4 oz (94.5 kg)    GEN: Frail appearing, in no acute distress HEENT: Normal NECK: No JVD CARDIAC: regular  rhythm, normal S1 and S2, no rubs or gallops. 2/6 SEM VASCULAR: Radial and DP pulses 2+ bilaterally. No carotid bruits RESPIRATORY:  Clear to auscultation without rales, wheezing or rhonchi  ABDOMEN: Soft, non-tender, non-distended MUSCULOSKELETAL:  Ambulates independently SKIN: Warm and dry, no edema NEUROLOGIC:  No focal neuro deficits noted. PSYCHIATRIC:  Normal affect    ASSESSMENT:    1. SOB (shortness of breath)   2. Fatigue, unspecified type   3. Hypotension, unspecified hypotension type   4. Paroxysmal atrial fibrillation (HCC)   5. Coronary artery disease involving native coronary artery of native heart without angina pectoris    PLAN:    Hypotension, severe: this is life threateningly low today. Unclear what his normal baseline is. With fatigue, shortness of breath, this may be part of etiology. He has a history of UTI, recent labs show Cr 1.7 and abnormal UA. Best case scenario is if this is infectious, as it would be reversible. More concerning would be if no source of infection found -hold metoprolol, imdur -no falls/syncope, but is high risk with these numbers. Hold apixaban. -declined EMS transport. Called [facility] charge nurse to give a heads up. He needs to be assessed ASAP. He has memory issues, so wife should be included.  We will review his paroxysmal afib and history of CAD on return. Life threatening hypotension takes precedence.  Plan for follow up: 1-2 weeks  Buford Dresser, MD, PhD [place]  [facility]    Medication Adjustments/Labs and Tests Ordered: Current medicines are reviewed at length with the patient today.  Concerns regarding medicines are outlined above.  Orders Placed This Encounter  Procedures  . EKG 12-Lead   No orders of the defined types were placed in this encounter.   Patient Instructions  Medication Instructions:  Hold Imdur, Eliquis, and Metoprolol   *If you need a refill on your cardiac medications before your next  appointment, please call your pharmacy*  Lab Work: None  Testing/Procedures: None  Follow-Up: At [facility], you and your health needs are our priority.  As part of our continuing mission to provide you with exceptional heart care, we have created designated Provider Care Teams.  These Care Teams include your primary Cardiologist (physician) and Advanced Practice Providers (APPs -  Physician Assistants and Nurse Practitioners) who all work together to provide you with the care you need, when you need it.  Your next appointment:   1-2 week(s)  The format for your next appointment:   In Person  Provider:   Buford Dresser, MD     Signed, Buford Dresser, MD PhD 08/10/2019 4:39 PM    [place]

## 2019-08-11 DIAGNOSIS — N3090 Cystitis, unspecified without hematuria: Secondary | ICD-10-CM | POA: Diagnosis not present

## 2019-08-11 NOTE — ED Notes (Signed)
Patient verbalizes understanding of discharge instructions. Opportunity for questioning and answers were provided. Armband removed by staff, pt discharged from ED. Pt. ambulatory and discharged home.  

## 2019-08-13 LAB — URINE CULTURE: Culture: >=100000 — AB

## 2019-08-14 ENCOUNTER — Telehealth: Payer: Self-pay | Admitting: *Deleted

## 2019-08-14 NOTE — Telephone Encounter (Signed)
Post ED Visit - Positive Culture Follow-up: Successful Patient Follow-Up  Culture assessed and recommendations reviewed by:  []  Elenor Quinones, Pharm.D. []  Heide Guile, Pharm.D., BCPS AQ-ID []  Parks Neptune, Pharm.D., BCPS []  Alycia Rossetti, Pharm.D., BCPS []  [place], Pharm.D., BCPS, AAHIVP []  Legrand Como, Pharm.D., BCPS, AAHIVP []  Salome Arnt, PharmD, BCPS []  Johnnette Gourd, PharmD, BCPS []  Hughes Better, PharmD, BCPS []  Leeroy Cha, PharmD  Positive urine culture  []  Patient discharged without antimicrobial prescription and treatment is now indicated [x]  Organism is resistant to prescribed ED discharge antimicrobial []  Patient with positive blood cultures  Changes discussed with ED provider: Rodell Perna, PA New antibiotic prescription Amoxicillin 500mg  PO BID x 5 days Called to [facility], [address] [place]  Contacted patient, date 08/14/19, time [place], [place] 08/14/2019, 12:51 PM

## 2019-08-14 NOTE — Progress Notes (Signed)
ED Antimicrobial Stewardship Positive Culture Follow Up   Victor Wallace is an 86 y.o. male who presented to [facility] on 08/10/2019 with a chief complaint of hypotension and fatigue.  Chief Complaint  Patient presents with  . Hypotension    Recent Results (from the past 720 hour(s))  Urine culture     Status: Abnormal   Collection Time: 08/10/19  7:54 AM   Specimen: Urine, Clean Catch  Result Value Ref Range Status   Specimen Description URINE, CLEAN CATCH  Final   Special Requests NONE  Final   Culture (A)  Final    >=100,000 COLONIES/mL ENTEROCOCCUS FAECALIS >=100,000 COLONIES/mL LACTOBACILLUS SPECIES ORGANISM 2 Standardized susceptibility testing for this organism is not available. Performed at [facility], [place] [address] [place], [place] [address]    Report Status 08/13/2019 FINAL  Final   Organism ID, Bacteria ENTEROCOCCUS FAECALIS (A)  Final      Susceptibility   Enterococcus faecalis - MIC*    AMPICILLIN <=2 SENSITIVE Sensitive     NITROFURANTOIN <=16 SENSITIVE Sensitive     VANCOMYCIN <=0.5 SENSITIVE Sensitive     * >=100,000 COLONIES/mL ENTEROCOCCUS FAECALIS    Treated with nitrofurantoin (sensitive to organism) but contraindicated based on patient's renal function.  New antibiotic prescription: amoxicillin 500 mg BID x 5 days Stop nitrofurantoin  ED Provider: Rodell Perna, PA-C   [name] 08/14/2019, 9:13 AM Clinical Pharmacist Monday - Friday phone -  [phone] Saturday - Sunday phone - [phone]

## 2019-08-24 ENCOUNTER — Ambulatory Visit (INDEPENDENT_AMBULATORY_CARE_PROVIDER_SITE_OTHER): Payer: Medicare Other | Admitting: Cardiology

## 2019-08-24 ENCOUNTER — Encounter: Payer: Self-pay | Admitting: Cardiology

## 2019-08-24 ENCOUNTER — Other Ambulatory Visit: Payer: Self-pay

## 2019-08-24 VITALS — BP 179/95 | HR 78 | Ht 74.0 in | Wt 200.2 lb

## 2019-08-24 DIAGNOSIS — I10 Essential (primary) hypertension: Secondary | ICD-10-CM | POA: Diagnosis not present

## 2019-08-24 DIAGNOSIS — I2510 Atherosclerotic heart disease of native coronary artery without angina pectoris: Secondary | ICD-10-CM

## 2019-08-24 DIAGNOSIS — R0600 Dyspnea, unspecified: Secondary | ICD-10-CM | POA: Diagnosis not present

## 2019-08-24 DIAGNOSIS — R0989 Other specified symptoms and signs involving the circulatory and respiratory systems: Secondary | ICD-10-CM | POA: Diagnosis not present

## 2019-08-24 DIAGNOSIS — I480 Paroxysmal atrial fibrillation: Secondary | ICD-10-CM | POA: Diagnosis not present

## 2019-08-24 DIAGNOSIS — R0609 Other forms of dyspnea: Secondary | ICD-10-CM

## 2019-08-24 MED ORDER — NITROGLYCERIN 0.4 MG SL SUBL: 0.4000 mg | SUBLINGUAL_TABLET | SUBLINGUAL | 3 refills | Status: DC | PRN

## 2019-08-24 NOTE — Progress Notes (Signed)
Cardiology Office Note:    Date:  08/24/2019   ID:  Victor Wallace, DOB [birth year], MRN [id number]  PCP:  Haywood Pao, MD  Cardiologist:  Buford Dresser, MD  Referring MD: Haywood Pao, MD   CC: follow up  History of Present Illness:    Victor Wallace is a 86 y.o. male with a hx of prior CAD, type II diabetes, paroxysmal atrial fibrillation, chronic kidney disease who is seen for follow up. I initially saw him 08/10/19 as a new consult at the request of Tisovec, Fransico Him, MD for the evaluation and management of fatigue and dyspnea on exertion.  Cardiac history: He has had longstanding issues with both high and low blood pressure. On our initial visit, blood pressure was 67/43 on the right and 73/46 on the left. Recheck on the right 78/52. Given this and his symptoms, he was referred to ER for further evaluation. He received IV fluids and was treated for urinary tract infection.  Today: Blood pressure today 179/95, compared to 73/46 at last visit. Has home logs, reviewed today. Range 1109/71-179/91. Average 130-140/70.   Wife's main concern is for his shortness of breath. Patient's main concern is his memory.  He feels that when he starts to do something, he holds his breath/is quickly out of breath. Even basics like getting dressed, walking a short distance makes him winded. No chest pain. No syncope. Does get dizzy at times. No LE edema. Weight has been going down unintentionally.   We discussed both cardiac and noncardiac potential causes of his shortness of breath. I personally reviewed his prior echo from 2016. Discussed that this could be a structural issue (valve, reduced EF, diastolic dysfunction) or ischemia. See summary below.  Denies chest pain. No PND, orthopnea, LE edema or unexpected weight gain. No syncope or palpitations.  Past Medical History:  Diagnosis Date  . CAD (coronary artery disease), native coronary artery    Catheterization 2008  showing moderate nonobstructive disease in LAD, circumflex and right coronary artery, no lesions greater than 40%.  EF of 35% at that time.   Marland Kitchen GERD (gastroesophageal reflux disease)   . History of kidney stones   . Hypercholesterolemia   . Hyperlipidemia 12/02/2008  . Hypertension   . Kidney disease, chronic, stage III (GFR 30-59 ml/min) 08/29/2014  . Paroxysmal atrial fibrillation (HCC) 08/29/2014   Chads2VASC score 5   . Transient global amnesia 2008   . Type 2 diabetes mellitus with renal manifestations, controlled [facility])     Past Surgical History:  Procedure Laterality Date  . CYSTOSCOPY W/ URETERAL STENT PLACEMENT Left 03/06/2013   Procedure: CYSTOSCOPY WITH RETROGRADE PYELOGRAM/URETERAL STENT PLACEMENT;  Surgeon: Malka So, MD;  Location: [facility];  Service: Urology;  Laterality: Left;  . JOINT REPLACEMENT    . TONSILLECTOMY    . TOTAL HIP ARTHROPLASTY Right     Current Medications: Current Outpatient Medications on File Prior to Visit  Medication Sig  . aspirin EC 81 MG tablet Take 1 tablet (81 mg total) by mouth daily.  Marland Kitchen atorvastatin (LIPITOR) 40 MG tablet Take 40 mg by mouth daily.  . Calcium Carb-Cholecalciferol (CALCIUM 600+D3 PO) Take 1 tablet by mouth at bedtime.  . cycloSPORINE (RESTASIS) 0.05 % ophthalmic emulsion Place 2 drops 2 (two) times daily into both eyes.   Marland Kitchen docusate sodium (COLACE) 100 MG capsule Take 100 mg by mouth at bedtime.  Marland Kitchen ELIQUIS 2.5 MG TABS tablet TAKE 1 TABLET TWICE A DAY (NEED TO  SCHEDULE MD VISIT FOR FURTHER REFILLS) (Patient taking differently: Take 2.5 mg by mouth 2 (two) times daily. )  . fish oil-omega-3 fatty acids 1000 MG capsule Take 1 capsule (1 g total) by mouth daily. Stop taking if having diarrhea (Patient taking differently: Take 1 g by mouth at bedtime. )  . hypromellose (GENTEAL SEVERE) 0.3 % GEL ophthalmic ointment Place 1 application into both eyes as needed for dry eyes.  . insulin aspart (NOVOLOG) 100 UNIT/ML injection Inject 0-10  Units into the skin See admin instructions. Inject 0-10 units into the skin 3 times a day with meals and at bedtime per sliding scale, depending on carb intake: BGL <120 = give nothing; 121-150 = 1 unit; 151-200 = 2 units; 201-250 = 3 units; 251-300 = 4 units; 301-350 = 5 units; 351-400 = 6 units; >400 = 10 units  . insulin detemir (LEVEMIR) 100 UNIT/ML injection Inject 24 Units at bedtime into the skin.   Marland Kitchen linagliptin (TRADJENTA) 5 MG TABS tablet Take 1 tablet (5 mg total) by mouth daily.  . metoprolol tartrate (LOPRESSOR) 25 MG tablet Take 1 tablet (25 mg total) by mouth 2 (two) times daily.  . Multiple Vitamins-Minerals (MULTIVITAMIN WITH MINERALS) tablet Take 1 tablet by mouth daily.   . Multiple Vitamins-Minerals (PRESERVISION/LUTEIN) CAPS Take 1 capsule by mouth 2 (two) times daily.  Marland Kitchen omeprazole (PRILOSEC) 20 MG capsule Take 20 mg every evening by mouth.   . pyridOXINE (VITAMIN B-6) 100 MG tablet Take 100 mg by mouth 2 (two) times daily.   . timolol (BETIMOL) 0.5 % ophthalmic solution Place 1 drop into the left eye 2 (two) times daily.   Marland Kitchen albuterol (PROVENTIL HFA;VENTOLIN HFA) 108 (90 BASE) MCG/ACT inhaler Inhale 2 puffs into the lungs every 6 (six) hours as needed for wheezing or shortness of breath. (Patient not taking: Reported on 08/10/2019)  . [DISCONTINUED] isosorbide mononitrate (IMDUR) 30 MG 24 hr tablet Take 30 mg by mouth daily.   No current facility-administered medications on file prior to visit.     Allergies:   Shirley Friar [lifitegrast]   Social History   Tobacco Use  . Smoking status: Never Smoker  . Smokeless tobacco: Never Used  Substance Use Topics  . Alcohol use: Yes    Alcohol/week: 1.0 standard drinks    Types: 1 Cans of beer per week    Comment: occasionally  . Drug use: No    Family History: family history includes Emphysema in his brother; Heart attack in his father.  ROS:   Please see the history of present illness.  Additional pertinent ROS negative  except as noted in HPI.  EKGs/Labs/Other Studies Reviewed:    The following studies were reviewed today: Echo 2016 reviewed today  EKG:  EKG is personally reviewed.  The ekg ordered 08/10/19 demonstrates NSR, RBBB  Recent Labs: 08/10/2019: B Natriuretic Peptide 58.2; BUN 34; Creatinine, Ser 1.84; Hemoglobin 14.3; Platelets 190; Potassium 4.5; Sodium 143; TSH 4.318  Recent Lipid Panel    Component Value Date/Time   CHOL 105 08/30/2014 0052   TRIG 49 08/30/2014 0052   HDL 39 (L) 08/30/2014 0052   CHOLHDL 2.7 08/30/2014 0052   VLDL 10 08/30/2014 0052   LDLCALC 56 08/30/2014 0052    Physical Exam:    VS:  BP (!) 179/95   Pulse 78   Ht $R'6\' 2"'Fq$  (1.88 m)   Wt 200 lb 3.2 oz (90.8 kg)   SpO2 98%   BMI 25.70 kg/m     Wt  Readings from Last 3 Encounters:  08/24/19 200 lb 3.2 oz (90.8 kg)  08/10/19 200 lb (90.7 kg)  05/16/17 200 lb (90.7 kg)    GEN: Frail appearing, in no acute distress HEENT: Normal NECK: No JVD CARDIAC: regular rhythm, normal S1 and S2, no rubs or gallops. 2/6 SEM VASCULAR: Radial and DP pulses 2+ bilaterally. No carotid bruits RESPIRATORY:  Clear to auscultation without rales, wheezing or rhonchi  ABDOMEN: Soft, non-tender, non-distended MUSCULOSKELETAL:  Ambulates independently SKIN: Warm and dry, no edema NEUROLOGIC:  No focal neuro deficits noted. PSYCHIATRIC:  Normal affect    ASSESSMENT:    1. DOE (dyspnea on exertion)   2. Nonocclusive coronary atherosclerosis of native coronary artery   3. Paroxysmal atrial fibrillation (HCC)   4. Labile blood pressure   5. Essential hypertension    PLAN:    Dyspnea on exertion: unclear etiology. May be cardiac, either structural or ischemia. However, cannot exclude other etiologies. Of note, he is very deconditioned, uses wheelchair most of the day. -echocardiogram to rule out structural abnormalities -lexiscan nuclear stress to evaluate for ischemia. We discussed at length today. With his baseline chronic kidney  disease and Cr of 1.84, we all agreed that stress test will be informative, but they would not want to risk kidney injury with cath/contrast if possible, even if stress test is abnormal  Prior known nonocclusive CAD -currently on aspirin, atorvastatin 40 mg -denies chest pain -imdur held with recent hypotension -refilling SL NG today, has not required.  Paroxysmal atrial fibrillation:  -CHA2DS2/VAS Stroke Risk Points= 5 -was on apixaban 2.5 mg given age and renal function, held with recent severe hypotension -if no ischemia on stress test, restart apixaban (and consider stopping aspirin) at follow up -on metoprolol for rate control  Labile blood pressure: was very hypotensive on initial visit, required ER evaluation. Found to have UTI. Today he is hypertensive. Recent numbers at home have been reasonably controlled -with recent labile numbers, will not overcorrect. Will follow up closely  Plan for follow up: 3-4 weeks, after tests completed. [place] for virtual or in person  Total time of encounter: 42 minutes total time of encounter, including 37 minutes spent in face-to-face patient care. This time includes coordination of care and counseling regarding his symptoms, workup, risk/benefits of testing. Remainder of non-face-to-face time involved reviewing chart documents/testing relevant to the patient encounter and documentation in the medical record.  Buford Dresser, MD, PhD [place]  [facility]    Medication Adjustments/Labs and Tests Ordered: Current medicines are reviewed at length with the patient today.  Concerns regarding medicines are outlined above.  Orders Placed This Encounter  Procedures  . MYOCARDIAL PERFUSION IMAGING  . ECHOCARDIOGRAM COMPLETE   Meds ordered this encounter  Medications  . nitroGLYCERIN (NITROSTAT) 0.4 MG SL tablet    Sig: Place 1 tablet (0.4 mg total) under the tongue every 5 (five) minutes x 3 doses as needed for chest pain.    Dispense:   25 tablet    Refill:  3    Patient Instructions  Medication Instructions:  Your Physician recommend you continue on your current medication as directed.    *If you need a refill on your cardiac medications before your next appointment, please call your pharmacy*  Lab Work: None  If you have labs (blood work) drawn today and your tests are completely normal, you will receive your results only by: Marland Kitchen MyChart Message (if you have MyChart) OR . A paper copy in the mail If  you have any lab test that is abnormal or we need to change your treatment, we will call you to review the results.  Testing/Procedures: Your physician has requested that you have an echocardiogram. Echocardiography is a painless test that uses sound waves to create images of your heart. It provides your doctor with information about the size and shape of your heart and how well your heart's chambers and valves are working. This procedure takes approximately one hour. There are no restrictions for this procedure. [place] has requested that you have a lexiscan myoview. For further information please visit [email or web address]. Please follow instruction sheet, as given. [place] [address]   Follow-Up: At [facility], you and your health needs are our priority.  As part of our continuing mission to provide you with exceptional heart care, we have created designated Provider Care Teams.  These Care Teams include your primary Cardiologist (physician) and Advanced Practice Providers (APPs -  Physician Assistants and Nurse Practitioners) who all work together to provide you with the care you need, when you need it.  Your next appointment:   3-4 week(s)  The format for your next appointment:   Virtual Visit   Provider:   Buford Dresser, MD   You are scheduled for a Myocardial Perfusion Imaging Study.  Please arrive 15 minutes prior to your appointment time for  registration and insurance purposes.  The test will take approximately 3 to 4 hours to complete; you may bring reading material.  If someone comes with you to your appointment, they will need to remain in the main lobby due to limited space in the testing area. **If you are pregnant or breastfeeding, please notify the nuclear lab prior to your appointment**  How to prepare for your Myocardial Perfusion Test: . Do not eat or drink 3 hours prior to your test, except you may have water. . Do not consume products containing caffeine (regular or decaffeinated) 12 hours prior to your test. (ex: coffee, chocolate, sodas, tea). . Do bring a list of your current medications with you.  If not listed below, you may take your medications as normal. . Do wear comfortable clothes (no dresses or overalls) and walking shoes, tennis shoes preferred (No heels or open toe shoes are allowed). . Do NOT wear cologne, perfume, aftershave, or lotions (deodorant is allowed). . If these instructions are not followed, your test will have to be rescheduled.  Please report to [address], [address] for your test.  If you have questions or concerns about your appointment, you can call the Nuclear Lab at [phone].  If you cannot keep your appointment, please provide 24 hours notification to the Nuclear Lab, to avoid a possible $50 charge to your account.    Signed, Buford Dresser, MD PhD 08/24/2019 6:35 PM    [place]

## 2019-08-24 NOTE — Patient Instructions (Signed)
Medication Instructions:  Your Physician recommend you continue on your current medication as directed.    *If you need a refill on your cardiac medications before your next appointment, please call your pharmacy*  Lab Work: None  If you have labs (blood work) drawn today and your tests are completely normal, you will receive your results only by: Marland Kitchen MyChart Message (if you have MyChart) OR . A paper copy in the mail If you have any lab test that is abnormal or we need to change your treatment, we will call you to review the results.  Testing/Procedures: Your physician has requested that you have an echocardiogram. Echocardiography is a painless test that uses sound waves to create images of your heart. It provides your doctor with information about the size and shape of your heart and how well your heart's chambers and valves are working. This procedure takes approximately one hour. There are no restrictions for this procedure. [place] has requested that you have a lexiscan myoview. For further information please visit [email or web address]. Please follow instruction sheet, as given. [place] [address]   Follow-Up: At [facility], you and your health needs are our priority.  As part of our continuing mission to provide you with exceptional heart care, we have created designated Provider Care Teams.  These Care Teams include your primary Cardiologist (physician) and Advanced Practice Providers (APPs -  Physician Assistants and Nurse Practitioners) who all work together to provide you with the care you need, when you need it.  Your next appointment:   3-4 week(s)  The format for your next appointment:   Virtual Visit   Provider:   Buford Dresser, MD   You are scheduled for a Myocardial Perfusion Imaging Study.  Please arrive 15 minutes prior to your appointment time for registration and insurance purposes.  The test will  take approximately 3 to 4 hours to complete; you may bring reading material.  If someone comes with you to your appointment, they will need to remain in the main lobby due to limited space in the testing area. **If you are pregnant or breastfeeding, please notify the nuclear lab prior to your appointment**  How to prepare for your Myocardial Perfusion Test: . Do not eat or drink 3 hours prior to your test, except you may have water. . Do not consume products containing caffeine (regular or decaffeinated) 12 hours prior to your test. (ex: coffee, chocolate, sodas, tea). . Do bring a list of your current medications with you.  If not listed below, you may take your medications as normal. . Do wear comfortable clothes (no dresses or overalls) and walking shoes, tennis shoes preferred (No heels or open toe shoes are allowed). . Do NOT wear cologne, perfume, aftershave, or lotions (deodorant is allowed). . If these instructions are not followed, your test will have to be rescheduled.  Please report to [address], [address] for your test.  If you have questions or concerns about your appointment, you can call the Nuclear Lab at [phone].  If you cannot keep your appointment, please provide 24 hours notification to the Nuclear Lab, to avoid a possible $50 charge to your account.

## 2019-09-07 ENCOUNTER — Telehealth (HOSPITAL_COMMUNITY): Payer: Self-pay

## 2019-09-07 NOTE — Telephone Encounter (Signed)
Spoke with the patient' wife, detailed instructions were given. She stated that he would be here for his test. Asked to call back with any questions. EMTP

## 2019-09-08 ENCOUNTER — Ambulatory Visit (HOSPITAL_BASED_OUTPATIENT_CLINIC_OR_DEPARTMENT_OTHER): Payer: Medicare Other

## 2019-09-08 ENCOUNTER — Other Ambulatory Visit: Payer: Self-pay

## 2019-09-08 ENCOUNTER — Ambulatory Visit (HOSPITAL_COMMUNITY): Payer: Medicare Other | Attending: Cardiology

## 2019-09-08 VITALS — Ht 74.0 in | Wt 200.0 lb

## 2019-09-08 DIAGNOSIS — R0609 Other forms of dyspnea: Secondary | ICD-10-CM

## 2019-09-08 DIAGNOSIS — R0600 Dyspnea, unspecified: Secondary | ICD-10-CM | POA: Insufficient documentation

## 2019-09-08 LAB — MYOCARDIAL PERFUSION IMAGING
LV dias vol: 61 mL (ref 62–150)
LV sys vol: 20 mL
Peak HR: 76 {beats}/min
Rest HR: 71 {beats}/min
SDS: 1
SRS: 0
SSS: 1
TID: 0.95

## 2019-09-08 LAB — ECHOCARDIOGRAM COMPLETE
Height: 74 in
Weight: 3200 oz

## 2019-09-08 MED ORDER — TECHNETIUM TC 99M TETROFOSMIN IV KIT
10.1000 | PACK | Freq: Once | INTRAVENOUS | Status: AC | PRN
  Administered 2019-09-08: 10.1 via INTRAVENOUS
  Filled 2019-09-08: qty 11

## 2019-09-08 MED ORDER — TECHNETIUM TC 99M TETROFOSMIN IV KIT
30.4000 | PACK | Freq: Once | INTRAVENOUS | Status: AC | PRN
  Administered 2019-09-08: 30.4 via INTRAVENOUS
  Filled 2019-09-08: qty 31

## 2019-09-08 MED ORDER — REGADENOSON 0.4 MG/5ML IV SOLN
0.4000 mg | Freq: Once | INTRAVENOUS | Status: AC
  Administered 2019-09-08: 0.4 mg via INTRAVENOUS

## 2019-09-14 ENCOUNTER — Telehealth (INDEPENDENT_AMBULATORY_CARE_PROVIDER_SITE_OTHER): Payer: Medicare Other | Admitting: Cardiology

## 2019-09-14 VITALS — BP 130/75 | HR 63 | Ht 74.0 in | Wt 200.0 lb

## 2019-09-14 DIAGNOSIS — Z7982 Long term (current) use of aspirin: Secondary | ICD-10-CM

## 2019-09-14 DIAGNOSIS — I2510 Atherosclerotic heart disease of native coronary artery without angina pectoris: Secondary | ICD-10-CM

## 2019-09-14 DIAGNOSIS — Z7189 Other specified counseling: Secondary | ICD-10-CM

## 2019-09-14 DIAGNOSIS — Z712 Person consulting for explanation of examination or test findings: Secondary | ICD-10-CM | POA: Diagnosis not present

## 2019-09-14 DIAGNOSIS — I480 Paroxysmal atrial fibrillation: Secondary | ICD-10-CM

## 2019-09-14 DIAGNOSIS — R0609 Other forms of dyspnea: Secondary | ICD-10-CM

## 2019-09-14 DIAGNOSIS — R5383 Other fatigue: Secondary | ICD-10-CM

## 2019-09-14 DIAGNOSIS — R0600 Dyspnea, unspecified: Secondary | ICD-10-CM | POA: Diagnosis not present

## 2019-09-14 NOTE — Progress Notes (Signed)
Virtual Visit via Video Note   This visit type was conducted due to national recommendations for restrictions regarding the COVID-19 Pandemic (e.g. social distancing) in an effort to limit this patient's exposure and mitigate transmission in our community.  Due to his co-morbid illnesses, this patient is at least at moderate risk for complications without adequate follow up.  This format is felt to be most appropriate for this patient at this time.  All issues noted in this document were discussed and addressed.  A limited physical exam was performed with this format.  Please refer to the patient's chart for his consent to telehealth for [facility].   The patient was identified using 2 identifiers.  Date:  09/14/2019   ID:  Victor Wallace, DOB Apr 22, 1925, MRN [id number]  Patient Location: Home Provider Location: Home  PCP:  Tisovec, Fransico Him, MD  Cardiologist:  Buford Dresser, MD  Electrophysiologist:  None   Evaluation Performed:  Follow-Up Visit  Chief Complaint:  followup  History of Present Illness:    Victor Wallace is a 86 y.o. male with a hx of prior CAD, type II diabetes, paroxysmal atrial fibrillation, chronic kidney disease who is seen for follow up. I initially saw him 08/10/19 as a new consult at the request of Tisovec, Fransico Him, MD for the evaluation and management of fatigue and dyspnea on exertion.  Cardiac history: He has had longstanding issues with both high and low blood pressure. On our initial visit, blood pressure was 67/43 on the right and 73/46 on the left. Recheck on the right 78/52. Given this and his symptoms, he was referred to ER for further evaluation. He received IV fluids and was treated for urinary tract infection.  The patient does not have symptoms concerning for COVID-19 infection (fever, chills, cough, or new shortness of breath).   Has been able to get back and forth to the back porch, but gets tired easily, which has been his  long term issue. They are most worried about his fatigue/dyspnea on exertion. We reviewed the results of his echo and stress test today. Together we discussed that this does not appear to be from a cardiac cause.  Bps have been 110s-140s/70s, no very high/low numbers.  Denies chest pain, PND, orthopnea, LE edema or unexpected weight gain. No syncope or palpitations.   Past Medical History:  Diagnosis Date  . CAD (coronary artery disease), native coronary artery    Catheterization 2008 showing moderate nonobstructive disease in LAD, circumflex and right coronary artery, no lesions greater than 40%.  EF of 35% at that time.   Marland Kitchen GERD (gastroesophageal reflux disease)   . History of kidney stones   . Hypercholesterolemia   . Hyperlipidemia 12/02/2008  . Hypertension   . Kidney disease, chronic, stage III (GFR 30-59 ml/min) 08/29/2014  . Paroxysmal atrial fibrillation (HCC) 08/29/2014   Chads2VASC score 5   . Transient global amnesia 2008   . Type 2 diabetes mellitus with renal manifestations, controlled [facility])    Past Surgical History:  Procedure Laterality Date  . CYSTOSCOPY W/ URETERAL STENT PLACEMENT Left 03/06/2013   Procedure: CYSTOSCOPY WITH RETROGRADE PYELOGRAM/URETERAL STENT PLACEMENT;  Surgeon: Malka So, MD;  Location: [facility];  Service: Urology;  Laterality: Left;  . JOINT REPLACEMENT    . TONSILLECTOMY    . TOTAL HIP ARTHROPLASTY Right      No outpatient medications have been marked as taking for the 09/14/19 encounter (Telemedicine) with Buford Dresser, MD.  Allergies:   Shirley Friar [lifitegrast]   Social History   Tobacco Use  . Smoking status: Never Smoker  . Smokeless tobacco: Never Used  Substance Use Topics  . Alcohol use: Yes    Alcohol/week: 1.0 standard drinks    Types: 1 Cans of beer per week    Comment: occasionally  . Drug use: No     Family Hx: The patient's family history includes Emphysema in his brother; Heart attack in his father.  ROS:     Please see the history of present illness.    All other systems reviewed and are negative.   Prior CV studies:   The following studies were reviewed today: Stress test 09/08/19  Nuclear stress EF: 67%.  Defect 1: There is a medium defect of moderate severity present in the basal inferior, mid inferior and apical inferior location. Findings consistent with either diaphragmatic attenuation or prior infarct. There is no ischemia.  This is a low risk study.  There was no ST segment deviation noted during stress.  The left ventricular ejection fraction is normal (55-65%).  Echo 09/08/19 1. Left ventricular ejection fraction, by estimation, is 60 to 65%. The  left ventricle has normal function. The left ventricle has no regional  wall motion abnormalities. There is mild left ventricular hypertrophy.  Left ventricular diastolic parameters  are consistent with Grade I diastolic dysfunction (impaired relaxation).  2. Right ventricular systolic function is normal. The right ventricular  size is normal. There is normal pulmonary artery systolic pressure.  3. The mitral valve is normal in structure. No evidence of mitral valve  regurgitation.  4. The aortic valve is tricuspid. Aortic valve regurgitation is not  visualized. Moderate aortic valve sclerosis/calcification is present,  without any evidence of aortic stenosis (Vmax 1.9 m/s, MG 7 mmHg, DI 0.65)   Labs/Other Tests and Data Reviewed:    EKG:  An ECG dated 08/10/19 was personally reviewed today and demonstrated:  NSR, RBBB, LAFB  Recent Labs: 08/10/2019: B Natriuretic Peptide 58.2; BUN 34; Creatinine, Ser 1.84; Hemoglobin 14.3; Platelets 190; Potassium 4.5; Sodium 143; TSH 4.318   Recent Lipid Panel Lab Results  Component Value Date/Time   CHOL 105 08/30/2014 12:52 AM   TRIG 49 08/30/2014 12:52 AM   HDL 39 (L) 08/30/2014 12:52 AM   CHOLHDL 2.7 08/30/2014 12:52 AM   LDLCALC 56 08/30/2014 12:52 AM    Wt Readings from Last 3  Encounters:  09/14/19 200 lb (90.7 kg)  09/08/19 200 lb (90.7 kg)  08/24/19 200 lb 3.2 oz (90.8 kg)     Objective:    Vital Signs:  BP 130/75   Pulse 63   Ht $R'6\' 2"'Ch$  (1.88 m)   Wt 200 lb (90.7 kg)   BMI 25.68 kg/m    VITAL SIGNS:  reviewed GEN:  no acute distress EYES:  sclerae anicteric, EOMI - Extraocular Movements Intact RESPIRATORY:  normal respiratory effort, symmetric expansion CARDIOVASCULAR:  no visible JVD SKIN:  no rash, lesions or ulcers. MUSCULOSKELETAL:  no obvious deformities. NEURO:  alert and oriented x 3, no obvious focal deficit PSYCH:  normal affect  ASSESSMENT & PLAN:    Dyspnea on exertion, fatigue: we reviewed the results of his testing today. Does not appear based on echo or stress test that this is cardiac in etiology. All questions answered.  History of nonocclusive CAD: no angina. Continue aspirin, atorvastatin  Paroxysmal atrial fibrillation:  -CHA2DS2/VAS Stroke Risk Points= 5 -has restarted apixaban, 2.5 mg BID based on age and  renal function. If he has issues with bleeding, would stop aspirin -continue metoprolol for rate control  COVID-19 Education: The signs and symptoms of COVID-19 were discussed with the patient and how to seek care for testing (follow up with PCP or arrange E-visit).  The importance of social distancing was discussed today.  Time:   Today, I have spent 9 minutes with the patient with telehealth technology discussing the above problems.    Patient Instructions  Medication Instructions:  Your Physician recommend you continue on your current medication as directed.    *If you need a refill on your cardiac medications before your next appointment, please call your pharmacy*   Lab Work: None   Testing/Procedures: None   Follow-Up: At [facility], you and your health needs are our priority.  As part of our continuing mission to provide you with exceptional heart care, we have created designated Provider Care  Teams.  These Care Teams include your primary Cardiologist (physician) and Advanced Practice Providers (APPs -  Physician Assistants and Nurse Practitioners) who all work together to provide you with the care you need, when you need it.  We recommend signing up for the patient portal called "MyChart".  Sign up information is provided on this After Visit Summary.  MyChart is used to connect with patients for Virtual Visits (Telemedicine).  Patients are able to view lab/test results, encounter notes, upcoming appointments, etc.  Non-urgent messages can be sent to your provider as well.   To learn more about what you can do with MyChart, go to [email or web address].    Your next appointment:   1 year(s)  The format for your next appointment:   In Person  Provider:   Buford Dresser, MD       Follow Up:  1 year or sooner PRN  Signed, Buford Dresser, MD  09/14/2019  [place]

## 2019-09-14 NOTE — Patient Instructions (Signed)

## 2019-09-23 ENCOUNTER — Encounter: Payer: Self-pay | Admitting: Cardiology

## 2019-09-28 ENCOUNTER — Encounter: Payer: Self-pay | Admitting: Podiatry

## 2019-09-28 ENCOUNTER — Ambulatory Visit (INDEPENDENT_AMBULATORY_CARE_PROVIDER_SITE_OTHER): Payer: Medicare Other | Admitting: Podiatry

## 2019-09-28 ENCOUNTER — Other Ambulatory Visit: Payer: Self-pay

## 2019-09-28 DIAGNOSIS — B351 Tinea unguium: Secondary | ICD-10-CM | POA: Diagnosis not present

## 2019-09-28 DIAGNOSIS — M79675 Pain in left toe(s): Secondary | ICD-10-CM

## 2019-09-28 DIAGNOSIS — M79674 Pain in right toe(s): Secondary | ICD-10-CM

## 2019-09-28 DIAGNOSIS — I2510 Atherosclerotic heart disease of native coronary artery without angina pectoris: Secondary | ICD-10-CM

## 2019-09-28 DIAGNOSIS — L600 Ingrowing nail: Secondary | ICD-10-CM | POA: Diagnosis not present

## 2019-09-30 NOTE — Progress Notes (Signed)
Subjective:   Patient ID: Victor Wallace, male   DOB: 86 y.o.   MRN: [id number]   HPI Patient presents stating his nails get very thick or impossible for him to cut and can be painful also he is concerned in general about some lesions under his feet and whether or not he might have ulcers.  Patient is not in good health and not very communicative does not smoke   ROS      Objective:  Physical Exam Vitals and nursing note reviewed.  Constitutional:      Appearance: He is well-developed.  Pulmonary:     Effort: Pulmonary effort is normal.  Musculoskeletal:        General: Normal range of motion.  Skin:    General: Skin is warm.  Neurological:     Mental Status: He is alert.     Vascular status found to be moderately diminished motion both PT DP pulses with cap fill time adequate but slightly reduced with patient found to have thick yellow brittle nailbeds 1-5 both feet that are moderately tender when pressed and make it hard to wear shoe gear comfortably.  Patient has some stress on the bottom of his feet secondary to his gait but I did not see any obvious breaks in tissue     Assessment:  Mycotic nail infection 1-5 both feet with nail disease that are painful and moderate vascular disease commensurate with age with no indication of ulceration     Plan:  H&P all conditions reviewed explained and I do not recommend treatment for underneath except for good supportive shoes and today I went ahead and I did debride nailbeds 1-5 both feet with no iatrogenic bleeding noted

## 2019-10-15 DIAGNOSIS — I480 Paroxysmal atrial fibrillation: Secondary | ICD-10-CM | POA: Diagnosis not present

## 2019-10-15 DIAGNOSIS — N390 Urinary tract infection, site not specified: Secondary | ICD-10-CM | POA: Diagnosis not present

## 2019-10-15 DIAGNOSIS — R310 Gross hematuria: Secondary | ICD-10-CM | POA: Diagnosis not present

## 2019-10-19 IMAGING — DX DG CHEST 2V
2 series · 2 of 2 positions shown · non-contrast
Comparison: 04/28/2015

CLINICAL DATA: Weakness with nausea and vomiting

EXAM:
CHEST  2 VIEW

[w chest lat]
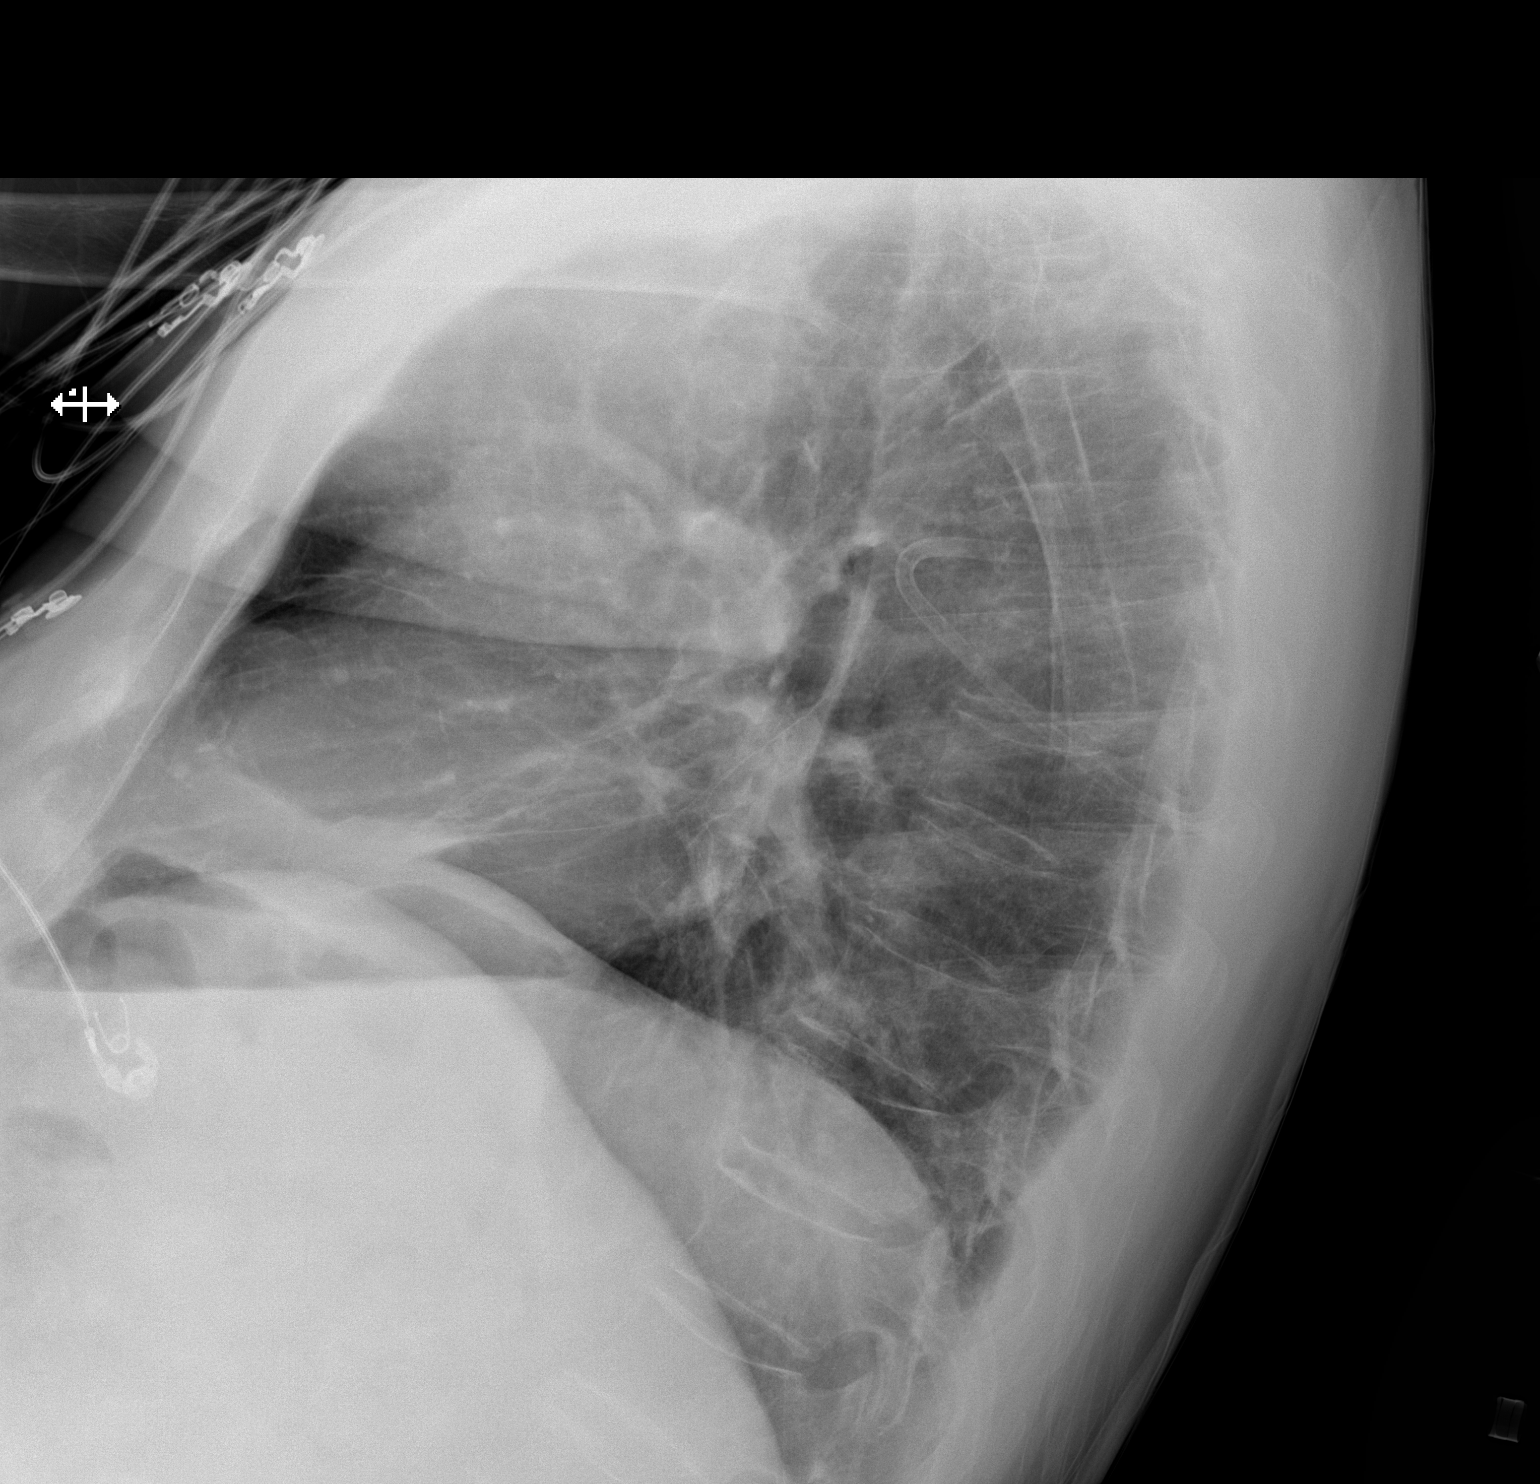

[x chest ap]
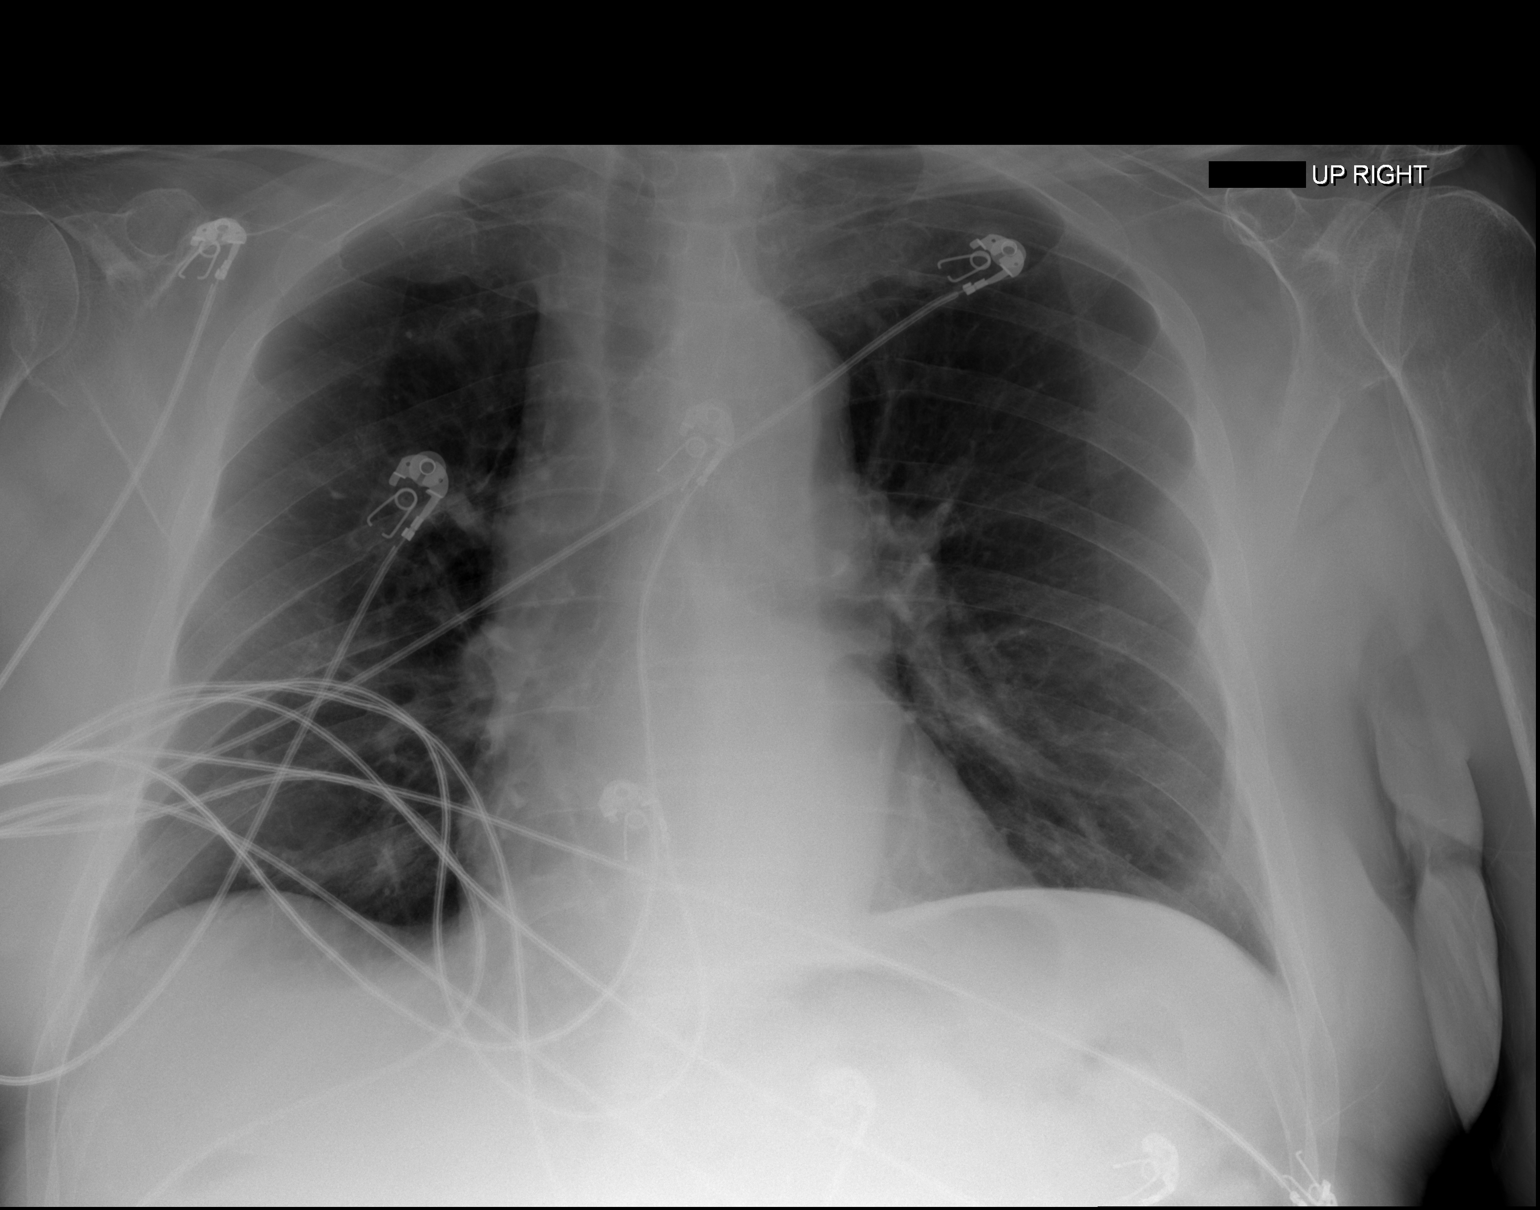

[2 of 2 positions shown; findings below may reference images not displayed]

FINDINGS: The heart size and mediastinal contours are within normal limits.
Both lungs are clear. The visualized skeletal structures are
unremarkable.
IMPRESSION: No active cardiopulmonary disease.

## 2019-10-27 DIAGNOSIS — I2510 Atherosclerotic heart disease of native coronary artery without angina pectoris: Secondary | ICD-10-CM | POA: Diagnosis not present

## 2019-10-27 DIAGNOSIS — R82998 Other abnormal findings in urine: Secondary | ICD-10-CM | POA: Diagnosis not present

## 2019-10-27 DIAGNOSIS — N390 Urinary tract infection, site not specified: Secondary | ICD-10-CM | POA: Diagnosis not present

## 2019-10-27 DIAGNOSIS — G629 Polyneuropathy, unspecified: Secondary | ICD-10-CM | POA: Diagnosis not present

## 2019-10-27 DIAGNOSIS — N1832 Chronic kidney disease, stage 3b: Secondary | ICD-10-CM | POA: Diagnosis not present

## 2019-10-27 DIAGNOSIS — R809 Proteinuria, unspecified: Secondary | ICD-10-CM | POA: Diagnosis not present

## 2019-10-27 DIAGNOSIS — R310 Gross hematuria: Secondary | ICD-10-CM | POA: Diagnosis not present

## 2019-10-27 DIAGNOSIS — Z794 Long term (current) use of insulin: Secondary | ICD-10-CM | POA: Diagnosis not present

## 2019-10-27 DIAGNOSIS — R413 Other amnesia: Secondary | ICD-10-CM | POA: Diagnosis not present

## 2019-10-27 DIAGNOSIS — E7800 Pure hypercholesterolemia, unspecified: Secondary | ICD-10-CM | POA: Diagnosis not present

## 2019-10-27 DIAGNOSIS — I480 Paroxysmal atrial fibrillation: Secondary | ICD-10-CM | POA: Diagnosis not present

## 2019-10-27 DIAGNOSIS — Z7901 Long term (current) use of anticoagulants: Secondary | ICD-10-CM | POA: Diagnosis not present

## 2019-10-27 DIAGNOSIS — E1129 Type 2 diabetes mellitus with other diabetic kidney complication: Secondary | ICD-10-CM | POA: Diagnosis not present

## 2019-12-29 ENCOUNTER — Other Ambulatory Visit: Payer: Self-pay

## 2019-12-29 ENCOUNTER — Ambulatory Visit (INDEPENDENT_AMBULATORY_CARE_PROVIDER_SITE_OTHER): Payer: Medicare Other | Admitting: Podiatry

## 2019-12-29 ENCOUNTER — Encounter: Payer: Self-pay | Admitting: Podiatry

## 2019-12-29 DIAGNOSIS — B351 Tinea unguium: Secondary | ICD-10-CM

## 2019-12-29 DIAGNOSIS — E1122 Type 2 diabetes mellitus with diabetic chronic kidney disease: Secondary | ICD-10-CM

## 2019-12-29 DIAGNOSIS — Z794 Long term (current) use of insulin: Secondary | ICD-10-CM | POA: Diagnosis not present

## 2019-12-29 DIAGNOSIS — L600 Ingrowing nail: Secondary | ICD-10-CM

## 2019-12-29 DIAGNOSIS — N183 Chronic kidney disease, stage 3 unspecified: Secondary | ICD-10-CM

## 2019-12-29 DIAGNOSIS — M79675 Pain in left toe(s): Secondary | ICD-10-CM | POA: Diagnosis not present

## 2019-12-29 DIAGNOSIS — M79674 Pain in right toe(s): Secondary | ICD-10-CM

## 2019-12-29 NOTE — Patient Instructions (Signed)
EPSOM SALT FOOT SOAK INSTRUCTIONS  Shopping List:  A. Plain epsom salt (not scented) B. Neosporin Cream/Ointment or Bacitracin Cream/Ointment C. 1-inch fabric band-aids   1.  Place 1/4 cup of epsom salts in 2 quarts of warm tap water. IF YOU ARE DIABETIC, OR HAVE NEUROPATHY, CHECK THE TEMPERATURE OF THE WATER WITH YOUR ELBOW.  2.  Submerge your foot/feet in the solution and soak for 10-15 minutes.      3.  Next, remove your foot or feet from solution, blot dry the affected area.    4.  Apply antibiotic ointment and cover with fabric band-aid .  5.  This soak should be done once a day for 7 days.   6.  Monitor for any signs/symptoms of infection such as redness, swelling, odor, drainage, increased pain, or non-healing of digit.   7.  Please do not hesitate to call the office and speak to a Nurse or Doctor if you have questions.   8.  If you experience fever, chills, nightsweats, nausea or vomiting with worsening of digit, please go to the emergency room.

## 2020-01-02 NOTE — Progress Notes (Signed)
Subjective: Victor Wallace is a pleasant 86 y.o. male patient seen today painful thick toenails that are difficult to trim. Pain interferes with ambulation. Aggravating factors include wearing enclosed shoe gear. Pain is relieved with periodic professional debridement.  Past Medical History:  Diagnosis Date  . CAD (coronary artery disease), native coronary artery    Catheterization 2008 showing moderate nonobstructive disease in LAD, circumflex and right coronary artery, no lesions greater than 40%.  EF of 35% at that time.   Marland Kitchen GERD (gastroesophageal reflux disease)   . History of kidney stones   . Hypercholesterolemia   . Hyperlipidemia 12/02/2008  . Hypertension   . Kidney disease, chronic, stage III (GFR 30-59 ml/min) 08/29/2014  . Paroxysmal atrial fibrillation (HCC) 08/29/2014   Chads2VASC score 5   . Transient global amnesia 2008   . Type 2 diabetes mellitus with renal manifestations, controlled [facility])     Patient Active Problem List   Diagnosis Date Noted  . Labile blood pressure 08/24/2019  . DOE (dyspnea on exertion) 08/24/2019  . UTI (urinary tract infection) 04/29/2015  . SIRS (systemic inflammatory response syndrome) ([place]) 04/29/2015  . Bronchitis, chronic obstructive w acute bronchitis ([place]) 04/29/2015  . Arterial hypotension   . Hypotension 04/28/2015  . Chronic anticoagulation 09/21/2014  . Kidney disease, chronic, stage III (GFR 30-59 ml/min) 08/29/2014  . Paroxysmal atrial fibrillation ([place]) 08/29/2014  . Hypertension 08/29/2014  . Unstable angina pectoris ([place]) 08/29/2014  . History of kidney stones   . GERD (gastroesophageal reflux disease) 01/31/2012  . Type 2 diabetes mellitus with renal manifestations, controlled ([place])   . Hyperlipidemia 12/02/2008  . Nonocclusive coronary atherosclerosis of native coronary artery     Current Outpatient Medications on File Prior to Visit  Medication Sig Dispense Refill  . albuterol (PROVENTIL HFA;VENTOLIN HFA) 108 (90  BASE) MCG/ACT inhaler Inhale 2 puffs into the lungs every 6 (six) hours as needed for wheezing or shortness of breath. 1 Inhaler 2  . aspirin EC 81 MG tablet Take 1 tablet (81 mg total) by mouth daily.    Marland Kitchen atorvastatin (LIPITOR) 40 MG tablet Take 40 mg by mouth daily.    . Calcium Carb-Cholecalciferol (CALCIUM 600+D3 PO) Take 1 tablet by mouth at bedtime.    . cycloSPORINE (RESTASIS) 0.05 % ophthalmic emulsion Place 2 drops 2 (two) times daily into both eyes.     Marland Kitchen docusate sodium (COLACE) 100 MG capsule Take 100 mg by mouth at bedtime.    Marland Kitchen ELIQUIS 2.5 MG TABS tablet TAKE 1 TABLET TWICE A DAY (NEED TO SCHEDULE MD VISIT FOR FURTHER REFILLS) (Patient taking differently: Take 2.5 mg by mouth 2 (two) times daily. ) 60 tablet 0  . fish oil-omega-3 fatty acids 1000 MG capsule Take 1 capsule (1 g total) by mouth daily. Stop taking if having diarrhea (Patient taking differently: Take 1 g by mouth at bedtime. )    . hypromellose (GENTEAL SEVERE) 0.3 % GEL ophthalmic ointment Place 1 application into both eyes as needed for dry eyes.    . insulin aspart (NOVOLOG) 100 UNIT/ML injection Inject 0-10 Units into the skin See admin instructions. Inject 0-10 units into the skin 3 times a day with meals and at bedtime per sliding scale, depending on carb intake: BGL <120 = give nothing; 121-150 = 1 unit; 151-200 = 2 units; 201-250 = 3 units; 251-300 = 4 units; 301-350 = 5 units; 351-400 = 6 units; >400 = 10 units    . insulin detemir (LEVEMIR) 100 UNIT/ML  injection Inject 24 Units at bedtime into the skin.     Marland Kitchen linagliptin (TRADJENTA) 5 MG TABS tablet Take 1 tablet (5 mg total) by mouth daily. 30 tablet 3  . metoprolol succinate (TOPROL-XL) 25 MG 24 hr tablet     . metoprolol tartrate (LOPRESSOR) 25 MG tablet Take 1 tablet (25 mg total) by mouth 2 (two) times daily. 60 tablet 0  . Multiple Vitamins-Minerals (MULTIVITAMIN WITH MINERALS) tablet Take 1 tablet by mouth daily.     . nitroGLYCERIN (NITROSTAT) 0.4 MG SL  tablet Place 1 tablet (0.4 mg total) under the tongue every 5 (five) minutes x 3 doses as needed for chest pain. 25 tablet 3  . omeprazole (PRILOSEC) 20 MG capsule Take 20 mg every evening by mouth.     . pyridOXINE (VITAMIN B-6) 100 MG tablet Take 100 mg by mouth 2 (two) times daily.     . timolol (BETIMOL) 0.5 % ophthalmic solution Place 1 drop into the left eye 2 (two) times daily.     . timolol (TIMOPTIC) 0.5 % ophthalmic solution     . [DISCONTINUED] isosorbide mononitrate (IMDUR) 30 MG 24 hr tablet Take 30 mg by mouth daily.     No current facility-administered medications on file prior to visit.    Allergies  Allergen Reactions  . Shirley Friar [Lifitegrast] Other (See Comments)    Made the eyes burn    Objective: Physical Exam  General: Victor Wallace is a pleasant 86 y.o. Caucasian male, WD, WN in NAD. AAO x 3.   Vascular:  Capillary refill time to digits <4 seconds b/l lower extremities. Palpable pedal pulses b/l LE. DP pulse diminished left foot. DP pulse diminished right foot. PT pulse diminished left foot. PT pulse diminished right foot. Pedal hair absent. Lower extremity skin temperature gradient warm to cool. No pain with calf compression b/l.  Dermatological:  Pedal skin with normal turgor, texture and tone bilaterally. No open wounds bilaterally. No interdigital macerations bilaterally. Toenails 1-5 b/l elongated, discolored, dystrophic, thickened, crumbly with subungual debris and tenderness to dorsal palpation.    Incurvated nailplate b/l great toes with tenderness to palpation. No erythema, no edema, no drainage noted. There is tenderness to palpation.   Musculoskeletal:  Normal muscle strength 5/5 to all lower extremity muscle groups bilaterally. No pain crepitus or joint limitation noted with ROM b/l. No gross bony deformities bilaterally.  Neurological:  Protective sensation intact 5/5 intact bilaterally with 10g monofilament b/l.  Assessment and Plan:  1. Pain  due to onychomycosis of toenails of both feet   2. Ingrown nail   3. Controlled type 2 diabetes mellitus with stage 3 chronic kidney disease, with long-term current use of insulin ([place])    -Examined patient. -Continue diabetic foot care principles. -Toenails 1-5 b/l were debrided in length and girth with sterile nail nippers and dremel without iatrogenic bleeding.  -Offending nail border debrided and curretaged L hallux and R hallux utilizing sterile nail nipper and currette. Border(s) cleansed with alcohol and triple antibiotic ointment applied. Dispensed written instructions for once daily epsom salt soaks for 7 days. Call office if symptoms worsen.  -Patient to continue soft, supportive shoe gear daily. -Patient/POA to call should there be question/concern in the interim.  Return in about 3 months (around 03/30/2020) for diabetic nail trim.  Marzetta Board, DPM

## 2020-01-07 DIAGNOSIS — M25511 Pain in right shoulder: Secondary | ICD-10-CM | POA: Insufficient documentation

## 2020-02-03 DIAGNOSIS — K5900 Constipation, unspecified: Secondary | ICD-10-CM | POA: Diagnosis not present

## 2020-02-03 DIAGNOSIS — I480 Paroxysmal atrial fibrillation: Secondary | ICD-10-CM | POA: Diagnosis not present

## 2020-02-03 DIAGNOSIS — E7800 Pure hypercholesterolemia, unspecified: Secondary | ICD-10-CM | POA: Diagnosis not present

## 2020-02-03 DIAGNOSIS — G629 Polyneuropathy, unspecified: Secondary | ICD-10-CM | POA: Diagnosis not present

## 2020-02-03 DIAGNOSIS — N184 Chronic kidney disease, stage 4 (severe): Secondary | ICD-10-CM | POA: Diagnosis not present

## 2020-02-03 DIAGNOSIS — I2510 Atherosclerotic heart disease of native coronary artery without angina pectoris: Secondary | ICD-10-CM | POA: Diagnosis not present

## 2020-02-03 DIAGNOSIS — Z794 Long term (current) use of insulin: Secondary | ICD-10-CM | POA: Diagnosis not present

## 2020-02-03 DIAGNOSIS — E1151 Type 2 diabetes mellitus with diabetic peripheral angiopathy without gangrene: Secondary | ICD-10-CM | POA: Diagnosis not present

## 2020-02-03 DIAGNOSIS — Z7901 Long term (current) use of anticoagulants: Secondary | ICD-10-CM | POA: Diagnosis not present

## 2020-02-03 DIAGNOSIS — N401 Enlarged prostate with lower urinary tract symptoms: Secondary | ICD-10-CM | POA: Diagnosis not present

## 2020-02-03 DIAGNOSIS — E1129 Type 2 diabetes mellitus with other diabetic kidney complication: Secondary | ICD-10-CM | POA: Diagnosis not present

## 2020-03-30 ENCOUNTER — Other Ambulatory Visit: Payer: Self-pay

## 2020-03-30 ENCOUNTER — Encounter (INDEPENDENT_AMBULATORY_CARE_PROVIDER_SITE_OTHER): Payer: Medicare Other | Admitting: Ophthalmology

## 2020-03-30 DIAGNOSIS — H353132 Nonexudative age-related macular degeneration, bilateral, intermediate dry stage: Secondary | ICD-10-CM

## 2020-03-30 DIAGNOSIS — H43813 Vitreous degeneration, bilateral: Secondary | ICD-10-CM

## 2020-03-30 DIAGNOSIS — I10 Essential (primary) hypertension: Secondary | ICD-10-CM

## 2020-03-30 DIAGNOSIS — H35033 Hypertensive retinopathy, bilateral: Secondary | ICD-10-CM

## 2020-04-06 ENCOUNTER — Ambulatory Visit (INDEPENDENT_AMBULATORY_CARE_PROVIDER_SITE_OTHER): Payer: Medicare Other | Admitting: Podiatry

## 2020-04-06 ENCOUNTER — Encounter: Payer: Self-pay | Admitting: Podiatry

## 2020-04-06 ENCOUNTER — Other Ambulatory Visit: Payer: Self-pay

## 2020-04-06 DIAGNOSIS — L600 Ingrowing nail: Secondary | ICD-10-CM

## 2020-04-06 DIAGNOSIS — N183 Chronic kidney disease, stage 3 unspecified: Secondary | ICD-10-CM

## 2020-04-06 DIAGNOSIS — E119 Type 2 diabetes mellitus without complications: Secondary | ICD-10-CM

## 2020-04-06 DIAGNOSIS — M79675 Pain in left toe(s): Secondary | ICD-10-CM | POA: Diagnosis not present

## 2020-04-06 DIAGNOSIS — B351 Tinea unguium: Secondary | ICD-10-CM | POA: Diagnosis not present

## 2020-04-06 DIAGNOSIS — Z794 Long term (current) use of insulin: Secondary | ICD-10-CM

## 2020-04-06 DIAGNOSIS — E1122 Type 2 diabetes mellitus with diabetic chronic kidney disease: Secondary | ICD-10-CM

## 2020-04-06 DIAGNOSIS — M79674 Pain in right toe(s): Secondary | ICD-10-CM

## 2020-04-10 NOTE — Progress Notes (Signed)
ANNUAL DIABETIC FOOT EXAM  Subjective: Victor Wallace presents today for for annual diabetic foot examination and painful thick toenails that are difficult to trim. Pain interferes with ambulation. Aggravating factors include wearing enclosed shoe gear. Pain is relieved with periodic professional debridement..  Patient relates 18 year h/o diabetes.  Patient denies any h/o foot wounds.  He denies any numbness, tingling or burning of feet.  He is a former smoker.  Tisovec, Fransico Him, MD is patient's PCP. Last visit was 10/27/2019.  Past Medical History:  Diagnosis Date  . CAD (coronary artery disease), native coronary artery    Catheterization 2008 showing moderate nonobstructive disease in LAD, circumflex and right coronary artery, no lesions greater than 40%.  EF of 35% at that time.   Marland Kitchen GERD (gastroesophageal reflux disease)   . History of kidney stones   . Hypercholesterolemia   . Hyperlipidemia 12/02/2008  . Hypertension   . Kidney disease, chronic, stage III (GFR 30-59 ml/min) ([place]) 08/29/2014  . Paroxysmal atrial fibrillation ([facility]) 08/29/2014   Chads2VASC score 5   . Transient global amnesia 2008   . Type 2 diabetes mellitus with renal manifestations, controlled [facility] [place])     Patient Active Problem List   Diagnosis Date Noted  . Pain in joint of right shoulder 01/07/2020  . Labile blood pressure 08/24/2019  . DOE (dyspnea on exertion) 08/24/2019  . UTI (urinary tract infection) 04/29/2015  . SIRS (systemic inflammatory response syndrome) ([place]) 04/29/2015  . Bronchitis, chronic obstructive w acute bronchitis ([place]) 04/29/2015  . Arterial hypotension   . Hypotension 04/28/2015  . Chronic anticoagulation 09/21/2014  . Kidney disease, chronic, stage III (GFR 30-59 ml/min) ([facility]) 08/29/2014  . Paroxysmal atrial fibrillation ([place]) 08/29/2014  . Hypertension 08/29/2014  . Unstable angina pectoris ([place]) 08/29/2014  . History of kidney stones   . GERD (gastroesophageal reflux  disease) 01/31/2012  . Type 2 diabetes mellitus with renal manifestations, controlled ([place])   . Hyperlipidemia 12/02/2008  . Nonocclusive coronary atherosclerosis of native coronary artery     Past Surgical History:  Procedure Laterality Date  . CYSTOSCOPY W/ URETERAL STENT PLACEMENT Left 03/06/2013   Procedure: CYSTOSCOPY WITH RETROGRADE PYELOGRAM/URETERAL STENT PLACEMENT;  Surgeon: Malka So, MD;  Location: [facility];  Service: Urology;  Laterality: Left;  . JOINT REPLACEMENT    . TONSILLECTOMY    . TOTAL HIP ARTHROPLASTY Right     Current Outpatient Medications on File Prior to Visit  Medication Sig Dispense Refill  . albuterol (PROVENTIL HFA;VENTOLIN HFA) 108 (90 BASE) MCG/ACT inhaler Inhale 2 puffs into the lungs every 6 (six) hours as needed for wheezing or shortness of breath. 1 Inhaler 2  . aspirin EC 81 MG tablet Take 1 tablet (81 mg total) by mouth daily.    Marland Kitchen atorvastatin (LIPITOR) 40 MG tablet Take 40 mg by mouth daily.    . Calcium Carb-Cholecalciferol (CALCIUM 600+D3 PO) Take 1 tablet by mouth at bedtime.    . cycloSPORINE (RESTASIS) 0.05 % ophthalmic emulsion Place 2 drops 2 (two) times daily into both eyes.     Marland Kitchen docusate sodium (COLACE) 100 MG capsule Take 100 mg by mouth at bedtime.    Marland Kitchen ELIQUIS 2.5 MG TABS tablet TAKE 1 TABLET TWICE A DAY (NEED TO SCHEDULE MD VISIT FOR FURTHER REFILLS) (Patient taking differently: Take 2.5 mg by mouth 2 (two) times daily. ) 60 tablet 0  . fish oil-omega-3 fatty acids 1000 MG capsule Take 1 capsule (1 g total) by mouth daily. Stop taking  if having diarrhea (Patient taking differently: Take 1 g by mouth at bedtime. )    . hypromellose (GENTEAL SEVERE) 0.3 % GEL ophthalmic ointment Place 1 application into both eyes as needed for dry eyes.    . insulin aspart (NOVOLOG) 100 UNIT/ML injection Inject 0-10 Units into the skin See admin instructions. Inject 0-10 units into the skin 3 times a day with meals and at bedtime per sliding scale,  depending on carb intake: BGL <120 = give nothing; 121-150 = 1 unit; 151-200 = 2 units; 201-250 = 3 units; 251-300 = 4 units; 301-350 = 5 units; 351-400 = 6 units; >400 = 10 units    . insulin detemir (LEVEMIR) 100 UNIT/ML injection Inject 24 Units at bedtime into the skin.     Marland Kitchen isosorbide mononitrate (IMDUR) 30 MG 24 hr tablet Take 30 mg by mouth daily.    Marland Kitchen LEVEMIR FLEXTOUCH 100 UNIT/ML FlexPen Inject into the skin.    Marland Kitchen linagliptin (TRADJENTA) 5 MG TABS tablet Take 1 tablet (5 mg total) by mouth daily. 30 tablet 3  . metoprolol succinate (TOPROL-XL) 25 MG 24 hr tablet     . metoprolol tartrate (LOPRESSOR) 25 MG tablet Take 1 tablet (25 mg total) by mouth 2 (two) times daily. 60 tablet 0  . Multiple Vitamins-Minerals (MULTIVITAMIN WITH MINERALS) tablet Take 1 tablet by mouth daily.     . nitroGLYCERIN (NITROSTAT) 0.4 MG SL tablet Place 1 tablet (0.4 mg total) under the tongue every 5 (five) minutes x 3 doses as needed for chest pain. 25 tablet 3  . omeprazole (PRILOSEC) 20 MG capsule Take 20 mg every evening by mouth.     . pyridOXINE (VITAMIN B-6) 100 MG tablet Take 100 mg by mouth 2 (two) times daily.     . timolol (BETIMOL) 0.5 % ophthalmic solution Place 1 drop into the left eye 2 (two) times daily.     . timolol (TIMOPTIC) 0.5 % ophthalmic solution      No current facility-administered medications on file prior to visit.     Allergies  Allergen Reactions  . Shirley Friar [Lifitegrast] Other (See Comments)    Made the eyes burn    Social History   Occupational History  . Not on file  Tobacco Use  . Smoking status: Never Smoker  . Smokeless tobacco: Never Used  Substance and Sexual Activity  . Alcohol use: Yes    Alcohol/week: 1.0 standard drink    Types: 1 Cans of beer per week    Comment: occasionally  . Drug use: No  . Sexual activity: Never    Family History  Problem Relation Age of Onset  . Heart attack Father   . Emphysema Brother     Immunization History   Administered Date(s) Administered  . Influenza,inj,Quad PF,6+ Mos 04/29/2015  . Pneumococcal Polysaccharide-23 04/29/2015     Objective: There were no vitals filed for this visit.  Victor Wallace is a pleasant 86 y.o. male in NAD. AAO X 3.  Vascular Examination: Capillary refill time to digits <4 seconds b/l lower extremities. DP pulse diminished left foot. DP pulse diminished right foot. PT pulse diminished left foot. PT pulse diminished right foot. Pedal hair absent. Lower extremity skin temperature gradient warm to cool. No pain with calf compression b/l. No edema noted b/l lower extremities.  Dermatological Examination: Pedal skin with normal turgor, texture and tone bilaterally. No open wounds bilaterally. No interdigital macerations bilaterally. Toenails 1-5 b/l elongated, discolored, dystrophic, thickened, crumbly with subungual  debris and tenderness to dorsal palpation. Incurvated nailplate medial border(s) L hallux and R hallux.  Nail border hypertrophy absent. No pain on palpation and no signs of infection noted.  Musculoskeletal Examination: Normal muscle strength 5/5 to all lower extremity muscle groups bilaterally. No pain crepitus or joint limitation noted with ROM b/l. No gross bony deformities bilaterally.  Footwear Assessment: Does the patient wear appropriate shoes? Yes. Does the patient need inserts/orthotics? No.  Neurological Examination: Protective sensation intact 5/5 intact bilaterally with 10g monofilament b/l. Clonus negative b/l.  Assessment: 1. Pain due to onychomycosis of toenails of both feet   2. Ingrown toenail without infection   3. Controlled type 2 diabetes mellitus with stage 3 chronic kidney disease, with long-term current use of insulin ([place])   4. Encounter for diabetic foot exam ([place])     ADA Risk Categorization: High Risk  Patient has one or more of the following: Loss of protective sensation Absent pedal pulses Severe Foot  deformity History of foot ulcer  Plan: -Examined patient. -No new findings. No new orders. -Diabetic foot examination performed on today's visit. -Toenails 1-5 b/l were debrided in length and girth with sterile nail nippers and dremel without iatrogenic bleeding.  -Offending nail border debrided and curretaged L hallux and R hallux utilizing sterile nail nipper and currette. Border(s) cleansed with alcohol and triple antibiotic ointment applied. Patient instructed to apply Neosporin to L hallux and R hallux once daily for 7 days. -Patient to report any pedal injuries to medical professional immediately. -Patient to continue soft, supportive shoe gear daily. -Patient/POA to call should there be question/concern in the interim.  Return in about 3 months (around 07/07/2020).  Marzetta Board, DPM

## 2020-05-18 DIAGNOSIS — Z23 Encounter for immunization: Secondary | ICD-10-CM | POA: Diagnosis not present

## 2020-05-18 DIAGNOSIS — N184 Chronic kidney disease, stage 4 (severe): Secondary | ICD-10-CM | POA: Diagnosis not present

## 2020-05-18 DIAGNOSIS — G629 Polyneuropathy, unspecified: Secondary | ICD-10-CM | POA: Diagnosis not present

## 2020-05-18 DIAGNOSIS — Z7901 Long term (current) use of anticoagulants: Secondary | ICD-10-CM | POA: Diagnosis not present

## 2020-05-18 DIAGNOSIS — I480 Paroxysmal atrial fibrillation: Secondary | ICD-10-CM | POA: Diagnosis not present

## 2020-05-18 DIAGNOSIS — E1129 Type 2 diabetes mellitus with other diabetic kidney complication: Secondary | ICD-10-CM | POA: Diagnosis not present

## 2020-05-18 DIAGNOSIS — E1151 Type 2 diabetes mellitus with diabetic peripheral angiopathy without gangrene: Secondary | ICD-10-CM | POA: Diagnosis not present

## 2020-05-18 DIAGNOSIS — I2510 Atherosclerotic heart disease of native coronary artery without angina pectoris: Secondary | ICD-10-CM | POA: Diagnosis not present

## 2020-05-18 DIAGNOSIS — E7800 Pure hypercholesterolemia, unspecified: Secondary | ICD-10-CM | POA: Diagnosis not present

## 2020-05-18 DIAGNOSIS — Z794 Long term (current) use of insulin: Secondary | ICD-10-CM | POA: Diagnosis not present

## 2020-05-18 DIAGNOSIS — N401 Enlarged prostate with lower urinary tract symptoms: Secondary | ICD-10-CM | POA: Diagnosis not present

## 2020-07-05 ENCOUNTER — Telehealth: Payer: Self-pay | Admitting: Podiatry

## 2020-07-05 NOTE — Telephone Encounter (Signed)
They can try [facility]. They have Podiatry services.

## 2020-07-05 NOTE — Telephone Encounter (Signed)
Patient is unable to come out the house, very difficult;t to leave the house. Is there any organizations that would come out to the house. His wife vicki would like to know.

## 2020-07-06 NOTE — Telephone Encounter (Signed)
Thank you, spoke to wife to inform her

## 2020-07-11 ENCOUNTER — Ambulatory Visit: Payer: Medicare Other | Admitting: Podiatry

## 2020-08-03 DIAGNOSIS — E1129 Type 2 diabetes mellitus with other diabetic kidney complication: Secondary | ICD-10-CM | POA: Diagnosis not present

## 2020-08-03 DIAGNOSIS — E7800 Pure hypercholesterolemia, unspecified: Secondary | ICD-10-CM | POA: Diagnosis not present

## 2020-08-03 DIAGNOSIS — Z125 Encounter for screening for malignant neoplasm of prostate: Secondary | ICD-10-CM | POA: Diagnosis not present

## 2020-09-02 DIAGNOSIS — E1151 Type 2 diabetes mellitus with diabetic peripheral angiopathy without gangrene: Secondary | ICD-10-CM | POA: Diagnosis not present

## 2020-09-02 DIAGNOSIS — Z7901 Long term (current) use of anticoagulants: Secondary | ICD-10-CM | POA: Diagnosis not present

## 2020-09-02 DIAGNOSIS — E7800 Pure hypercholesterolemia, unspecified: Secondary | ICD-10-CM | POA: Diagnosis not present

## 2020-09-02 DIAGNOSIS — E1129 Type 2 diabetes mellitus with other diabetic kidney complication: Secondary | ICD-10-CM | POA: Diagnosis not present

## 2020-09-02 DIAGNOSIS — N401 Enlarged prostate with lower urinary tract symptoms: Secondary | ICD-10-CM | POA: Diagnosis not present

## 2020-09-02 DIAGNOSIS — Z794 Long term (current) use of insulin: Secondary | ICD-10-CM | POA: Diagnosis not present

## 2020-09-02 DIAGNOSIS — I2510 Atherosclerotic heart disease of native coronary artery without angina pectoris: Secondary | ICD-10-CM | POA: Diagnosis not present

## 2020-09-02 DIAGNOSIS — I480 Paroxysmal atrial fibrillation: Secondary | ICD-10-CM | POA: Diagnosis not present

## 2020-09-02 DIAGNOSIS — N35919 Unspecified urethral stricture, male, unspecified site: Secondary | ICD-10-CM | POA: Diagnosis not present

## 2020-09-02 DIAGNOSIS — N184 Chronic kidney disease, stage 4 (severe): Secondary | ICD-10-CM | POA: Diagnosis not present

## 2020-09-02 DIAGNOSIS — I209 Angina pectoris, unspecified: Secondary | ICD-10-CM | POA: Diagnosis not present

## 2020-09-02 DIAGNOSIS — Z0000 Encounter for general adult medical examination without abnormal findings: Secondary | ICD-10-CM | POA: Diagnosis not present

## 2020-09-19 ENCOUNTER — Ambulatory Visit (INDEPENDENT_AMBULATORY_CARE_PROVIDER_SITE_OTHER): Payer: Medicare Other | Admitting: Podiatry

## 2020-09-19 ENCOUNTER — Other Ambulatory Visit: Payer: Self-pay

## 2020-09-19 DIAGNOSIS — N183 Chronic kidney disease, stage 3 unspecified: Secondary | ICD-10-CM | POA: Diagnosis not present

## 2020-09-19 DIAGNOSIS — B351 Tinea unguium: Secondary | ICD-10-CM

## 2020-09-19 DIAGNOSIS — E1122 Type 2 diabetes mellitus with diabetic chronic kidney disease: Secondary | ICD-10-CM | POA: Diagnosis not present

## 2020-09-19 DIAGNOSIS — M79674 Pain in right toe(s): Secondary | ICD-10-CM | POA: Diagnosis not present

## 2020-09-19 DIAGNOSIS — Z794 Long term (current) use of insulin: Secondary | ICD-10-CM | POA: Diagnosis not present

## 2020-09-19 DIAGNOSIS — M79675 Pain in left toe(s): Secondary | ICD-10-CM | POA: Diagnosis not present

## 2020-09-24 ENCOUNTER — Encounter: Payer: Self-pay | Admitting: Podiatry

## 2020-09-24 NOTE — Progress Notes (Signed)
Subjective: Victor Wallace presents today for at risk foot care. Pt has h/o NIDDM with chronic kidney disease and painful thick toenails that are difficult to trim. Pain interferes with ambulation. Aggravating factors include wearing enclosed shoe gear. Pain is relieved with periodic professional debridement.   Patient's blood glucose was 130 mg/dl this morning; 90 mg/dl this afternoon.  Tisovec, Fransico Him, MD is patient's PCP. Last visit was 05/18/2020.  Allergies  Allergen Reactions  . Shirley Friar [Lifitegrast] Other (See Comments)    Made the eyes burn   Objective: There were no vitals filed for this visit.  Victor Wallace is a pleasant 87 y.o. male in NAD. AAO X 3.  Vascular Examination: Capillary refill time to digits <4 seconds b/l lower extremities. DP pulse diminished left foot. DP pulse diminished right foot. PT pulse diminished left foot. PT pulse diminished right foot. Pedal hair absent. Lower extremity skin temperature gradient warm to cool. No pain with calf compression b/l. No edema noted b/l lower extremities.  Dermatological Examination: Pedal skin with normal turgor, texture and tone bilaterally. No open wounds bilaterally. No interdigital macerations bilaterally. Toenails 1-5 b/l elongated, discolored, dystrophic, thickened, crumbly with subungual debris and tenderness to dorsal palpation. Incurvated nailplate medial border(s) L hallux and R hallux.  Nail border hypertrophy absent. No pain on palpation and no signs of infection noted.  Musculoskeletal Examination: Normal muscle strength 5/5 to all lower extremity muscle groups bilaterally. No pain crepitus or joint limitation noted with ROM b/l. No gross bony deformities bilaterally.  Neurological Examination: Protective sensation intact 5/5 intact bilaterally with 10g monofilament b/l. Clonus negative b/l.  Assessment: 1. Pain due to onychomycosis of toenails of both feet   2. Controlled type 2 diabetes mellitus  with stage 3 chronic kidney disease, with long-term current use of insulin ([place])     Plan: -Examined patient. -No new findings. No new orders. -Continue diabetic foot care principles. -Toenails 1-5 b/l were debrided in length and girth with sterile nail nippers and dremel without iatrogenic bleeding.  -Patient to report any pedal injuries to medical professional immediately. -Patient/POA to call should there be question/concern in the interim.  Return in about 3 months (around 12/20/2020).  Marzetta Board, DPM

## 2020-09-29 ENCOUNTER — Observation Stay (HOSPITAL_COMMUNITY): Payer: Medicare Other

## 2020-09-29 ENCOUNTER — Emergency Department (HOSPITAL_COMMUNITY): Payer: Medicare Other

## 2020-09-29 ENCOUNTER — Encounter (HOSPITAL_COMMUNITY): Payer: Self-pay | Admitting: Emergency Medicine

## 2020-09-29 ENCOUNTER — Inpatient Hospital Stay (HOSPITAL_COMMUNITY)
Admission: EM | Admit: 2020-09-29 | Discharge: 2020-10-04 | DRG: 052 | Disposition: A | Discharge status: Hospice | Payer: Medicare Other | Attending: Internal Medicine | Admitting: Internal Medicine

## 2020-09-29 DIAGNOSIS — Z20822 Contact with and (suspected) exposure to covid-19: Secondary | ICD-10-CM | POA: Diagnosis present

## 2020-09-29 DIAGNOSIS — M549 Dorsalgia, unspecified: Secondary | ICD-10-CM

## 2020-09-29 DIAGNOSIS — R52 Pain, unspecified: Secondary | ICD-10-CM | POA: Diagnosis not present

## 2020-09-29 DIAGNOSIS — Z66 Do not resuscitate: Secondary | ICD-10-CM | POA: Diagnosis present

## 2020-09-29 DIAGNOSIS — Z794 Long term (current) use of insulin: Secondary | ICD-10-CM

## 2020-09-29 DIAGNOSIS — S22052A Unstable burst fracture of T5-T6 vertebra, initial encounter for closed fracture: Secondary | ICD-10-CM | POA: Diagnosis not present

## 2020-09-29 DIAGNOSIS — S32121A Minimally displaced Zone II fracture of sacrum, initial encounter for closed fracture: Secondary | ICD-10-CM | POA: Diagnosis not present

## 2020-09-29 DIAGNOSIS — E1122 Type 2 diabetes mellitus with diabetic chronic kidney disease: Secondary | ICD-10-CM | POA: Diagnosis present

## 2020-09-29 DIAGNOSIS — M4804 Spinal stenosis, thoracic region: Secondary | ICD-10-CM | POA: Diagnosis not present

## 2020-09-29 DIAGNOSIS — Z87442 Personal history of urinary calculi: Secondary | ICD-10-CM

## 2020-09-29 DIAGNOSIS — Z7901 Long term (current) use of anticoagulants: Secondary | ICD-10-CM

## 2020-09-29 DIAGNOSIS — W19XXXA Unspecified fall, initial encounter: Secondary | ICD-10-CM | POA: Diagnosis not present

## 2020-09-29 DIAGNOSIS — W010XXA Fall on same level from slipping, tripping and stumbling without subsequent striking against object, initial encounter: Secondary | ICD-10-CM | POA: Diagnosis present

## 2020-09-29 DIAGNOSIS — N179 Acute kidney failure, unspecified: Secondary | ICD-10-CM | POA: Diagnosis present

## 2020-09-29 DIAGNOSIS — S22058A Other fracture of T5-T6 vertebra, initial encounter for closed fracture: Secondary | ICD-10-CM | POA: Diagnosis present

## 2020-09-29 DIAGNOSIS — I959 Hypotension, unspecified: Secondary | ICD-10-CM | POA: Diagnosis present

## 2020-09-29 DIAGNOSIS — Z043 Encounter for examination and observation following other accident: Secondary | ICD-10-CM | POA: Diagnosis not present

## 2020-09-29 DIAGNOSIS — Z515 Encounter for palliative care: Secondary | ICD-10-CM

## 2020-09-29 DIAGNOSIS — K219 Gastro-esophageal reflux disease without esophagitis: Secondary | ICD-10-CM | POA: Diagnosis present

## 2020-09-29 DIAGNOSIS — E1129 Type 2 diabetes mellitus with other diabetic kidney complication: Secondary | ICD-10-CM | POA: Diagnosis present

## 2020-09-29 DIAGNOSIS — Z8249 Family history of ischemic heart disease and other diseases of the circulatory system: Secondary | ICD-10-CM

## 2020-09-29 DIAGNOSIS — Z993 Dependence on wheelchair: Secondary | ICD-10-CM

## 2020-09-29 DIAGNOSIS — Z825 Family history of asthma and other chronic lower respiratory diseases: Secondary | ICD-10-CM

## 2020-09-29 DIAGNOSIS — E7800 Pure hypercholesterolemia, unspecified: Secondary | ICD-10-CM | POA: Diagnosis present

## 2020-09-29 DIAGNOSIS — M25551 Pain in right hip: Secondary | ICD-10-CM | POA: Diagnosis not present

## 2020-09-29 DIAGNOSIS — I129 Hypertensive chronic kidney disease with stage 1 through stage 4 chronic kidney disease, or unspecified chronic kidney disease: Secondary | ICD-10-CM | POA: Diagnosis present

## 2020-09-29 DIAGNOSIS — S22000A Wedge compression fracture of unspecified thoracic vertebra, initial encounter for closed fracture: Secondary | ICD-10-CM | POA: Diagnosis present

## 2020-09-29 DIAGNOSIS — I480 Paroxysmal atrial fibrillation: Secondary | ICD-10-CM | POA: Diagnosis present

## 2020-09-29 DIAGNOSIS — E785 Hyperlipidemia, unspecified: Secondary | ICD-10-CM | POA: Diagnosis present

## 2020-09-29 DIAGNOSIS — S24152A Other incomplete lesion at T2-T6 level of thoracic spinal cord, initial encounter: Principal | ICD-10-CM | POA: Diagnosis present

## 2020-09-29 DIAGNOSIS — S22049A Unspecified fracture of fourth thoracic vertebra, initial encounter for closed fracture: Secondary | ICD-10-CM

## 2020-09-29 DIAGNOSIS — I4510 Unspecified right bundle-branch block: Secondary | ICD-10-CM | POA: Diagnosis not present

## 2020-09-29 DIAGNOSIS — Y92012 Bathroom of single-family (private) house as the place of occurrence of the external cause: Secondary | ICD-10-CM

## 2020-09-29 DIAGNOSIS — M25559 Pain in unspecified hip: Secondary | ICD-10-CM | POA: Diagnosis not present

## 2020-09-29 DIAGNOSIS — E872 Acidosis: Secondary | ICD-10-CM | POA: Diagnosis present

## 2020-09-29 DIAGNOSIS — I2510 Atherosclerotic heart disease of native coronary artery without angina pectoris: Secondary | ICD-10-CM | POA: Diagnosis present

## 2020-09-29 DIAGNOSIS — R911 Solitary pulmonary nodule: Secondary | ICD-10-CM | POA: Diagnosis present

## 2020-09-29 DIAGNOSIS — N281 Cyst of kidney, acquired: Secondary | ICD-10-CM | POA: Diagnosis not present

## 2020-09-29 DIAGNOSIS — M545 Low back pain, unspecified: Secondary | ICD-10-CM | POA: Diagnosis not present

## 2020-09-29 DIAGNOSIS — S22050A Wedge compression fracture of T5-T6 vertebra, initial encounter for closed fracture: Secondary | ICD-10-CM

## 2020-09-29 DIAGNOSIS — N1832 Chronic kidney disease, stage 3b: Secondary | ICD-10-CM | POA: Diagnosis present

## 2020-09-29 DIAGNOSIS — Z96641 Presence of right artificial hip joint: Secondary | ICD-10-CM | POA: Diagnosis present

## 2020-09-29 DIAGNOSIS — I6203 Nontraumatic chronic subdural hemorrhage: Secondary | ICD-10-CM | POA: Diagnosis not present

## 2020-09-29 DIAGNOSIS — F0390 Unspecified dementia without behavioral disturbance: Secondary | ICD-10-CM | POA: Diagnosis present

## 2020-09-29 DIAGNOSIS — R9431 Abnormal electrocardiogram [ECG] [EKG]: Secondary | ICD-10-CM | POA: Diagnosis not present

## 2020-09-29 DIAGNOSIS — S3210XA Unspecified fracture of sacrum, initial encounter for closed fracture: Secondary | ICD-10-CM

## 2020-09-29 LAB — CBC WITH DIFFERENTIAL/PLATELET
Abs Immature Granulocytes: 0.16 10*3/uL — ABNORMAL HIGH (ref 0.00–0.07)
Basophils Absolute: 0.1 10*3/uL (ref 0.0–0.1)
Basophils Relative: 0 %
Eosinophils Absolute: 0.1 10*3/uL (ref 0.0–0.5)
Eosinophils Relative: 1 %
HCT: 42.5 % (ref 39.0–52.0)
Hemoglobin: 13.4 g/dL (ref 13.0–17.0)
Immature Granulocytes: 1 %
Lymphocytes Relative: 10 %
Lymphs Abs: 1.7 10*3/uL (ref 0.7–4.0)
MCH: 31.3 pg (ref 26.0–34.0)
MCHC: 31.5 g/dL (ref 30.0–36.0)
MCV: 99.3 fL (ref 80.0–100.0)
Monocytes Absolute: 0.8 10*3/uL (ref 0.1–1.0)
Monocytes Relative: 5 %
Neutro Abs: 13.6 10*3/uL — ABNORMAL HIGH (ref 1.7–7.7)
Neutrophils Relative %: 83 %
Platelets: 159 10*3/uL (ref 150–400)
RBC: 4.28 MIL/uL (ref 4.22–5.81)
RDW: 13.9 % (ref 11.5–15.5)
WBC: 16.4 10*3/uL — ABNORMAL HIGH (ref 4.0–10.5)
nRBC: 0 % (ref 0.0–0.2)

## 2020-09-29 LAB — BASIC METABOLIC PANEL
Anion gap: 7 (ref 5–15)
BUN: 28 mg/dL — ABNORMAL HIGH (ref 8–23)
CO2: 25 mmol/L (ref 22–32)
Calcium: 9.1 mg/dL (ref 8.9–10.3)
Chloride: 108 mmol/L (ref 98–111)
Creatinine, Ser: 1.53 mg/dL — ABNORMAL HIGH (ref 0.61–1.24)
GFR, Estimated: 42 mL/min — ABNORMAL LOW (ref >60)
Glucose, Bld: 165 mg/dL — ABNORMAL HIGH (ref 70–99)
Potassium: 4.1 mmol/L (ref 3.5–5.1)
Sodium: 140 mmol/L (ref 135–145)

## 2020-09-29 LAB — RESP PANEL BY RT-PCR (FLU A&B, COVID) ARPGX2
Influenza A by PCR: NEGATIVE
Influenza B by PCR: NEGATIVE
SARS Coronavirus 2 by RT PCR: NEGATIVE

## 2020-09-29 LAB — LACTIC ACID, PLASMA: Lactic Acid, Venous: 2.1 mmol/L (ref 0.5–1.9)

## 2020-09-29 MED ORDER — FENTANYL CITRATE (PF) 100 MCG/2ML IJ SOLN
50.0000 ug | Freq: Once | INTRAMUSCULAR | Status: DC
Start: 2020-09-29 — End: 2020-09-29

## 2020-09-29 MED ORDER — TIMOLOL MALEATE 0.5 % OP SOLN
1.0000 [drp] | Freq: Two times a day (BID) | OPHTHALMIC | Status: DC
  Administered 2020-09-30 – 2020-10-03 (×7): 1 [drp] via OPHTHALMIC
  Filled 2020-09-29 (×2): qty 5

## 2020-09-29 MED ORDER — MORPHINE SULFATE (PF) 4 MG/ML IV SOLN
4.0000 mg | Freq: Once | INTRAVENOUS | Status: DC
Start: 2020-09-29 — End: 2020-09-29

## 2020-09-29 MED ORDER — DOCUSATE SODIUM 100 MG PO CAPS
100.0000 mg | ORAL_CAPSULE | Freq: Every day | ORAL | Status: DC
  Administered 2020-09-30 – 2020-10-03 (×4): 100 mg via ORAL
  Filled 2020-09-29 (×4): qty 1

## 2020-09-29 MED ORDER — INSULIN ASPART 100 UNIT/ML ~~LOC~~ SOLN
0.0000 [IU] | Freq: Three times a day (TID) | SUBCUTANEOUS | Status: DC
  Filled 2020-09-29: qty 0.09

## 2020-09-29 MED ORDER — ATORVASTATIN CALCIUM 40 MG PO TABS
40.0000 mg | ORAL_TABLET | Freq: Every day | ORAL | Status: DC
  Administered 2020-09-30 – 2020-10-03 (×4): 40 mg via ORAL
  Filled 2020-09-29 (×4): qty 1

## 2020-09-29 MED ORDER — PROTHROMBIN COMPLEX CONC HUMAN 500 UNITS IV KIT: 50.0000 [IU]/kg | PACK | Status: DC

## 2020-09-29 MED ORDER — CYCLOSPORINE 0.05 % OP EMUL
2.0000 [drp] | Freq: Two times a day (BID) | OPHTHALMIC | Status: DC
  Administered 2020-09-30 – 2020-10-03 (×8): 2 [drp] via OPHTHALMIC
  Filled 2020-09-29 (×10): qty 1

## 2020-09-29 MED ORDER — INSULIN DETEMIR 100 UNIT/ML ~~LOC~~ SOLN
14.0000 [IU] | Freq: Every day | SUBCUTANEOUS | Status: DC
  Administered 2020-09-29 – 2020-10-02 (×4): 14 [IU] via SUBCUTANEOUS
  Filled 2020-09-29 (×6): qty 0.14

## 2020-09-29 MED ORDER — SODIUM CHLORIDE 0.9 % IV BOLUS
500.0000 mL | Freq: Once | INTRAVENOUS | Status: AC
  Administered 2020-09-29: 500 mL via INTRAVENOUS

## 2020-09-29 MED ORDER — MORPHINE SULFATE (PF) 4 MG/ML IV SOLN
4.0000 mg | Freq: Once | INTRAVENOUS | Status: AC
  Administered 2020-09-29: 4 mg via INTRAVENOUS
  Filled 2020-09-29: qty 1

## 2020-09-29 MED ORDER — ACETAMINOPHEN 650 MG RE SUPP: 650.0000 mg | Freq: Four times a day (QID) | RECTAL | Status: DC | PRN

## 2020-09-29 MED ORDER — INSULIN ASPART 100 UNIT/ML ~~LOC~~ SOLN
0.0000 [IU] | Freq: Every day | SUBCUTANEOUS | Status: DC
  Administered 2020-09-30: 5 [IU] via SUBCUTANEOUS
  Filled 2020-09-29: qty 0.05

## 2020-09-29 MED ORDER — SODIUM CHLORIDE 0.9 % IV SOLN: INTRAVENOUS | Status: AC

## 2020-09-29 MED ORDER — PANTOPRAZOLE SODIUM 40 MG PO TBEC
40.0000 mg | DELAYED_RELEASE_TABLET | Freq: Every day | ORAL | Status: DC
  Administered 2020-09-30 – 2020-10-03 (×4): 40 mg via ORAL
  Filled 2020-09-29 (×5): qty 1

## 2020-09-29 MED ORDER — FENTANYL CITRATE (PF) 100 MCG/2ML IJ SOLN
12.5000 ug | INTRAMUSCULAR | Status: DC | PRN
  Administered 2020-09-30: 12.5 ug via INTRAVENOUS
  Filled 2020-09-29: qty 2

## 2020-09-29 MED ORDER — SODIUM CHLORIDE 0.9 % IV BOLUS
1000.0000 mL | Freq: Once | INTRAVENOUS | Status: AC
  Administered 2020-09-29: 1000 mL via INTRAVENOUS

## 2020-09-29 MED ORDER — ACETAMINOPHEN 325 MG PO TABS
650.0000 mg | ORAL_TABLET | Freq: Four times a day (QID) | ORAL | Status: DC | PRN
  Administered 2020-09-30: 650 mg via ORAL
  Filled 2020-09-29: qty 2

## 2020-09-29 NOTE — H&P (Signed)
History and Physical    Victor Wallace JJK:[id number] DOB: [birth year] DOA: 09/29/2020  PCP: Haywood Pao, MD Patient coming from: Home  Chief Complaint: Fall  HPI: Victor Wallace is a 87 y.o. male with medical history significant of CAD, hypertension, hyperlipidemia, GERD, CKD stage III, paroxysmal A. fib on Eliquis, insulin-dependent type 2 diabetes presented to the ED via EMS complaining of low back pain and right hip pain after he had a fall at home.  In the ED, patient was hypotensive, however, afebrile and not tachycardic.  Not hypoxic.  Labs showing WBC 16.4, hemoglobin 13.4, hematocrit 42.5, platelet count 159K.  Sodium 140, potassium 4.1, chloride 108, bicarb 25, BUN 28, creatinine 1.5 (stable), glucose 165.  Initial lactic acid 2.1, repeat pending.  UA and urine culture pending.  Blood culture x2 pending.  SARS-CoV-2 PCR test pending.  Influenza panel pending.  Chest x-ray showing no active disease.  X-ray of right hip/pelvis negative for acute displaced pelvic fracture.  Head CT showing findings consistent with subdural hygromas or chronic subdural hematoma; no mass-effect; no acute intracranial hemorrhage.  CT C-spine negative for acute fracture.  CT thoracic spine showing acute T6 compression fracture with mild bony canal narrowing at T6 related to bony retropulsion.  Also showing incidental finding of left upper lobe pulmonary nodule measuring 4 mm.  CT renal stone study showing a 2.7 cm indeterminate density mass within the mid polar medial region of the right kidney, not significantly changed since 2016.  Showing no evidence of obstructive uropathy; a 3 mm nonobstructing right lower pole renal calculus.  Also showing a 5.3 cm rim calcified exophytic left renal cyst, stable since 2016.  In addition, showing a 1.6 cm circumscribed hypoattenuated lesion in the anterior aspect of the spleen, nonspecific.  Showing mild dependent airspace consolidation versus atelectasis, right greater  than left.  CT of lumbar spine showing an acute minimally displaced right sacral fracture; no evidence of acute lumbar fracture.  Showing a chronic mild L1 compression fracture, unchanged from 2016. Patient was given morphine and 1.5 L normal saline boluses. Orthopedics (Dr. Erlinda Hong) consulted and recommended nonoperative management - WBAT, PT, follow-up at orthopedics office in 1 month.  Neurosurgery (Dr. Reatha Armour) consulted and recommended TLSO brace.  History provided mostly by wife at bedside who states patient went to use the bathroom with his walker and while leaving the bathroom somehow lost his balance and fell.  She was not in the room at that time and could not weakness the fall but she heard a loud thump and when she came to check on him he was on the floor and the walker was on top of him.  He was conscious at that time.  Patient thinks he was dizzy at the time of his fall.  Wife states patient has not complained of any fevers, cough, shortness of breath, nausea, vomiting, or abdominal pain.  Per wife, patient has never been officially diagnosed with dementia but has had progressive decline in his memory since December 2020.  He is able to recognize family members but otherwise fully dependent on her for ADLs.  She does mention that his blood pressure tends to "fluctuate."  Review of Systems:  All systems reviewed and apart from history of presenting illness, are negative.  Past Medical History:  Diagnosis Date  . CAD (coronary artery disease), native coronary artery    Catheterization 2008 showing moderate nonobstructive disease in LAD, circumflex and right coronary artery, no lesions greater than 40%.  EF of 35% at that time.   Marland Kitchen GERD (gastroesophageal reflux disease)   . History of kidney stones   . Hypercholesterolemia   . Hyperlipidemia 12/02/2008  . Hypertension   . Kidney disease, chronic, stage III (GFR 30-59 ml/min) ([place]) 08/29/2014  . Paroxysmal atrial fibrillation (HCC) 08/29/2014    Chads2VASC score 5   . Transient global amnesia 2008   . Type 2 diabetes mellitus with renal manifestations, controlled [facility])     Past Surgical History:  Procedure Laterality Date  . CYSTOSCOPY W/ URETERAL STENT PLACEMENT Left 03/06/2013   Procedure: CYSTOSCOPY WITH RETROGRADE PYELOGRAM/URETERAL STENT PLACEMENT;  Surgeon: Malka So, MD;  Location: [facility]S;  Service: Urology;  Laterality: Left;  . JOINT REPLACEMENT    . TONSILLECTOMY    . TOTAL HIP ARTHROPLASTY Right      reports that he has never smoked. He has never used smokeless tobacco. He reports current alcohol use of about 1.0 standard drink of alcohol per week. He reports that he does not use drugs.  Allergies  Allergen Reactions  . Shirley Friar [Lifitegrast] Other (See Comments)    Made the eyes burn    Family History  Problem Relation Age of Onset  . Heart attack Father   . Emphysema Brother     Prior to Admission medications   Medication Sig Start Date End Date Taking? Authorizing Provider  Accu-Chek FastClix Lancets MISC Use to self-monitor blood glucose four times daily (dx 250.02) 07/21/13   [provider]  albuterol (PROVENTIL HFA;VENTOLIN HFA) 108 (90 BASE) MCG/ACT inhaler Inhale 2 puffs into the lungs every 6 (six) hours as needed for wheezing or shortness of breath. 04/30/15   Reyne Dumas, MD  apixaban (ELIQUIS) 2.5 MG TABS tablet Take 1 tablet by mouth 2 (two) times daily. 08/11/18   [provider]  aspirin EC 81 MG tablet Take 1 tablet (81 mg total) by mouth daily. 08/31/14   Lyda Jester M, PA-C  atorvastatin (LIPITOR) 40 MG tablet Take 1 tablet by mouth daily. 02/21/17   [provider]  Calcium Carb-Cholecalciferol (CALCIUM 600+D) 600-800 MG-UNIT TABS Take 1 tablet by mouth daily. 07/08/18   [provider]  Continuous Blood Gluc Sensor (FREESTYLE LIBRE 14 DAY SENSOR) MISC Apply q14 days, testing >4x/day, multiple daily injections   E11.51 07/08/18   [provider]   Cyanocobalamin (B-12) 1000 MCG TABS Take 1 tablet by mouth daily. 07/08/18   [provider]  cycloSPORINE (RESTASIS) 0.05 % ophthalmic emulsion Place 2 drops 2 (two) times daily into both eyes.     [provider]  Dextran 70-Hypromellose (GENTEAL TEARS) 0.1-0.3 % SOLN as needed. 07/08/18   [provider]  Docusate Sodium (STOOL SOFTENER) 100 MG capsule Take $RemoveBefo'100mg'IPcWjryTmMn$  by mouth daily 07/08/18   [provider]  fish oil-omega-3 fatty acids 1000 MG capsule Take 1 capsule (1 g total) by mouth daily. Stop taking if having diarrhea Patient taking differently: Take 1 g by mouth at bedtime.  02/05/12   Rai, Ripudeep K, MD  glucose blood (FREESTYLE LITE) test strip Dx 250.4 Pt checking sugars three times daily 10/01/12   [provider]  glucose blood test strip FreeStyle Lite Strips    [provider]  hypromellose (GENTEAL SEVERE) 0.3 % GEL ophthalmic ointment Place 1 application into both eyes as needed for dry eyes.    [provider]  insulin aspart (NOVOLOG FLEXPEN) 100 UNIT/ML FlexPen  01/11/19   [provider]  insulin detemir (LEVEMIR)  100 UNIT/ML injection Inject 24 Units at bedtime into the skin.     [provider]  Insulin Pen Needle (B-D UF III MINI PEN NEEDLES) 31G X 5 MM MISC USE UP TO 10 NEEDLES DAILY WITH BASAL AND BOLUS INSULIN Dx: E11.29 07/24/17   [provider]  Insulin Pen Needle 31G X 5 MM MISC BD Ultra-Fine Mini Pen Needle 31 gauge x 3/16"    [provider]  isosorbide mononitrate (IMDUR) 30 MG 24 hr tablet Take 1 tablet by mouth every morning. 08/11/18   [provider]  LEVEMIR FLEXTOUCH 100 UNIT/ML FlexPen Inject into the skin. 02/03/20   [provider]  linagliptin (TRADJENTA) 5 MG TABS tablet Take 1 tablet by mouth daily.    [provider]  metoprolol succinate (TOPROL-XL) 25 MG 24 hr tablet 1 tablet    [provider]  metoprolol tartrate (LOPRESSOR) 25  MG tablet Take 1 tablet (25 mg total) by mouth 2 (two) times daily. 07/15/15   Robbie Lis M, PA-C  Multiple Vitamins-Minerals (CENTRUM ADULTS PO) 1 tablet once daily 03/22/09   [provider]  Multiple Vitamins-Minerals (MULTIVITAMIN WITH MINERALS) tablet Take 1 tablet by mouth daily.     [provider]  nitroGLYCERIN (NITROSTAT) 0.4 MG SL tablet Place 1 tablet (0.4 mg total) under the tongue every 5 (five) minutes x 3 doses as needed for chest pain. 08/24/19   Jodelle Red, MD  omeprazole (PRILOSEC OTC) 20 MG tablet 1 tablet once daily 03/22/09   [provider]  omeprazole (PRILOSEC) 20 MG capsule Take 20 mg every evening by mouth.     [provider]  Ethelda Chick (OYSTER CALCIUM) 500 MG TABS tablet Take 1 tablet by mouth 2 (two) times daily. 03/22/09   [provider]  Phenazopyridine HCl & UTI Test (URISTAT UTI RELIEF PAK) 95 MG THPK 2 tablets qd PRN 07/08/18   [provider]  pregabalin (LYRICA) 50 MG capsule 1 tablet po qhs 06/21/16   [provider]  pyridOXINE (VITAMIN B-6) 100 MG tablet Take 100 mg by mouth 2 (two) times daily.     [provider]  timolol (BETIMOL) 0.5 % ophthalmic solution Place 1 drop into the left eye 2 (two) times daily.     [provider]  timolol (TIMOPTIC) 0.5 % ophthalmic solution  11/18/19   [provider]  triamcinolone (KENALOG) 0.1 % Apply to affected areas BID 11/16/16   [provider]    Physical Exam: Vitals:   09/29/20 1830 09/29/20 1845 09/29/20 1900 09/29/20 1915  BP: 140/80 138/75 139/77 (!) 141/77  Pulse: 78 80 84 84  Resp: 18 19 (!) 25 17  Temp:      TempSrc:      SpO2: 100% 100% 96% 100%  Weight:      Height:        Physical Exam Constitutional:      General: He is not in acute distress. HENT:     Head: Normocephalic and atraumatic.     Mouth/Throat:     Mouth: Mucous membranes are dry.  Eyes:     Extraocular Movements:  Extraocular movements intact.     Conjunctiva/sclera: Conjunctivae normal.  Cardiovascular:     Rate and Rhythm: Normal rate and regular rhythm.     Pulses: Normal pulses.  Pulmonary:     Effort: Pulmonary effort is normal. No respiratory distress.     Breath sounds: No wheezing or rales.  Abdominal:  General: Bowel sounds are normal.     Palpations: Abdomen is soft.     Tenderness: There is no abdominal tenderness.  Musculoskeletal:        General: No swelling or tenderness.     Cervical back: Normal range of motion and neck supple.  Skin:    General: Skin is warm and dry.  Neurological:     General: No focal deficit present.     Mental Status: He is alert.     Cranial Nerves: No cranial nerve deficit.     Sensory: No sensory deficit.     Motor: No weakness.     Labs on Admission: I have personally reviewed following labs and imaging studies  CBC: Recent Labs  Lab 09/29/20 1417  WBC 16.4*  NEUTROABS 13.6*  HGB 13.4  HCT 42.5  MCV 99.3  PLT 161   Basic Metabolic Panel: Recent Labs  Lab 09/29/20 1417  NA 140  K 4.1  CL 108  CO2 25  GLUCOSE 165*  BUN 28*  CREATININE 1.53*  CALCIUM 9.1   GFR: Estimated Creatinine Clearance: 32.4 mL/min (A) (by C-G formula based on SCr of 1.53 mg/dL (H)). Liver Function Tests: No results for input(s): AST, ALT, ALKPHOS, BILITOT, PROT, ALBUMIN in the last 168 hours. No results for input(s): LIPASE, AMYLASE in the last 168 hours. No results for input(s): AMMONIA in the last 168 hours. Coagulation Profile: No results for input(s): INR, PROTIME in the last 168 hours. Cardiac Enzymes: No results for input(s): CKTOTAL, CKMB, CKMBINDEX, TROPONINI in the last 168 hours. BNP (last 3 results) No results for input(s): PROBNP in the last 8760 hours. HbA1C: No results for input(s): HGBA1C in the last 72 hours. CBG: No results for input(s): GLUCAP in the last 168 hours. Lipid Profile: No results for input(s): CHOL, HDL, LDLCALC,  TRIG, CHOLHDL, LDLDIRECT in the last 72 hours. Thyroid Function Tests: No results for input(s): TSH, T4TOTAL, FREET4, T3FREE, THYROIDAB in the last 72 hours. Anemia Panel: No results for input(s): VITAMINB12, FOLATE, FERRITIN, TIBC, IRON, RETICCTPCT in the last 72 hours. Urine analysis:    Component Value Date/Time   COLORURINE YELLOW 08/10/2019 2250   APPEARANCEUR CLOUDY (A) 08/10/2019 2250   LABSPEC 1.015 08/10/2019 2250   PHURINE 5.0 08/10/2019 2250   GLUCOSEU >=500 (A) 08/10/2019 2250   HGBUR MODERATE (A) 08/10/2019 2250   BILIRUBINUR NEGATIVE 08/10/2019 2250   KETONESUR 5 (A) 08/10/2019 2250   PROTEINUR 30 (A) 08/10/2019 2250   UROBILINOGEN 0.2 04/29/2015 0504   NITRITE NEGATIVE 08/10/2019 2250   LEUKOCYTESUR LARGE (A) 08/10/2019 2250    Radiological Exams on Admission: DG Chest 1 View  Result Date: 09/29/2020 CLINICAL DATA:  Loss of balance, fall, hip pain EXAM: CHEST  1 VIEW COMPARISON:  08/10/2019 FINDINGS: The heart size and mediastinal contours are within normal limits. Both lungs are clear. The visualized skeletal structures are unremarkable. IMPRESSION: No active disease. Electronically Signed   By: Randa Ngo M.D.   On: 09/29/2020 15:20   CT Head Wo Contrast  Result Date: 09/29/2020 CLINICAL DATA:  Loss of balance, fell, hip pain EXAM: CT HEAD WITHOUT CONTRAST TECHNIQUE: Contiguous axial images were obtained from the base of the skull through the vertex without intravenous contrast. COMPARISON:  01/31/2012 FINDINGS: Brain: Hypodensities within the periventricular white matter are most consistent with chronic small vessel ischemic changes. No acute infarct or hemorrhage. Lateral ventricles and remaining midline structures are unremarkable. There are no acute extra-axial fluid collections. CSF attenuation fluid along  the cerebral convexities measure up to 0.7 cm on the right and 0.9 cm on the left, consistent with subdural hygromas or chronic subdural hematomas. These are  new since prior study. No mass effect. Vascular: No hyperdense vessel or unexpected calcification. Skull: Normal. Negative for fracture or focal lesion. Sinuses/Orbits: Mild polypoid mucosal thickening within the bilateral maxillary sinuses unchanged. Remaining sinuses are clear. Other: None. IMPRESSION: 1. Extra-axial CSF attenuation collections along the bilateral convexities, consistent with subdural hygromas or chronic subdural hematoma. No mass effect. 2. No acute intracranial hemorrhage. 3. Chronic small vessel ischemic changes within the white matter. No acute infarct. Electronically Signed   By: Randa Ngo M.D.   On: 09/29/2020 18:38   CT Cervical Spine Wo Contrast  Result Date: 09/29/2020 CLINICAL DATA:  [place], loss of balance EXAM: CT CERVICAL SPINE WITHOUT CONTRAST TECHNIQUE: Multidetector CT imaging of the cervical spine was performed without intravenous contrast. Multiplanar CT image reconstructions were also generated. COMPARISON:  None. FINDINGS: Alignment: Right convex scoliosis centered at C5. Otherwise alignment is anatomic. Skull base and vertebrae: No acute fracture. No primary bone lesion or focal pathologic process. Soft tissues and spinal canal: No prevertebral fluid or swelling. No visible canal hematoma. 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 Prominent spondylosis at C3-4, C5-6, and C6-7. Mild diffuse bilateral facet hypertrophy. Neural foraminal narrowing most pronounced at C3-4 and C5-6. Upper chest: Airway is patent.  Lung apices are clear. Other: Reconstructed images demonstrate no additional findings. IMPRESSION: 1. No acute cervical spine fracture. 2. Extensive multilevel cervical spondylosis and facet hypertrophy. Electronically Signed   By: Randa Ngo M.D.   On: 09/29/2020 18:40   CT Thoracic Spine Wo Contrast  Result Date: 09/29/2020 CLINICAL DATA:  Ground level fall at home using walker in the bathroom. Lost balance  leading to fall. Back pain. EXAM: CT THORACIC SPINE WITHOUT CONTRAST TECHNIQUE: Multidetector CT images of the thoracic were obtained using the standard protocol without intravenous contrast. COMPARISON:  No prior thoracic spine imaging. Chest radiograph 05/16/2017 reviewed FINDINGS: Alignment: Exaggerated upper thoracic kyphosis. Vertebrae: Acute T6 compression fracture. 40% loss of height anteriorly. There is posterior cortex involvement with cortical buckling of bony retropulsion of 2 mm. There is a nondisplaced fracture component that involves the spinous process of T6. No convincing pedicle or laminar involvement. No transverse process involvement. No other acute fracture. Occasional Schmorl's nodes as well as multilevel anterior spurring. Paraspinal and other soft tissues: 4 mm left upper lobe pulmonary nodule, series 3, image 40. Hypoventilatory changes in the dependent lungs. Calcified right hilar lymph nodes consistent with prior granulomatous disease. No acute fracture of included ribs. Disc levels: There is mild bony canal narrowing at T6 related to mild bony retropulsion. No other canal narrowing. Minor disc space narrowing in the lower thoracic spine with anterior spurring. IMPRESSION: 1. Acute T6 compression fracture with 40% loss of height anteriorly and 2 mm bony retropulsion. Mild bony canal narrowing at T6 related to bony retropulsion. Nondisplaced fracture extends through the spinous process of T6. 2. No other acute fracture of the thoracic spine. 3. Incidental note of a left upper lobe pulmonary nodule measuring 33mm. No prior exams available for comparison. Follow-up recommendation for a nodule of this size as follows, giving consideration to patient's advanced age: No follow-up needed if patient is low-risk. Non-contrast chest CT can be considered in 12 months if patient is high-risk. This recommendation follows the consensus statement: Guidelines for Management of Incidental  Pulmonary Nodules  Detected on CT Images: From the Fleischner Society 2017; Radiology 2017; [phone]. Electronically Signed   By: Keith Rake M.D.   On: 09/29/2020 18:43   CT L-SPINE NO CHARGE  Result Date: 09/29/2020 CLINICAL DATA:  Ground level fall at home all using walker in the bathroom. Lost balance leading to fall. Low back pain. EXAM: CT LUMBAR SPINE WITHOUT CONTRAST TECHNIQUE: Multidetector CT imaging of the lumbar spine was performed without intravenous contrast administration. Multiplanar CT image reconstructions were also generated. COMPARISON:  Reformats from abdominopelvic CT 02/21/2015 FINDINGS: Segmentation: 5 lumbar type vertebrae. Alignment: Normal. Vertebrae: Mild L1 superior endplate compression fracture is chronic. There is a prominent Schmorl's node involving L4 that is chronic. Undulation of inferior L5 endplate is chronic. There is no evidence of acute lumbar fracture. There is an acute fractures through right aspect of the sacrum extending through the sacral foramen, zone 2 fracture. Paraspinal and other soft tissues: Assistant concurrent abdominopelvic CT, reported separately. Disc levels: Minor L5-S1 and L1-L2 disc space narrowing. There is facet hypertrophy at L3-L4 and L5-S1. IMPRESSION: 1. Acute minimally displaced 7 2 right sacral fracture extending through the sacral foramen. 2. No evidence of acute lumbar fracture. 3. Chronic mild L1 compression fracture, unchanged from 2016. Electronically Signed   By: Keith Rake M.D.   On: 09/29/2020 18:49   CT Renal Stone Study  Result Date: 09/29/2020 CLINICAL DATA:  Flank pain.  Kidney stone suspected. EXAM: CT ABDOMEN AND PELVIS WITHOUT CONTRAST TECHNIQUE: Multidetector CT imaging of the abdomen and pelvis was performed following the standard protocol without IV contrast. COMPARISON:  February 21, 2015 FINDINGS: Lower chest: Mild dependent airspace consolidation versus atelectasis, right greater than left. Hepatobiliary: No focal liver  abnormality is seen. No gallstones, gallbladder wall thickening, or biliary dilatation. Pancreas: Unremarkable. No pancreatic ductal dilatation or surrounding inflammatory changes. Spleen: Scattered splenic granulomata 1.6 cm circumscribed hypoattenuated lesion in the anterior aspect of the spleen. Adrenals/Urinary Tract: Normal adrenal glands. No evidence of hydronephrosis or obstructive uropathy. 3 mm nonobstructive calculus in the lower pole of the right kidney. 2.7 x 2.1 cm solid mass within the mid polar medial region of the right kidney. 5.3 cm rim calcified exophytic left renal cyst off of the mid polar region of the kidney. Stomach/Bowel: Stomach is within normal limits. No evidence of bowel wall thickening, distention, or inflammatory changes. Vascular/Lymphatic: Aortic atherosclerosis. No enlarged abdominal or pelvic lymph nodes. Reproductive: Nonspecific coarse calcifications of the prostate gland. Other: No abdominal wall hernia or abnormality. No abdominopelvic ascites. Musculoskeletal: Right total hip arthroplasty. No evidence of acute fractures. IMPRESSION: 1. 2.7 cm indeterminate density mass within the mid polar medial region of the right kidney, not significantly changed from 2016 2. No evidence of obstructive uropathy. 3 mm nonobstructive right lower pole renal calculus. 3. 5.3 cm rim calcified exophytic left renal cyst, stable from 2016. 4. 1.6 cm circumscribed hypoattenuated lesion in the anterior aspect of the spleen, nonspecific. 5. Mild dependent airspace consolidation versus atelectasis, right greater than left. Aortic Atherosclerosis (ICD10-I70.0). Electronically Signed   By: Fidela Salisbury M.D.   On: 09/29/2020 18:49   DG Hip Unilat With Pelvis 2-3 Views Right  Result Date: 09/29/2020 CLINICAL DATA:  [place], right hip pain EXAM: DG HIP (WITH OR WITHOUT PELVIS) 2-3V RIGHT COMPARISON:  None. FINDINGS: Frontal view of the pelvis as well as frontal and cross-table lateral views of the  right hip are obtained. The distal margin of the femoral component of the right hip  arthroplasty is excluded by collimation on the lateral view. Bones are diffusely osteopenic. There is a right hip hemiarthroplasty, with long stem femoral component and cerclage wires. Alignment is anatomic. There are no acute displaced fractures. Heterotopic ossification surrounds the right hip arthroplasty. Mild left hip osteoarthritis. Sacroiliac joints are normal. IMPRESSION: 1. No acute displaced pelvic fracture. 2. Unremarkable right hip arthroplasty. Portions of the distal margin of the femoral component of the arthroplasty are excluded by collimation on the lateral view. Electronically Signed   By: Randa Ngo M.D.   On: 09/29/2020 15:19    EKG: Pending at this time.  Assessment/Plan Principal Problem:   Thoracic compression fracture (HCC) Active Problems:   Hyperlipidemia   Type 2 diabetes mellitus with renal manifestations, controlled (HCC)   Hypotension   Sacral fracture (HCC)   Acute T6 compression fracture: Secondary to a fall.  CT thoracic spine showing acute T6 compression fracture with mild bony canal narrowing at T6 related to bony retropulsion. Neurosurgery (Dr. Reatha Armour) consulted by ED physician and recommended TLSO brace. -TLSO brace, PT/OT, fall precautions, pain management Update: Dr. Reatha Armour called back ED physician and requested MRI of T-spine to determine if surgical management can be pursued.  Requested admission at [facility].  Will keep n.p.o. after midnight.  Acute minimally displaced right sacral fracture: Secondary to a fall.  Seen on CT. Orthopedics (Dr. Erlinda Hong) consulted and recommended nonoperative management - WBAT, PT, follow-up at orthopedics office in 1 month. -WBAT, PT/OT, fall precautions, outpatient orthopedics follow-up  Hypotension, mild lactic acidosis, ?sepsis: Patient was initially hypotensive in the ED, blood pressure now improved after 1.5 L fluid boluses.  No fever or  tachycardia.  WBC count 16.4.  Lactic acid mildly elevated at 2.1.  Chest x-ray not suggestive of pneumonia.  CT renal stone study showing mild dependent airspace consolidation versus atelectasis, right greater than left.  Pneumonia less likely as patient is not endorsing any respiratory symptoms and is not hypoxic. -Continue IV fluid hydration and repeat lactate.  Start antibiotics if procalcitonin is elevated.  Repeat labs to check white blood cell count.  UA and urine culture pending.  Blood culture x2 pending.  SARS-CoV-2 PCR test pending.  Influenza panel pending.  Hypertension -Hold home antihypertensives given hypotension.  Hyperlipidemia -Continue statin  CKD stage III: Stable.  Creatinine at baseline. -Continue to monitor  Paroxysmal A. Fib -Hold Xarelto at this time, pending MRI and neurosurgery evaluation.  Hold metoprolol given hypotension.  Insulin-dependent type 2 diabetes -Check A1c.  Continue home basal insulin.  Sliding scale insulin sensitive ACHS.  Pulmonary nodule: CT showing incidental finding of left upper lobe pulmonary nodule measuring 4 mm.  No documented history of tobacco use. -Given patient's advanced age, radiologist recommending no follow-up if patient is low risk versus noncontrast chest CT in 12 months if patient is high risk.  DVT prophylaxis: SCDs at this time Code Status: DNR-discussed with the patient's wife. Family Communication: Wife at bedside. Disposition Plan: Status is: Observation  The patient remains OBS appropriate and will d/c before 2 midnights.  Dispo: The patient is from: Home              Anticipated d/c is to: SNF              Patient currently is not medically stable to d/c.   Difficult to place patient No  Level of care: Level of care: Telemetry Medical   The medical decision making on this patient was of high complexity and  the patient is at high risk for clinical deterioration, therefore this is a level 3 visit.  Shela Leff MD [facility]  If 7PM-7AM, please contact night-coverage [email or web address]  09/29/2020, 9:34 PM

## 2020-09-29 NOTE — ED Notes (Signed)
Pt to xray

## 2020-09-29 NOTE — Progress Notes (Signed)
Called by emergency department physician due to T6 compression fracture and bilateral hygromas.  In review of the images this is likely a Chance fracture given extension into the spinous process and bilateral lamina.  I instructed the ER to transfer the patient to [facility], logroll strictly, bedrest and order a TLSO brace.  We will also obtain an MRI.  I will evaluate the patient once he arrives at [facility].   I discussed this directly with the emergency department physician.   Elwin Sleight, DO Neurosurgeon

## 2020-09-29 NOTE — Progress Notes (Signed)
[facility] night shift.  I have informed Dr. Reatha Armour of the patient's arrival and current location [facility].  Tennis Must, MD.

## 2020-09-29 NOTE — ED Provider Notes (Addendum)
[facility]-EMERGENCY DEPT Provider Note   CSN: [id number] Arrival date & time: 09/29/20  1319     History No chief complaint on file.   Victor Wallace is a 87 y.o. male.  He has a history of paroxysmal A. fib and is on Eliquis.  He had a fall at home.  Complaining of low back pain.  Pain is severe in nature worse with bending twisting.  EMS established an IV and gave him some fentanyl.  He denies hitting his head, neck pain, loss of consciousness.  Patient cannot recall the fall or situation why he fell.  The history is provided by the patient and the EMS personnel.  Fall This is a new problem. The current episode started 1 to 2 hours ago. The problem has not changed since onset.Pertinent negatives include no chest pain, no abdominal pain, no headaches and no shortness of breath. The symptoms are aggravated by bending and twisting. Nothing relieves the symptoms. He has tried nothing for the symptoms. The treatment provided no relief.       Past Medical History:  Diagnosis Date  . CAD (coronary artery disease), native coronary artery    Catheterization 2008 showing moderate nonobstructive disease in LAD, circumflex and right coronary artery, no lesions greater than 40%.  EF of 35% at that time.   Marland Kitchen GERD (gastroesophageal reflux disease)   . History of kidney stones   . Hypercholesterolemia   . Hyperlipidemia 12/02/2008  . Hypertension   . Kidney disease, chronic, stage III (GFR 30-59 ml/min) (HCC) 08/29/2014  . Paroxysmal atrial fibrillation (HCC) 08/29/2014   Chads2VASC score 5   . Transient global amnesia 2008   . Type 2 diabetes mellitus with renal manifestations, controlled [facility])     Patient Active Problem List   Diagnosis Date Noted  . Pain in joint of right shoulder 01/07/2020  . Labile blood pressure 08/24/2019  . DOE (dyspnea on exertion) 08/24/2019  . UTI (urinary tract infection) 04/29/2015  . SIRS (systemic inflammatory response syndrome)  (HCC) 04/29/2015  . Bronchitis, chronic obstructive w acute bronchitis (HCC) 04/29/2015  . Arterial hypotension   . Hypotension 04/28/2015  . Chronic anticoagulation 09/21/2014  . Kidney disease, chronic, stage III (GFR 30-59 ml/min) (HCC) 08/29/2014  . Paroxysmal atrial fibrillation (HCC) 08/29/2014  . Hypertension 08/29/2014  . Unstable angina pectoris (HCC) 08/29/2014  . History of kidney stones   . GERD (gastroesophageal reflux disease) 01/31/2012  . Type 2 diabetes mellitus with renal manifestations, controlled (HCC)   . Hyperlipidemia 12/02/2008  . Nonocclusive coronary atherosclerosis of native coronary artery     Past Surgical History:  Procedure Laterality Date  . CYSTOSCOPY W/ URETERAL STENT PLACEMENT Left 03/06/2013   Procedure: CYSTOSCOPY WITH RETROGRADE PYELOGRAM/URETERAL STENT PLACEMENT;  Surgeon: Anner Crete, MD;  Location: [facility] ORS;  Service: Urology;  Laterality: Left;  . JOINT REPLACEMENT    . TONSILLECTOMY    . TOTAL HIP ARTHROPLASTY Right        Family History  Problem Relation Age of Onset  . Heart attack Father   . Emphysema Brother     Social History   Tobacco Use  . Smoking status: Never Smoker  . Smokeless tobacco: Never Used  Substance Use Topics  . Alcohol use: Yes    Alcohol/week: 1.0 standard drink    Types: 1 Cans of beer per week    Comment: occasionally  . Drug use: No    Home Medications Prior to Admission medications  Medication Sig Start Date End Date Taking? Authorizing Provider  Accu-Chek FastClix Lancets MISC Use to self-monitor blood glucose four times daily (dx 250.02) 07/21/13   [provider]  albuterol (PROVENTIL HFA;VENTOLIN HFA) 108 (90 BASE) MCG/ACT inhaler Inhale 2 puffs into the lungs every 6 (six) hours as needed for wheezing or shortness of breath. 04/30/15   Reyne Dumas, MD  apixaban (ELIQUIS) 2.5 MG TABS tablet Take 1 tablet by mouth 2 (two) times daily. 08/11/18   [provider]  aspirin EC 81  MG tablet Take 1 tablet (81 mg total) by mouth daily. 08/31/14   Lyda Jester M, PA-C  atorvastatin (LIPITOR) 40 MG tablet Take 1 tablet by mouth daily. 02/21/17   [provider]  Calcium Carb-Cholecalciferol (CALCIUM 600+D) 600-800 MG-UNIT TABS Take 1 tablet by mouth daily. 07/08/18   [provider]  Continuous Blood Gluc Sensor (FREESTYLE LIBRE 14 DAY SENSOR) MISC Apply q14 days, testing >4x/day, multiple daily injections   E11.51 07/08/18   [provider]  Cyanocobalamin (B-12) 1000 MCG TABS Take 1 tablet by mouth daily. 07/08/18   [provider]  cycloSPORINE (RESTASIS) 0.05 % ophthalmic emulsion Place 2 drops 2 (two) times daily into both eyes.     [provider]  Dextran 70-Hypromellose (GENTEAL TEARS) 0.1-0.3 % SOLN as needed. 07/08/18   [provider]  Docusate Sodium (STOOL SOFTENER) 100 MG capsule Take $RemoveBefo'100mg'FbRjkHKnOcT$  by mouth daily 07/08/18   [provider]  fish oil-omega-3 fatty acids 1000 MG capsule Take 1 capsule (1 g total) by mouth daily. Stop taking if having diarrhea Patient taking differently: Take 1 g by mouth at bedtime.  02/05/12   Rai, Ripudeep K, MD  glucose blood (FREESTYLE LITE) test strip Dx 250.4 Pt checking sugars three times daily 10/01/12   [provider]  glucose blood test strip FreeStyle Lite Strips    [provider]  hypromellose (GENTEAL SEVERE) 0.3 % GEL ophthalmic ointment Place 1 application into both eyes as needed for dry eyes.    [provider]  insulin aspart (NOVOLOG FLEXPEN) 100 UNIT/ML FlexPen  01/11/19   [provider]  insulin detemir (LEVEMIR) 100 UNIT/ML injection Inject 24 Units at bedtime into the skin.     [provider]  Insulin Pen Needle (B-D UF III MINI PEN NEEDLES) 31G X 5 MM MISC USE UP TO 10 NEEDLES DAILY WITH BASAL AND BOLUS INSULIN Dx: E11.29 07/24/17   [provider]  Insulin Pen Needle 31G X 5 MM MISC BD Ultra-Fine Mini Pen Needle  31 gauge x 3/16"    [provider]  isosorbide mononitrate (IMDUR) 30 MG 24 hr tablet Take 1 tablet by mouth every morning. 08/11/18   [provider]  LEVEMIR FLEXTOUCH 100 UNIT/ML FlexPen Inject into the skin. 02/03/20   [provider]  linagliptin (TRADJENTA) 5 MG TABS tablet Take 1 tablet by mouth daily.    [provider]  metoprolol succinate (TOPROL-XL) 25 MG 24 hr tablet 1 tablet    [provider]  metoprolol tartrate (LOPRESSOR) 25 MG tablet Take 1 tablet (25 mg total) by mouth 2 (two) times daily. 07/15/15   Lyda Jester M, PA-C  Multiple Vitamins-Minerals (CENTRUM ADULTS PO) 1 tablet once daily 03/22/09   [provider]  Multiple Vitamins-Minerals (MULTIVITAMIN WITH MINERALS) tablet Take 1 tablet by mouth daily.     [provider]  nitroGLYCERIN (NITROSTAT) 0.4 MG SL tablet Place 1 tablet (0.4 mg total) under the  tongue every 5 (five) minutes x 3 doses as needed for chest pain. 08/24/19   Buford Dresser, MD  omeprazole (PRILOSEC OTC) 20 MG tablet 1 tablet once daily 03/22/09   [provider]  omeprazole (PRILOSEC) 20 MG capsule Take 20 mg every evening by mouth.     [provider]  Loma Boston (OYSTER CALCIUM) 500 MG TABS tablet Take 1 tablet by mouth 2 (two) times daily. 03/22/09   [provider]  Phenazopyridine HCl & UTI Test (URISTAT UTI RELIEF PAK) 95 MG THPK 2 tablets qd PRN 07/08/18   [provider]  pregabalin (LYRICA) 50 MG capsule 1 tablet po qhs 06/21/16   [provider]  pyridOXINE (VITAMIN B-6) 100 MG tablet Take 100 mg by mouth 2 (two) times daily.     [provider]  timolol (BETIMOL) 0.5 % ophthalmic solution Place 1 drop into the left eye 2 (two) times daily.     [provider]  timolol (TIMOPTIC) 0.5 % ophthalmic solution  11/18/19   [provider]  triamcinolone (KENALOG) 0.1 % Apply to affected areas BID 11/16/16    [provider]    Crockett [lifitegrast]  Review of Systems   Review of Systems  Constitutional: Negative for fever.  HENT: Negative for sore throat.   Eyes: Negative for visual disturbance.  Respiratory: Negative for shortness of breath.   Cardiovascular: Negative for chest pain.  Gastrointestinal: Negative for abdominal pain.  Genitourinary: Negative for dysuria.  Musculoskeletal: Positive for back pain.  Skin: Negative for rash.  Neurological: Negative for headaches.    Physical Exam Updated Vital Signs BP (!) 154/86 (BP Location: Right Arm)   Pulse 93   Temp 97.6 F (36.4 C) (Oral)   Resp 19   Ht 6' (1.829 m)   Wt 90 kg   SpO2 98%   BMI 26.91 kg/m   Physical Exam Vitals and nursing note reviewed.  Constitutional:      Appearance: Normal appearance. He is well-developed.  HENT:     Head: Normocephalic and atraumatic.  Eyes:     Conjunctiva/sclera: Conjunctivae normal.  Cardiovascular:     Rate and Rhythm: Normal rate and regular rhythm.     Heart sounds: No murmur heard.   Pulmonary:     Effort: Pulmonary effort is normal. No respiratory distress.     Breath sounds: Normal breath sounds.  Abdominal:     Palpations: Abdomen is soft.     Tenderness: There is no abdominal tenderness.  Musculoskeletal:        General: No deformity. Normal range of motion.     Cervical back: Neck supple.     Comments: He has a skin tear on the medial aspect of his left fifth finger.  Full range of motion of upper extremities without any pain or limitations.  Full range of motion of left lower extremity without any pain or limitation.  Full range of motion with pain in right hip with range of motion.  Diffuse thoracic and lumbar tenderness.  No obvious step-offs.  Distal pulses motor and sensation intact.  Skin:    General: Skin is warm and dry.  Neurological:     General: No focal deficit present.     Mental Status: He is alert.     Sensory: No sensory  deficit.     Motor: No weakness.     ED Results / Procedures / Treatments   Labs (all labs ordered are listed,  but only abnormal results are displayed) Labs Reviewed  BASIC METABOLIC PANEL - Abnormal; Notable for the following components:      Result Value   Glucose, Bld 165 (*)    BUN 28 (*)    Creatinine, Ser 1.53 (*)    GFR, Estimated 42 (*)    All other components within normal limits  CBC WITH DIFFERENTIAL/PLATELET - Abnormal; Notable for the following components:   WBC 16.4 (*)    Neutro Abs 13.6 (*)    Abs Immature Granulocytes 0.16 (*)    All other components within normal limits  LACTIC ACID, PLASMA - Abnormal; Notable for the following components:   Lactic Acid, Venous 2.1 (*)    All other components within normal limits  LACTIC ACID, PLASMA - Abnormal; Notable for the following components:   Lactic Acid, Venous 2.7 (*)    All other components within normal limits  HEMOGLOBIN A1C - Abnormal; Notable for the following components:   Hgb A1c MFr Bld 8.4 (*)    All other components within normal limits  CBC - Abnormal; Notable for the following components:   WBC 19.5 (*)    RBC 4.11 (*)    Hemoglobin 12.8 (*)    Platelets 127 (*)    All other components within normal limits  BASIC METABOLIC PANEL - Abnormal; Notable for the following components:   CO2 21 (*)    Glucose, Bld 341 (*)    BUN 32 (*)    Creatinine, Ser 1.84 (*)    Calcium 8.8 (*)    GFR, Estimated 34 (*)    All other components within normal limits  GLUCOSE, CAPILLARY - Abnormal; Notable for the following components:   Glucose-Capillary 332 (*)    All other components within normal limits  GLUCOSE, CAPILLARY - Abnormal; Notable for the following components:   Glucose-Capillary 333 (*)    All other components within normal limits  RESP PANEL BY RT-PCR (FLU A&B, COVID) ARPGX2  MRSA PCR SCREENING  CULTURE, BLOOD (ROUTINE X 2)  CULTURE, BLOOD (ROUTINE X 2)  URINE CULTURE  PROCALCITONIN   URINALYSIS, ROUTINE W REFLEX MICROSCOPIC    EKG None  Radiology DG Chest 1 View  Result Date: 09/29/2020 CLINICAL DATA:  Loss of balance, fall, hip pain EXAM: CHEST  1 VIEW COMPARISON:  08/10/2019 FINDINGS: The heart size and mediastinal contours are within normal limits. Both lungs are clear. The visualized skeletal structures are unremarkable. IMPRESSION: No active disease. Electronically Signed   By: Randa Ngo M.D.   On: 09/29/2020 15:20   CT Head Wo Contrast  Result Date: 09/29/2020 CLINICAL DATA:  Loss of balance, fell, hip pain EXAM: CT HEAD WITHOUT CONTRAST TECHNIQUE: Contiguous axial images were obtained from the base of the skull through the vertex without intravenous contrast. COMPARISON:  01/31/2012 FINDINGS: Brain: Hypodensities within the periventricular white matter are most consistent with chronic small vessel ischemic changes. No acute infarct or hemorrhage. Lateral ventricles and remaining midline structures are unremarkable. There are no acute extra-axial fluid collections. CSF attenuation fluid along the cerebral convexities measure up to 0.7 cm on the right and 0.9 cm on the left, consistent with subdural hygromas or chronic subdural hematomas. These are new since prior study. No mass effect. Vascular: No hyperdense vessel or unexpected calcification. Skull: Normal. Negative for fracture or focal lesion. Sinuses/Orbits: Mild polypoid mucosal thickening within the bilateral maxillary sinuses unchanged. Remaining sinuses are clear. Other: None. IMPRESSION: 1. Extra-axial CSF attenuation collections along the bilateral convexities, consistent  with subdural hygromas or chronic subdural hematoma. No mass effect. 2. No acute intracranial hemorrhage. 3. Chronic small vessel ischemic changes within the white matter. No acute infarct. Electronically Signed   By: Randa Ngo M.D.   On: 09/29/2020 18:38   CT Cervical Spine Wo Contrast  Result Date: 09/29/2020 CLINICAL DATA:  [place],  loss of balance EXAM: CT CERVICAL SPINE WITHOUT CONTRAST TECHNIQUE: Multidetector CT imaging of the cervical spine was performed without intravenous contrast. Multiplanar CT image reconstructions were also generated. COMPARISON:  None. FINDINGS: Alignment: Right convex scoliosis centered at C5. Otherwise alignment is anatomic. Skull base and vertebrae: No acute fracture. No primary bone lesion or focal pathologic process. Soft tissues and spinal canal: No prevertebral fluid or swelling. No visible canal hematoma. 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 Prominent spondylosis at C3-4, C5-6, and C6-7. Mild diffuse bilateral facet hypertrophy. Neural foraminal narrowing most pronounced at C3-4 and C5-6. Upper chest: Airway is patent.  Lung apices are clear. Other: Reconstructed images demonstrate no additional findings. IMPRESSION: 1. No acute cervical spine fracture. 2. Extensive multilevel cervical spondylosis and facet hypertrophy. Electronically Signed   By: Randa Ngo M.D.   On: 09/29/2020 18:40   CT Thoracic Spine Wo Contrast  Result Date: 09/29/2020 CLINICAL DATA:  Ground level fall at home using walker in the bathroom. Lost balance leading to fall. Back pain. EXAM: CT THORACIC SPINE WITHOUT CONTRAST TECHNIQUE: Multidetector CT images of the thoracic were obtained using the standard protocol without intravenous contrast. COMPARISON:  No prior thoracic spine imaging. Chest radiograph 05/16/2017 reviewed FINDINGS: Alignment: Exaggerated upper thoracic kyphosis. Vertebrae: Acute T6 compression fracture. 40% loss of height anteriorly. There is posterior cortex involvement with cortical buckling of bony retropulsion of 2 mm. There is a nondisplaced fracture component that involves the spinous process of T6. No convincing pedicle or laminar involvement. No transverse process involvement. No other acute fracture. Occasional Schmorl's nodes as well as multilevel  anterior spurring. Paraspinal and other soft tissues: 4 mm left upper lobe pulmonary nodule, series 3, image 40. Hypoventilatory changes in the dependent lungs. Calcified right hilar lymph nodes consistent with prior granulomatous disease. No acute fracture of included ribs. Disc levels: There is mild bony canal narrowing at T6 related to mild bony retropulsion. No other canal narrowing. Minor disc space narrowing in the lower thoracic spine with anterior spurring. IMPRESSION: 1. Acute T6 compression fracture with 40% loss of height anteriorly and 2 mm bony retropulsion. Mild bony canal narrowing at T6 related to bony retropulsion. Nondisplaced fracture extends through the spinous process of T6. 2. No other acute fracture of the thoracic spine. 3. Incidental note of a left upper lobe pulmonary nodule measuring 5mm. No prior exams available for comparison. Follow-up recommendation for a nodule of this size as follows, giving consideration to patient's advanced age: No follow-up needed if patient is low-risk. Non-contrast chest CT can be considered in 12 months if patient is high-risk. This recommendation follows the consensus statement: Guidelines for Management of Incidental Pulmonary Nodules Detected on CT Images: From the Fleischner Society 2017; Radiology 2017; 284:228-243. Electronically Signed   By: Keith Rake M.D.   On: 09/29/2020 18:43   MR THORACIC SPINE WO CONTRAST  Result Date: 09/29/2020 CLINICAL DATA:  Back pain EXAM: MRI THORACIC SPINE WITHOUT CONTRAST TECHNIQUE: Multiplanar, multisequence MR imaging of the thoracic spine was performed. No intravenous contrast was administered. COMPARISON:  None. FINDINGS: Truncated examination as patient refused postcontrast imaging.  Seven series provided. Alignment: Normal Vertebrae: There is an acute compression fracture of T6 with approximately 75% anterior height loss. There is retropulsion of 3 mm. Cord: Normal signal and morphology. There is a ventral  epidural collection extending from T3-T12. Paraspinal and other soft tissues: Negative. Disc levels: Mild spinal canal narrowing at the T6 level due to a slight retropulsion of T6. IMPRESSION: 1. Truncated examination as patient refused postcontrast imaging. 2. Acute compression fracture of T6 with approximately 75% anterior height loss and 3 mm retropulsion. 3. Ventral epidural collection extending from T3-T12, favored to be an epidural hematoma, particularly as the patient is on anticoagulation therapy. This narrows the thecal sac and exerts mass effect on the spinal cord. Critical Value/emergent results were called by telephone at the time of interpretation on 09/29/2020 at 11:12 pm to provider [facility] , who verbally acknowledged these results. Electronically Signed   By: Ulyses Jarred M.D.   On: 09/29/2020 23:12   CT L-SPINE NO CHARGE  Result Date: 09/29/2020 CLINICAL DATA:  Ground level fall at home all using walker in the bathroom. Lost balance leading to fall. Low back pain. EXAM: CT LUMBAR SPINE WITHOUT CONTRAST TECHNIQUE: Multidetector CT imaging of the lumbar spine was performed without intravenous contrast administration. Multiplanar CT image reconstructions were also generated. COMPARISON:  Reformats from abdominopelvic CT 02/21/2015 FINDINGS: Segmentation: 5 lumbar type vertebrae. Alignment: Normal. Vertebrae: Mild L1 superior endplate compression fracture is chronic. There is a prominent Schmorl's node involving L4 that is chronic. Undulation of inferior L5 endplate is chronic. There is no evidence of acute lumbar fracture. There is an acute fractures through right aspect of the sacrum extending through the sacral foramen, zone 2 fracture. Paraspinal and other soft tissues: Assistant concurrent abdominopelvic CT, reported separately. Disc levels: Minor L5-S1 and L1-L2 disc space narrowing. There is facet hypertrophy at L3-L4 and L5-S1. IMPRESSION: 1. Acute minimally displaced 7 2 right  sacral fracture extending through the sacral foramen. 2. No evidence of acute lumbar fracture. 3. Chronic mild L1 compression fracture, unchanged from 2016. Electronically Signed   By: Keith Rake M.D.   On: 09/29/2020 18:49   CT Renal Stone Study  Result Date: 09/29/2020 CLINICAL DATA:  Flank pain.  Kidney stone suspected. EXAM: CT ABDOMEN AND PELVIS WITHOUT CONTRAST TECHNIQUE: Multidetector CT imaging of the abdomen and pelvis was performed following the standard protocol without IV contrast. COMPARISON:  February 21, 2015 FINDINGS: Lower chest: Mild dependent airspace consolidation versus atelectasis, right greater than left. Hepatobiliary: No focal liver abnormality is seen. No gallstones, gallbladder wall thickening, or biliary dilatation. Pancreas: Unremarkable. No pancreatic ductal dilatation or surrounding inflammatory changes. Spleen: Scattered splenic granulomata 1.6 cm circumscribed hypoattenuated lesion in the anterior aspect of the spleen. Adrenals/Urinary Tract: Normal adrenal glands. No evidence of hydronephrosis or obstructive uropathy. 3 mm nonobstructive calculus in the lower pole of the right kidney. 2.7 x 2.1 cm solid mass within the mid polar medial region of the right kidney. 5.3 cm rim calcified exophytic left renal cyst off of the mid polar region of the kidney. Stomach/Bowel: Stomach is within normal limits. No evidence of bowel wall thickening, distention, or inflammatory changes. Vascular/Lymphatic: Aortic atherosclerosis. No enlarged abdominal or pelvic lymph nodes. Reproductive: Nonspecific coarse calcifications of the prostate gland. Other: No abdominal wall hernia or abnormality. No abdominopelvic ascites. Musculoskeletal: Right total hip arthroplasty. No evidence of acute fractures. IMPRESSION: 1. 2.7 cm indeterminate density mass within the mid polar medial region of the right kidney, not significantly changed  from 2016 2. No evidence of obstructive uropathy. 3 mm  nonobstructive right lower pole renal calculus. 3. 5.3 cm rim calcified exophytic left renal cyst, stable from 2016. 4. 1.6 cm circumscribed hypoattenuated lesion in the anterior aspect of the spleen, nonspecific. 5. Mild dependent airspace consolidation versus atelectasis, right greater than left. Aortic Atherosclerosis (ICD10-I70.0). Electronically Signed   By: Fidela Salisbury M.D.   On: 09/29/2020 18:49   DG Hip Unilat With Pelvis 2-3 Views Right  Result Date: 09/29/2020 CLINICAL DATA:  [place], right hip pain EXAM: DG HIP (WITH OR WITHOUT PELVIS) 2-3V RIGHT COMPARISON:  None. FINDINGS: Frontal view of the pelvis as well as frontal and cross-table lateral views of the right hip are obtained. The distal margin of the femoral component of the right hip arthroplasty is excluded by collimation on the lateral view. Bones are diffusely osteopenic. There is a right hip hemiarthroplasty, with long stem femoral component and cerclage wires. Alignment is anatomic. There are no acute displaced fractures. Heterotopic ossification surrounds the right hip arthroplasty. Mild left hip osteoarthritis. Sacroiliac joints are normal. IMPRESSION: 1. No acute displaced pelvic fracture. 2. Unremarkable right hip arthroplasty. Portions of the distal margin of the femoral component of the arthroplasty are excluded by collimation on the lateral view. Electronically Signed   By: Randa Ngo M.D.   On: 09/29/2020 15:19    Procedures Procedures   Medications Ordered in ED Medications - No data to display  ED Course  I have reviewed the triage vital signs and the nursing notes.  Pertinent labs & imaging results that were available during my care of the patient were reviewed by me and considered in my medical decision making (see chart for details).  Clinical Course as of 09/30/20 1006  Thu Sep 29, 2020  1524 Chest x-ray and right hip x-ray interpreted by me as no acute findings. [MB]  9373 Left small finger skin tear  dressed with some nonstick dressing and bulky wrap.  No active bleeding. [MB]  1915 Discussed with Dr. Reatha Armour neurosurgery.  He said that the thoracic fracture is unstable and he will need a TLSO clamshell and logroll to get it applied. [MB]  1945 Discussed with orthopedics Dr.Xu who reviewed the images.  He said this is likely go to be nonoperative and supportive care weightbearing as tolerated. [MB]  2100 Dr. Reatha Armour called me back again.  He felt that the patient would benefit from an admission to [facility] facility and an MRI of his thoracic spine does make sure he is not an operative candidate.  I relayed this to admitting hospitalist Dr. Marlowe Sax [MB]    Clinical Course User Index [MB] Hayden Rasmussen, MD   MDM Rules/Calculators/A&P                         This patient complains of fall, back pain, pelvis and right hip pain; this involves an extensive number of treatment Options and is a complaint that carries with it a high risk of complications and Morbidity. The differential includes fracture, contusion, head bleed, dislocation sepsis, arrhythmia, dehydration, failure to thrive  I ordered, reviewed and interpreted labs, which included CBC with elevated white count, stable hemoglobin, chemistries with elevated glucose and creatinine similar to priors, lactate mildly elevated, Covid testing negative, urinalysis blood culture urine culture ordered and pending I ordered medication IV fluids IV pain medication I ordered imaging studies which included CT head cervical spine thoracic and lumbar spines and I  independently    visualized and interpreted imaging which showed T4 fracture, sacral fracture Additional history obtained from patient's wife and EMS Previous records obtained and reviewed in epic, no recent visits I consulted neurosurgery Dr. Reatha Armour, orthopedics Dr.Xu and [facility] hospitalist Dr. Marlowe Sax and discussed lab and imaging findings  Critical Interventions: None  After the  interventions stated above, I reevaluated the patient and found patient's pain to be better controlled.  He remains neurologically intact.  He will need admission to the hospital for further management of his symptoms and continued neurosurgical evaluation.   Final Clinical Impression(s) / ED Diagnoses Final diagnoses:  Back pain  Closed minimally displaced zone II fracture of sacrum, initial encounter ([place])  Fall, initial encounter  Closed unstable burst fracture of sixth thoracic vertebra, initial encounter [facility])    Rx / [place] Orders ED Discharge Orders    None       Hayden Rasmussen, MD 09/30/20 1011    Hayden Rasmussen, MD 09/30/20 1013

## 2020-09-29 NOTE — ED Notes (Signed)
Pt returned to exam room via stretcher from CT. Per CT tech, pt's right ac IV is infiltrated. This nurse assessed the IV, d/c'd it with catheter tip intact and ice pack was applied. 22g iv patent in right wrist.

## 2020-09-29 NOTE — ED Notes (Signed)
Patient transported to MRI 

## 2020-09-29 NOTE — ED Triage Notes (Signed)
Pt presents to the ED via EMS following a GLF from home while using his walker in the bathroom the pt fell and lost his balance. The pt denied to EMS hitting his head or losing consciousness, however he c/o right hip pain, and lower back pain and there is a skin tear to the right pinking, dressed by EMS prior to arrival. Per EMS< the pt received 68mcg of IV Fentanyl en route.

## 2020-09-29 NOTE — Progress Notes (Signed)
Spoke with Dr. Almon Register.  I reviewed the CT scan which shows a minimally displaced sacral fracture.  Recommend weightbearing as tolerated to bilateral lower extremities and symptomatic treatment and mobilization with PT as tolerated.  Patient may follow-up with me in about a month.

## 2020-09-29 NOTE — Progress Notes (Signed)
Thoracic MRI is showing a large epidural hematoma with narrowing of the thecal sac and mass-effect on the spinal cord.  No motor deficit on exam at this time.  I spoke to the patient's wife and discussed MRI findings.  Wife has informed me that the patient was previously on Eliquis for A. fib and was also taking aspirin, however, both Eliquis and aspirin were stopped 3 months ago as he was having hematuria at that time which has now resolved.  Wife confirms that he is not on any anticoagulation or antiplatelet agent for the past 3 months.  I also spoke to Dr. Reatha Armour from neurosurgery and updated him.  Patient is being transferred to [facility], neurosurgery will see the patient when he arrives [facility].  Continue frequent neurochecks.

## 2020-09-29 NOTE — ED Notes (Signed)
Critical lactic of 2.1. Received from lab. Provider notified and aware.

## 2020-09-29 NOTE — Progress Notes (Signed)
Called [facility] for [place] braceOrthopedic Tech Progress Note Patient Details:  REICHEN HUTZLER 03/03/1925 [id number]  Patient ID: [name], male   DOB: 08/20/1924, 87 y.o.   MRN: [id number]   [place] Pink 09/29/2020, 7:45 PMCalled and routed TLSO brace order to [place] [facility].

## 2020-09-29 NOTE — ED Notes (Signed)
Provider aware of pt's hypotensive BP morphine d/c'd and fentanyl not given. Pt receiving 500cc NS bolus and 1,000cc NS bolus at this time. BC and lactic drawn per provider's order. Pt attached to cardiac monitor x3. EKG obtained and given to ED provider. Pt unable to undergo CT d/t inability to lie flat.

## 2020-09-29 NOTE — ED Notes (Signed)
Pt declined CT at this time. Provider aware.

## 2020-09-30 ENCOUNTER — Other Ambulatory Visit: Payer: Self-pay

## 2020-09-30 DIAGNOSIS — W010XXA Fall on same level from slipping, tripping and stumbling without subsequent striking against object, initial encounter: Secondary | ICD-10-CM | POA: Diagnosis not present

## 2020-09-30 DIAGNOSIS — I2510 Atherosclerotic heart disease of native coronary artery without angina pectoris: Secondary | ICD-10-CM | POA: Diagnosis present

## 2020-09-30 DIAGNOSIS — S3210XD Unspecified fracture of sacrum, subsequent encounter for fracture with routine healing: Secondary | ICD-10-CM | POA: Diagnosis not present

## 2020-09-30 DIAGNOSIS — W19XXXA Unspecified fall, initial encounter: Secondary | ICD-10-CM | POA: Diagnosis not present

## 2020-09-30 DIAGNOSIS — I10 Essential (primary) hypertension: Secondary | ICD-10-CM | POA: Diagnosis not present

## 2020-09-30 DIAGNOSIS — M549 Dorsalgia, unspecified: Secondary | ICD-10-CM | POA: Diagnosis not present

## 2020-09-30 DIAGNOSIS — F0390 Unspecified dementia without behavioral disturbance: Secondary | ICD-10-CM | POA: Diagnosis present

## 2020-09-30 DIAGNOSIS — E1122 Type 2 diabetes mellitus with diabetic chronic kidney disease: Secondary | ICD-10-CM

## 2020-09-30 DIAGNOSIS — R279 Unspecified lack of coordination: Secondary | ICD-10-CM | POA: Diagnosis not present

## 2020-09-30 DIAGNOSIS — Z7901 Long term (current) use of anticoagulants: Secondary | ICD-10-CM | POA: Diagnosis not present

## 2020-09-30 DIAGNOSIS — E872 Acidosis: Secondary | ICD-10-CM | POA: Diagnosis present

## 2020-09-30 DIAGNOSIS — I6200 Nontraumatic subdural hemorrhage, unspecified: Secondary | ICD-10-CM | POA: Diagnosis not present

## 2020-09-30 DIAGNOSIS — Y92012 Bathroom of single-family (private) house as the place of occurrence of the external cause: Secondary | ICD-10-CM | POA: Diagnosis not present

## 2020-09-30 DIAGNOSIS — K219 Gastro-esophageal reflux disease without esophagitis: Secondary | ICD-10-CM | POA: Diagnosis present

## 2020-09-30 DIAGNOSIS — Z743 Need for continuous supervision: Secondary | ICD-10-CM | POA: Diagnosis not present

## 2020-09-30 DIAGNOSIS — Z66 Do not resuscitate: Secondary | ICD-10-CM | POA: Diagnosis present

## 2020-09-30 DIAGNOSIS — G8220 Paraplegia, unspecified: Secondary | ICD-10-CM | POA: Diagnosis not present

## 2020-09-30 DIAGNOSIS — I480 Paroxysmal atrial fibrillation: Secondary | ICD-10-CM | POA: Diagnosis present

## 2020-09-30 DIAGNOSIS — E7800 Pure hypercholesterolemia, unspecified: Secondary | ICD-10-CM | POA: Diagnosis present

## 2020-09-30 DIAGNOSIS — Z515 Encounter for palliative care: Secondary | ICD-10-CM | POA: Diagnosis not present

## 2020-09-30 DIAGNOSIS — Z794 Long term (current) use of insulin: Secondary | ICD-10-CM

## 2020-09-30 DIAGNOSIS — Z20822 Contact with and (suspected) exposure to covid-19: Secondary | ICD-10-CM | POA: Diagnosis present

## 2020-09-30 DIAGNOSIS — N179 Acute kidney failure, unspecified: Secondary | ICD-10-CM | POA: Diagnosis present

## 2020-09-30 DIAGNOSIS — S22050D Wedge compression fracture of T5-T6 vertebra, subsequent encounter for fracture with routine healing: Secondary | ICD-10-CM | POA: Diagnosis not present

## 2020-09-30 DIAGNOSIS — S32121A Minimally displaced Zone II fracture of sacrum, initial encounter for closed fracture: Secondary | ICD-10-CM | POA: Diagnosis present

## 2020-09-30 DIAGNOSIS — I959 Hypotension, unspecified: Secondary | ICD-10-CM | POA: Diagnosis present

## 2020-09-30 DIAGNOSIS — N183 Chronic kidney disease, stage 3 unspecified: Secondary | ICD-10-CM

## 2020-09-30 DIAGNOSIS — M4804 Spinal stenosis, thoracic region: Secondary | ICD-10-CM | POA: Diagnosis present

## 2020-09-30 DIAGNOSIS — E785 Hyperlipidemia, unspecified: Secondary | ICD-10-CM

## 2020-09-30 DIAGNOSIS — S22052A Unstable burst fracture of T5-T6 vertebra, initial encounter for closed fracture: Secondary | ICD-10-CM | POA: Diagnosis not present

## 2020-09-30 DIAGNOSIS — S22050S Wedge compression fracture of T5-T6 vertebra, sequela: Secondary | ICD-10-CM | POA: Diagnosis not present

## 2020-09-30 DIAGNOSIS — I129 Hypertensive chronic kidney disease with stage 1 through stage 4 chronic kidney disease, or unspecified chronic kidney disease: Secondary | ICD-10-CM | POA: Diagnosis present

## 2020-09-30 DIAGNOSIS — R911 Solitary pulmonary nodule: Secondary | ICD-10-CM | POA: Diagnosis present

## 2020-09-30 DIAGNOSIS — S24152A Other incomplete lesion at T2-T6 level of thoracic spinal cord, initial encounter: Secondary | ICD-10-CM | POA: Diagnosis present

## 2020-09-30 DIAGNOSIS — R4182 Altered mental status, unspecified: Secondary | ICD-10-CM | POA: Diagnosis not present

## 2020-09-30 DIAGNOSIS — S064X0S Epidural hemorrhage without loss of consciousness, sequela: Secondary | ICD-10-CM | POA: Diagnosis not present

## 2020-09-30 DIAGNOSIS — S22050A Wedge compression fracture of T5-T6 vertebra, initial encounter for closed fracture: Secondary | ICD-10-CM | POA: Diagnosis not present

## 2020-09-30 DIAGNOSIS — Z96641 Presence of right artificial hip joint: Secondary | ICD-10-CM | POA: Diagnosis present

## 2020-09-30 DIAGNOSIS — I6203 Nontraumatic chronic subdural hemorrhage: Secondary | ICD-10-CM | POA: Diagnosis present

## 2020-09-30 DIAGNOSIS — S22058A Other fracture of T5-T6 vertebra, initial encounter for closed fracture: Secondary | ICD-10-CM | POA: Diagnosis present

## 2020-09-30 DIAGNOSIS — N1832 Chronic kidney disease, stage 3b: Secondary | ICD-10-CM | POA: Diagnosis present

## 2020-09-30 DIAGNOSIS — Z87442 Personal history of urinary calculi: Secondary | ICD-10-CM | POA: Diagnosis not present

## 2020-09-30 LAB — BASIC METABOLIC PANEL
Anion gap: 10 (ref 5–15)
BUN: 32 mg/dL — ABNORMAL HIGH (ref 8–23)
CO2: 21 mmol/L — ABNORMAL LOW (ref 22–32)
Calcium: 8.8 mg/dL — ABNORMAL LOW (ref 8.9–10.3)
Chloride: 109 mmol/L (ref 98–111)
Creatinine, Ser: 1.84 mg/dL — ABNORMAL HIGH (ref 0.61–1.24)
GFR, Estimated: 34 mL/min — ABNORMAL LOW (ref >60)
Glucose, Bld: 341 mg/dL — ABNORMAL HIGH (ref 70–99)
Potassium: 4.9 mmol/L (ref 3.5–5.1)
Sodium: 140 mmol/L (ref 135–145)

## 2020-09-30 LAB — HEMOGLOBIN A1C
Hgb A1c MFr Bld: 8.4 % — ABNORMAL HIGH (ref 4.8–5.6)
Mean Plasma Glucose: 194.38 mg/dL

## 2020-09-30 LAB — CBC
HCT: 40 % (ref 39.0–52.0)
Hemoglobin: 12.8 g/dL — ABNORMAL LOW (ref 13.0–17.0)
MCH: 31.1 pg (ref 26.0–34.0)
MCHC: 32 g/dL (ref 30.0–36.0)
MCV: 97.3 fL (ref 80.0–100.0)
Platelets: 127 10*3/uL — ABNORMAL LOW (ref 150–400)
RBC: 4.11 MIL/uL — ABNORMAL LOW (ref 4.22–5.81)
RDW: 13.9 % (ref 11.5–15.5)
WBC: 19.5 10*3/uL — ABNORMAL HIGH (ref 4.0–10.5)
nRBC: 0 % (ref 0.0–0.2)

## 2020-09-30 LAB — GLUCOSE, CAPILLARY
Glucose-Capillary: 332 mg/dL — ABNORMAL HIGH (ref 70–99)
Glucose-Capillary: 333 mg/dL — ABNORMAL HIGH (ref 70–99)
Glucose-Capillary: 293 mg/dL — ABNORMAL HIGH (ref 70–99)
Glucose-Capillary: 314 mg/dL — ABNORMAL HIGH (ref 70–99)
Glucose-Capillary: 367 mg/dL — ABNORMAL HIGH (ref 70–99)

## 2020-09-30 LAB — LACTIC ACID, PLASMA: Lactic Acid, Venous: 2.7 mmol/L (ref 0.5–1.9)

## 2020-09-30 LAB — MRSA PCR SCREENING: MRSA by PCR: NEGATIVE

## 2020-09-30 LAB — PROCALCITONIN: Procalcitonin: 0.56 ng/mL

## 2020-09-30 MED ORDER — ISOSORBIDE MONONITRATE ER 30 MG PO TB24
30.0000 mg | ORAL_TABLET | Freq: Every morning | ORAL | Status: DC
Start: 2020-09-30 — End: 2020-09-30
  Administered 2020-09-30: 30 mg via ORAL
  Filled 2020-09-30: qty 1

## 2020-09-30 MED ORDER — PIPERACILLIN-TAZOBACTAM 3.375 G IVPB 30 MIN
3.3750 g | Freq: Once | INTRAVENOUS | Status: AC
  Administered 2020-09-30: 3.375 g via INTRAVENOUS
  Filled 2020-09-30 (×2): qty 50

## 2020-09-30 MED ORDER — VANCOMYCIN HCL 2000 MG/400ML IV SOLN
2000.0000 mg | Freq: Once | INTRAVENOUS | Status: AC
  Administered 2020-09-30: 2000 mg via INTRAVENOUS
  Filled 2020-09-30: qty 400

## 2020-09-30 MED ORDER — INSULIN ASPART 100 UNIT/ML ~~LOC~~ SOLN: 0.0000 [IU] | Freq: Every day | SUBCUTANEOUS | Status: DC

## 2020-09-30 MED ORDER — FENTANYL CITRATE (PF) 100 MCG/2ML IJ SOLN
25.0000 ug | INTRAMUSCULAR | Status: DC | PRN
  Administered 2020-09-30 – 2020-10-03 (×3): 25 ug via INTRAVENOUS
  Filled 2020-09-30 (×3): qty 2

## 2020-09-30 MED ORDER — SODIUM CHLORIDE 0.9 % IV SOLN
2.0000 g | INTRAVENOUS | Status: DC
  Administered 2020-09-30 – 2020-10-01 (×2): 2 g via INTRAVENOUS
  Filled 2020-09-30 (×2): qty 20

## 2020-09-30 MED ORDER — PIPERACILLIN-TAZOBACTAM 3.375 G IVPB
3.3750 g | Freq: Three times a day (TID) | INTRAVENOUS | Status: DC
  Administered 2020-09-30: 3.375 g via INTRAVENOUS
  Filled 2020-09-30: qty 50

## 2020-09-30 MED ORDER — VANCOMYCIN HCL 750 MG/150ML IV SOLN
750.0000 mg | INTRAVENOUS | Status: DC
  Administered 2020-10-01: 750 mg via INTRAVENOUS
  Filled 2020-09-30 (×2): qty 150

## 2020-09-30 MED ORDER — METOPROLOL SUCCINATE ER 25 MG PO TB24
25.0000 mg | ORAL_TABLET | Freq: Every day | ORAL | Status: DC
  Administered 2020-09-30: 25 mg via ORAL
  Filled 2020-09-30: qty 1

## 2020-09-30 MED ORDER — LACTATED RINGERS IV SOLN: INTRAVENOUS | Status: DC

## 2020-09-30 MED ORDER — INSULIN ASPART 100 UNIT/ML ~~LOC~~ SOLN
0.0000 [IU] | Freq: Three times a day (TID) | SUBCUTANEOUS | Status: DC
  Administered 2020-09-30: 15 [IU] via SUBCUTANEOUS
  Administered 2020-10-01: 11 [IU] via SUBCUTANEOUS
  Administered 2020-10-01: 4 [IU] via SUBCUTANEOUS
  Administered 2020-10-01: 7 [IU] via SUBCUTANEOUS
  Administered 2020-10-02 (×2): 3 [IU] via SUBCUTANEOUS

## 2020-09-30 MED ORDER — OXYCODONE HCL 5 MG PO TABS
5.0000 mg | ORAL_TABLET | Freq: Four times a day (QID) | ORAL | Status: DC | PRN
  Administered 2020-09-30: 10 mg via ORAL
  Filled 2020-09-30: qty 2

## 2020-09-30 MED ORDER — VANCOMYCIN HCL 1000 MG/200ML IV SOLN: 1000.0000 mg | INTRAVENOUS | Status: DC

## 2020-09-30 NOTE — Progress Notes (Signed)
Orthopedic Tech Progress Note Patient Details:  Victor Wallace Aug 09, 1924 [id number] Custom Clamshell TLSO was ordered on 3/31 at 7:45pm  Patient ID: Victor Wallace, male   DOB: 08-Jul-1924, 87 y.o.   MRN: [id number]   Victor Wallace 09/30/2020, 5:51 AM

## 2020-09-30 NOTE — Progress Notes (Signed)
PT Cancellation Note  Patient Details Name: Victor Wallace MRN: [id number] DOB: 30-Sep-1924   Cancelled Treatment:    Reason Eval/Treat Not Completed: Pain limiting ability to participate. Attempted session x2 this morning, with pt getting fitted for clamshell brace 1st attempt and RN reporting pt in too much pain to participate 2nd attempt. Will follow-up later in day as time permits.   Moishe Spice, PT, DPT Acute Rehabilitation Services  Pager: [phone] Office: [place] 09/30/2020, 10:57 AM

## 2020-09-30 NOTE — Progress Notes (Signed)
Due to the risk of worsening AKI with vanc/zosyn, ok to change zosyn to ceftriaxone for now per Dr Roderic Palau.   Change vanc to $Remov'750mg'XgktCw$  IV q24>>AUC 432, scr 1.84  Onnie Boer, PharmD, [place], AAHIVP, CPP Infectious Disease Pharmacist 09/30/2020 1:45 PM

## 2020-09-30 NOTE — Progress Notes (Signed)
Patient arrived on the unit via [place]. Patient alert to person.Wife (spouse) at the bed side. Neurosurgery contacted at [phone] and Dr,Dawley notified.Patient complained of severe back pain. Back Brace on.

## 2020-09-30 NOTE — Evaluation (Signed)
Occupational Therapy Evaluation Patient Details Name: Victor Wallace MRN: [id number] DOB: 04/28/1925 Today's Date: 09/30/2020    History of Present Illness 87 y.o. male who presented 3/31 with low back and R hip pain after he fell at home. Pt found to have acute T6 compression fx with mild bony canal narrowing and acute minimally displaced R sacral fx. MRI of T-spine revealed ventral epidural hematoma extending from T3-T12. CT showing incidental finding of left upper lobe pulmonary nodule measuring 4 mm. CT of cervical spine and head negative for acute incidents. PMH: CAD, HTN, GERD, CKD stage III, paroxysmal A-fib, and DM2.   Clinical Impression   PT admitted with T6 fx and R sacral fx. Pt currently with functional limitiations due to the deficits listed below (see OT problem list). Pt with decreased BP this session and R leg pain preventing progression to EOB. Pt with very high pain long rolling toward R side for brace positioning. [facility] called due to poor fit with HOB elevated and recommend trimming down the brace for better fit. Wife concerned with ability to (A) at home but reports that CIR is higher level than he was doing at home PTA. Wife is open to hearing about any attempts to help him come home with her that helps her manage him. Wife educated that home with a lot of help or SNF are the options at this time from a rehab perspective. Wife concerned with pt mental state at SNF.  Pt will benefit from skilled OT to increase their independence and safety with adls and balance to allow discharge SNF pending a home plan that allows wife to have more (A). Son lives in the home and can (A) some but not at all times. Wife will be primary caregiver.     Follow Up Recommendations  SNF;Supervision/Assistance - 24 hour;Other (comment) (would need home aide if to d/c home with wife.)    [facility] bed;Other (comment) (hoyer lift. wife reports willing to rent hospital bed  again)    Recommendations for Other Services Other (comment) (palliative- wife expressed open to discussion as pt really want to return home)     Precautions / Restrictions Precautions Precautions: Fall;Back Precaution Comments: clam shell SUPINE Required Braces or Orthoses: Spinal Brace Spinal Brace: Thoracolumbosacral orthotic;Applied in supine position      Mobility Bed Mobility Overal bed mobility: Needs Assistance Bed Mobility: Rolling Rolling: Max assist         General bed mobility comments: Rolling R and L to place brace. positioned TLSO prior to [facility] elevated in chair position. pt with TLSO too high into the cervical area    Transfers                 General transfer comment: NT due to decr BP    Balance                                           ADL either performed or assessed with clinical judgement   ADL Overall ADL's : Needs assistance/impaired   Eating/Feeding Details (indicate cue type and reason): RN reports poor PO intake today                                   General ADL Comments: pt will be total (A) for all  adls at this time. pt rolled R and L for TLSO positioning. family and RN notified if patient is flat that brace can be off to help with comfort. Reporting pt is more comfortable with brace one     Vision         Perception     Praxis      Pertinent Vitals/Pain Pain Assessment: Faces Faces Pain Scale: Hurts whole lot Pain Location: R hamstring area Pain Descriptors / Indicators: Grimacing;Discomfort;Moaning Pain Intervention(s): Monitored during session;Repositioned;Premedicated before session;Limited activity within patient's tolerance;Relaxation     Hand Dominance Right   Extremity/Trunk Assessment Upper Extremity Assessment Upper Extremity Assessment: Generalized weakness   Lower Extremity Assessment Lower Extremity Assessment: Defer to PT evaluation   Cervical / Trunk  Assessment Cervical / Trunk Assessment: Other exceptions (spinal fx) Cervical / Trunk Exceptions: T6 fx   Communication     Cognition Arousal/Alertness: Awake/alert Behavior During Therapy: Restless Overall Cognitive Status: History of cognitive impairments - at baseline                                 General Comments: wife answering all questions about PTA. pt only states Its still there. wife states "its so hard to tell because once he gets it on his mind he keeps at it even when i am trying to fix it becuase he forgets"   General Comments  BP decreased 86/58 (67) bed HOB elevated 72/50 (57) 78/43 (54) RR 20 95% o2 HR 83 Rn in room addressing BP with bolus running    Exercises     Shoulder Instructions      Home Living Family/patient expects to be discharged to:: Private residence Living Arrangements: Spouse/significant other Available Help at Discharge: Family;Available 24 hours/day Type of Home: House Home Access: Stairs to enter;Other (comment) (can have ramp put on) Entrance Stairs-Number of Steps: 3   Home Layout: One level     Bathroom Shower/Tub: Occupational psychologist: Handicapped height     Home Equipment: Environmental consultant - 2 wheels;Walker - 4 wheels;Walker - standard;Cane - single point;Crutches;Bedside commode;Shower seat;Hand held shower head;Wheelchair - manual;Electric scooter;Transport chair;Hospital bed;Other (comment) (lift chair)   Additional Comments: wife reports that she helps with all activities for patient but he walks himself using a upright walker      Prior Functioning/Environment Level of Independence: Needs assistance  Gait / Transfers Assistance Needed: uses upright walker in the house that is carpet (expect two rooms), pt uses RW to go to the car as it is easier for wife to lift. pt has transport chair and wheelchair that wife can use as well ADL's / Homemaking Assistance Needed: wife helps sponge bath   Comments: wife  reports son is failure to launch child and lives within the home with them ( pt and wife have been married for 20 years ( 2002) son is not his biological son)        OT Problem List: Impaired balance (sitting and/or standing);Decreased knowledge of use of DME or AE;Decreased activity tolerance;Cardiopulmonary status limiting activity;Pain;Decreased safety awareness;Decreased cognition;Decreased knowledge of precautions      OT Treatment/Interventions: Self-care/ADL training;Therapeutic exercise;Neuromuscular education;Energy conservation;DME and/or AE instruction;Manual therapy;Therapeutic activities;Cognitive remediation/compensation;Patient/family education;Balance training    OT Goals(Current goals can be found in the care plan section) Acute Rehab OT Goals Patient Stated Goal: to return home OT Goal Formulation: With patient/family Time For Goal Achievement: 10/14/20 Potential to Achieve Goals: Good  OT Frequency: Min 2X/week   Barriers to D/C:    wife is able to help with all task but can not physically (A).wife with breast CA 2020 so needs to be able to give more min guard to min (A)       Co-evaluation              AM-PAC OT "6 Clicks" Daily Activity     Outcome Measure Help from another person eating meals?: A Lot Help from another person taking care of personal grooming?: A Lot Help from another person toileting, which includes using toliet, bedpan, or urinal?: Total Help from another person bathing (including washing, rinsing, drying)?: Total Help from another person to put on and taking off regular upper body clothing?: Total Help from another person to put on and taking off regular lower body clothing?: Total 6 Click Score: 8   End of Session Equipment Utilized During Treatment: Back brace;Oxygen Nurse Communication: Mobility status;Precautions  Activity Tolerance: Patient tolerated treatment well Patient left: in bed;with call bell/phone within reach;with bed  alarm set;with SCD's reapplied;with family/visitor present;with nursing/sitter in room  OT Visit Diagnosis: Unsteadiness on feet (R26.81);Pain;Muscle weakness (generalized) (M62.81)                Time: 1440-1520 OT Time Calculation (min): 40 min Charges:  OT General Charges $OT Visit: 1 Visit OT Evaluation $OT Eval Moderate Complexity: 1 Mod OT Treatments $Self Care/Home Management : 8-22 mins   [place], OTR/L  Acute Rehabilitation Services Pager: [phone] Office: [phone] .   Jeri Modena 09/30/2020, 4:19 PM

## 2020-09-30 NOTE — Consult Note (Addendum)
   Providing Compassionate, Quality Care - Together  Neurosurgery Consult  Referring physician: Dr. Olevia Bowens Reason for referral: T6 fracture, subdural hygroma  Chief Complaint: Fall  History of Present Illness: This is a 87 year old male with a history of A. fib, hyperlipidemia, GERD, diabetes that complains of back pain and right hip pain after a fall at home.  He was noted to be slightly hypotensive in the emergency department is worked up for sepsis.  Evaluation revealed a T6 Chance fracture, MRI revealed a ventral epidural hematoma with some stenosis.  The patient denies any numbness, tingling or weakness in his legs.  He does complain of significant back pain with any movement.  He is wearing a TLSO brace.  His wife is at bedside with him.  She helped with the history as he is somewhat of a poor historian.  She states for that for the last 2 years she has had a poor quality of life and only transfers from his recliner chair to his wheelchair.    Medications: I have reviewed the patient's current medications. Allergies: No Known Allergies  History reviewed. No pertinent family history. Social History:  has no history on file for tobacco use, alcohol use, and drug use.  ROS: 14 point review of systems was obtained which all pertinent positive and negatives are listed in HPI above  Physical Exam:  Vital signs in last 24 hours: Temp:  [98 F (36.7 C)-98.3 F (36.8 C)] 98 F (36.7 C) (07/25 1814) Pulse Rate:  [58-128] 65 (07/26 0746) Resp:  [11-18] 14 (07/26 0217) BP: (138-182)/(65-125) 153/88 (07/26 0700) SpO2:  [91 %-98 %] 96 % (07/26 0746) PE: A&O x2 Mild distress TLSO brace on PERRLA Face symmetric Sensory intact to light touch BUE 4+/5 LLE 4+/5 RLE 4 -/5 Tenderness to palpation over the upper thoracic spine   Impression/Assessment:   87 year old male with  1.  Bilateral subdural hygromas, without mass-effect 2.  T6 Chance fracture with epidural hematoma  Plan:   -I had an extensive discussion with the patient and his wife at bedside about surgical intervention for stabilization/fixation of his fracture given the instability versus bracing.  Given his significant comorbidities, his age and his unfortunate lack of quality of life I believe surgical intervention would likely not be the best option at this point.  They are going to discuss amongst some selves about what their wants and needs are.  I will come visit with them again, I will have palliative care come visit the patient as well. -I will order a clamshell TLSO as he is quite uncomfortable with a verticaline TLSO -Continue pain control, bedrest until clamshell TLSO was obtained -No acute neurosurgical intervention at this time -Patient should not be up or out of bed until appropriate bracing is obtained.  He should be logrolled appropriately.   Thank you for allowing me to participate in this patient's care.  Please do not hesitate to call with questions or concerns.   Elwin Sleight, [facility] Cell: [phone]

## 2020-09-30 NOTE — Progress Notes (Signed)
Pharmacy Antibiotic Note  Victor Wallace is a 87 y.o. male admitted on 09/29/2020 with sepsis.  Pharmacy has been consulted for vancomycin and Zosyn dosing.  Plan: Vancomycin $RemoveBeforeDE'2000mg'eGeDLfzbxXOmTUN$  x1 then $Remov'1000mg'dHrKEm$  IV Q24H. Goal AUC 400-550.  Expected AUC 490.  SCr used 1.53.  Zosyn 3.375g IV Q8H (4-hour infusion).  Height: 6' (182.9 cm) Weight: 90 kg (198 lb 6.6 oz) IBW/kg (Calculated) : 77.6  Temp (24hrs), Avg:97.6 F (36.4 C), Min:97.4 F (36.3 C), Max:97.7 F (36.5 C)  Recent Labs  Lab 09/29/20 1417 09/29/20 1608  WBC 16.4*  --   CREATININE 1.53*  --   LATICACIDVEN  --  2.1*    Estimated Creatinine Clearance: 32.4 mL/min (A) (by C-G formula based on SCr of 1.53 mg/dL (H)).    Allergies  Allergen Reactions  . Shirley Friar [Lifitegrast] Other (See Comments)    Made the eyes burn    Thank you for allowing pharmacy to be a part of this patient's care.  Wynona Neat, PharmD, BCPS  09/30/2020 3:49 AM

## 2020-09-30 NOTE — Progress Notes (Signed)
PT Cancellation Note  Patient Details Name: Victor Wallace MRN: [id number] DOB: 08/05/1924   Cancelled Treatment:    Reason Eval/Treat Not Completed: Medical issues which prohibited therapy. Attempted eval again this afternoon but OT in the room reporting his BP dropped significantly with just the [facility] being elevated and therefore would be unsafe to try to get out of bed. Will follow-up another day as appropriate.  Moishe Spice, PT, DPT Acute Rehabilitation Services  Pager: [phone] Office: [place] 09/30/2020, 5:13 PM

## 2020-09-30 NOTE — Progress Notes (Signed)
PROGRESS NOTE    Victor Wallace  ZJQ:[id number] DOB: February 18, 1925 DOA: 09/29/2020 PCP: Haywood Pao, MD    Brief Narrative:  87 y/o male admitted to the hospital after a fall, and found to have an acute sacral fracture and acute T6 compression fracture with large epidural hematoma. Seen by neurosurgery and nonoperative management recommended. Pain is currently uncontrolled. Palliative care consulted to help address goals of care.   Assessment & Plan:   Principal Problem:   Thoracic compression fracture (HCC) Active Problems:   Hyperlipidemia   Type 2 diabetes mellitus with renal manifestations, controlled (HCC)   Hypotension   Sacral fracture (HCC)   Acute T6 compression fracture with large epidural hematoma -secondary to unwitnessed fall -seen by neurosurgery -non surgical management recommended with pain control, PT and TLSO brace -family in agreement to pursue non operative approach  Acute minimally displaced right sacral fracture.  -secondary to fall -images reviewed with Dr. Erlinda Hong, ortho, who recommended non operative management -WBAT, PT/OT  Hypotension, mild lactic acidosis -no clear source of infection -WBC count elevated -possibly stress response -continue on IV fluids -blood cultures sent -he is on empiric antibiotics for now -will discontinue in 24 hours if no signs of infection develop  CKD stage 3 -creatinine at baseline -continue to monitor  Paroxysmal A fib -continue metoprolol for rate control -hold anticoagulation with epidural hematoma -per wife, patient has not taking any anticoagulation in the last 3 months  DM2, insulin dependent -continued on basal insulin -continue on SSI -blood sugars elevated -change SSI to resistant -A1c 8.4  Pulmonary nodule -noted on CT, can consider follow up if desired  Bilateral subdural hygromas -likely chronic subdural hematomas -no midline shift  Goals of care -will consult palliative care to help  with discussions around goals of care and plans moving forward -patient appears to be a reasonable candidate for hospice -will also ask for assistance with pain management.   DVT prophylaxis: Place and maintain sequential compression device Start: 09/29/20 2128  Code Status: DNR Family Communication: discussed with wife at the bedside Disposition Plan: Status is: Observation  The patient will require care spanning > 2 midnights and should be moved to inpatient because: Ongoing active pain requiring inpatient pain management  Dispo: The patient is from: Home              Anticipated d/c is to: TBD              Patient currently is not medically stable to d/c.   Difficult to place patient No   Consultants:   neurosurgery  Procedures:     Antimicrobials:    vancomycin 3/31>  Zosyn 3/31>   Subjective: Reports continued pain in right hip  Objective: Vitals:   09/30/20 0030 09/30/20 0200 09/30/20 0400 09/30/20 0711  BP: (!) 148/76 (!) 161/72 127/67 (!) 154/86  Pulse: 100 (!) 103 96 93  Resp: (!) 32 $Remo'20 17 19  'HtJiI$ Temp:  (!) 97.4 F (36.3 C) 98.6 F (37 C) 97.6 F (36.4 C)  TempSrc:  Oral Oral Oral  SpO2: 97% 98% 99% 98%  Weight:      Height:        Intake/Output Summary (Last 24 hours) at 09/30/2020 1127 Last data filed at 09/30/2020 0500 Gross per 24 hour  Intake 2500 ml  Output --  Net 2500 ml   Filed Weights   09/29/20 1341  Weight: 90 kg    Examination:  General exam: Appears uncomfortable due to pain Respiratory  system: Clear to auscultation. Respiratory effort normal. Cardiovascular system: S1 & S2 heard, RRR. No JVD, murmurs, rubs, gallops or clicks. No pedal edema. Gastrointestinal system: Abdomen is nondistended, soft and nontender. No organomegaly or masses felt. Normal bowel sounds heard. Central nervous system:  No focal neurological deficits. Extremities: Symmetric 5 x 5 power. Skin: No rashes, lesions or ulcers Psychiatry: agitated,  uncomfortable    Data Reviewed: I have personally reviewed following labs and imaging studies  CBC: Recent Labs  Lab 09/29/20 1417 09/30/20 0521  WBC 16.4* 19.5*  NEUTROABS 13.6*  --   HGB 13.4 12.8*  HCT 42.5 40.0  MCV 99.3 97.3  PLT 159 034*   Basic Metabolic Panel: Recent Labs  Lab 09/29/20 1417 09/30/20 0500  NA 140 140  K 4.1 4.9  CL 108 109  CO2 25 21*  GLUCOSE 165* 341*  BUN 28* 32*  CREATININE 1.53* 1.84*  CALCIUM 9.1 8.8*   GFR: Estimated Creatinine Clearance: 26.9 mL/min (A) (by C-G formula based on SCr of 1.84 mg/dL (H)). Liver Function Tests: No results for input(s): AST, ALT, ALKPHOS, BILITOT, PROT, ALBUMIN in the last 168 hours. No results for input(s): LIPASE, AMYLASE in the last 168 hours. No results for input(s): AMMONIA in the last 168 hours. Coagulation Profile: No results for input(s): INR, PROTIME in the last 168 hours. Cardiac Enzymes: No results for input(s): CKTOTAL, CKMB, CKMBINDEX, TROPONINI in the last 168 hours. BNP (last 3 results) No results for input(s): PROBNP in the last 8760 hours. HbA1C: Recent Labs    09/29/20 2330  HGBA1C 8.4*   CBG: Recent Labs  Lab 09/30/20 0345 09/30/20 0742  GLUCAP 332* 333*   Lipid Profile: No results for input(s): CHOL, HDL, LDLCALC, TRIG, CHOLHDL, LDLDIRECT in the last 72 hours. Thyroid Function Tests: No results for input(s): TSH, T4TOTAL, FREET4, T3FREE, THYROIDAB in the last 72 hours. Anemia Panel: No results for input(s): VITAMINB12, FOLATE, FERRITIN, TIBC, IRON, RETICCTPCT in the last 72 hours. Sepsis Labs: Recent Labs  Lab 09/29/20 1608 09/29/20 2330 09/30/20 0521  PROCALCITON  --  0.56  --   LATICACIDVEN 2.1*  --  2.7*    Recent Results (from the past 240 hour(s))  Resp Panel by RT-PCR (Flu A&B, Covid) Nasopharyngeal Swab     Status: None   Collection Time: 09/29/20  8:46 PM   Specimen: Nasopharyngeal Swab; Nasopharyngeal(NP) swabs in vial transport medium  Result Value Ref  Range Status   SARS Coronavirus 2 by RT PCR NEGATIVE NEGATIVE Final    Comment: (NOTE) SARS-CoV-2 target nucleic acids are NOT DETECTED.  The SARS-CoV-2 RNA is generally detectable in upper respiratory specimens during the acute phase of infection. The lowest concentration of SARS-CoV-2 viral copies this assay can detect is 138 copies/mL. A negative result does not preclude SARS-Cov-2 infection and should not be used as the sole basis for treatment or other patient management decisions. A negative result may occur with  improper specimen collection/handling, submission of specimen other than nasopharyngeal swab, presence of viral mutation(s) within the areas targeted by this assay, and inadequate number of viral copies(<138 copies/mL). A negative result must be combined with clinical observations, patient history, and epidemiological information. The expected result is Negative.  Fact Sheet for Patients:  EntrepreneurPulse.com.au  Fact Sheet for Healthcare Providers:  IncredibleEmployment.be  This test is no t yet approved or cleared by the [place] FDA and  has been authorized for detection and/or diagnosis of SARS-CoV-2 by FDA under an Emergency Use Authorization (EUA).  This EUA will remain  in effect (meaning this test can be used) for the duration of the COVID-19 declaration under Section 564(b)(1) of the Act, 21 U.S.C.section 360bbb-3(b)(1), unless the authorization is terminated  or revoked sooner.       Influenza A by PCR NEGATIVE NEGATIVE Final   Influenza B by PCR NEGATIVE NEGATIVE Final    Comment: (NOTE) The Xpert Xpress SARS-CoV-2/FLU/RSV plus assay is intended as an aid in the diagnosis of influenza from Nasopharyngeal swab specimens and should not be used as a sole basis for treatment. Nasal washings and aspirates are unacceptable for Xpert Xpress SARS-CoV-2/FLU/RSV testing.  Fact Sheet for  Patients: EntrepreneurPulse.com.au  Fact Sheet for Healthcare Providers: IncredibleEmployment.be  This test is not yet approved or cleared by the [place] FDA and has been authorized for detection and/or diagnosis of SARS-CoV-2 by FDA under an Emergency Use Authorization (EUA). This EUA will remain in effect (meaning this test can be used) for the duration of the COVID-19 declaration under Section 564(b)(1) of the Act, 21 U.S.C. section 360bbb-3(b)(1), unless the authorization is terminated or revoked.  Performed at [facility], [place] [address], [place], [place] [address]   MRSA PCR Screening     Status: None   Collection Time: 09/30/20  2:21 AM   Specimen: Nasal Mucosa; Nasopharyngeal  Result Value Ref Range Status   MRSA by PCR NEGATIVE NEGATIVE Final    Comment:        The GeneXpert MRSA Assay (FDA approved for NASAL specimens only), is one component of a comprehensive MRSA colonization surveillance program. It is not intended to diagnose MRSA infection nor to guide or monitor treatment for MRSA infections. Performed at [facility], [place] [address], [place], [place] [address]          Radiology Studies: DG Chest 1 View  Result Date: 09/29/2020 CLINICAL DATA:  Loss of balance, fall, hip pain EXAM: CHEST  1 VIEW COMPARISON:  08/10/2019 FINDINGS: The heart size and mediastinal contours are within normal limits. Both lungs are clear. The visualized skeletal structures are unremarkable. IMPRESSION: No active disease. Electronically Signed   By: Randa Ngo M.D.   On: 09/29/2020 15:20   CT Head Wo Contrast  Result Date: 09/29/2020 CLINICAL DATA:  Loss of balance, fell, hip pain EXAM: CT HEAD WITHOUT CONTRAST TECHNIQUE: Contiguous axial images were obtained from the base of the skull through the vertex without intravenous contrast. COMPARISON:  01/31/2012 FINDINGS: Brain: Hypodensities within the  periventricular white matter are most consistent with chronic small vessel ischemic changes. No acute infarct or hemorrhage. Lateral ventricles and remaining midline structures are unremarkable. There are no acute extra-axial fluid collections. CSF attenuation fluid along the cerebral convexities measure up to 0.7 cm on the right and 0.9 cm on the left, consistent with subdural hygromas or chronic subdural hematomas. These are new since prior study. No mass effect. Vascular: No hyperdense vessel or unexpected calcification. Skull: Normal. Negative for fracture or focal lesion. Sinuses/Orbits: Mild polypoid mucosal thickening within the bilateral maxillary sinuses unchanged. Remaining sinuses are clear. Other: None. IMPRESSION: 1. Extra-axial CSF attenuation collections along the bilateral convexities, consistent with subdural hygromas or chronic subdural hematoma. No mass effect. 2. No acute intracranial hemorrhage. 3. Chronic small vessel ischemic changes within the white matter. No acute infarct. Electronically Signed   By: Randa Ngo M.D.   On: 09/29/2020 18:38   CT Cervical Spine Wo Contrast  Result Date: 09/29/2020 CLINICAL DATA:  [place], loss  of balance EXAM: CT CERVICAL SPINE WITHOUT CONTRAST TECHNIQUE: Multidetector CT imaging of the cervical spine was performed without intravenous contrast. Multiplanar CT image reconstructions were also generated. COMPARISON:  None. FINDINGS: Alignment: Right convex scoliosis centered at C5. Otherwise alignment is anatomic. Skull base and vertebrae: No acute fracture. No primary bone lesion or focal pathologic process. Soft tissues and spinal canal: No prevertebral fluid or swelling. No visible canal hematoma. 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 Prominent spondylosis at C3-4, C5-6, and C6-7. Mild diffuse bilateral facet hypertrophy. Neural foraminal narrowing most pronounced at C3-4 and C5-6. Upper chest: Airway is  patent.  Lung apices are clear. Other: Reconstructed images demonstrate no additional findings. IMPRESSION: 1. No acute cervical spine fracture. 2. Extensive multilevel cervical spondylosis and facet hypertrophy. Electronically Signed   By: Randa Ngo M.D.   On: 09/29/2020 18:40   CT Thoracic Spine Wo Contrast  Result Date: 09/29/2020 CLINICAL DATA:  Ground level fall at home using walker in the bathroom. Lost balance leading to fall. Back pain. EXAM: CT THORACIC SPINE WITHOUT CONTRAST TECHNIQUE: Multidetector CT images of the thoracic were obtained using the standard protocol without intravenous contrast. COMPARISON:  No prior thoracic spine imaging. Chest radiograph 05/16/2017 reviewed FINDINGS: Alignment: Exaggerated upper thoracic kyphosis. Vertebrae: Acute T6 compression fracture. 40% loss of height anteriorly. There is posterior cortex involvement with cortical buckling of bony retropulsion of 2 mm. There is a nondisplaced fracture component that involves the spinous process of T6. No convincing pedicle or laminar involvement. No transverse process involvement. No other acute fracture. Occasional Schmorl's nodes as well as multilevel anterior spurring. Paraspinal and other soft tissues: 4 mm left upper lobe pulmonary nodule, series 3, image 40. Hypoventilatory changes in the dependent lungs. Calcified right hilar lymph nodes consistent with prior granulomatous disease. No acute fracture of included ribs. Disc levels: There is mild bony canal narrowing at T6 related to mild bony retropulsion. No other canal narrowing. Minor disc space narrowing in the lower thoracic spine with anterior spurring. IMPRESSION: 1. Acute T6 compression fracture with 40% loss of height anteriorly and 2 mm bony retropulsion. Mild bony canal narrowing at T6 related to bony retropulsion. Nondisplaced fracture extends through the spinous process of T6. 2. No other acute fracture of the thoracic spine. 3. Incidental note of a left  upper lobe pulmonary nodule measuring 46mm. No prior exams available for comparison. Follow-up recommendation for a nodule of this size as follows, giving consideration to patient's advanced age: No follow-up needed if patient is low-risk. Non-contrast chest CT can be considered in 12 months if patient is high-risk. This recommendation follows the consensus statement: Guidelines for Management of Incidental Pulmonary Nodules Detected on CT Images: From the Fleischner Society 2017; Radiology 2017; 284:228-243. Electronically Signed   By: Keith Rake M.D.   On: 09/29/2020 18:43   MR THORACIC SPINE WO CONTRAST  Result Date: 09/29/2020 CLINICAL DATA:  Back pain EXAM: MRI THORACIC SPINE WITHOUT CONTRAST TECHNIQUE: Multiplanar, multisequence MR imaging of the thoracic spine was performed. No intravenous contrast was administered. COMPARISON:  None. FINDINGS: Truncated examination as patient refused postcontrast imaging. Seven series provided. Alignment: Normal Vertebrae: There is an acute compression fracture of T6 with approximately 75% anterior height loss. There is retropulsion of 3 mm. Cord: Normal signal and morphology. There is a ventral epidural collection extending from T3-T12. Paraspinal and other soft tissues: Negative. Disc levels: Mild spinal canal narrowing at the T6 level due to  a slight retropulsion of T6. IMPRESSION: 1. Truncated examination as patient refused postcontrast imaging. 2. Acute compression fracture of T6 with approximately 75% anterior height loss and 3 mm retropulsion. 3. Ventral epidural collection extending from T3-T12, favored to be an epidural hematoma, particularly as the patient is on anticoagulation therapy. This narrows the thecal sac and exerts mass effect on the spinal cord. Critical Value/emergent results were called by telephone at the time of interpretation on 09/29/2020 at 11:12 pm to provider [facility] , who verbally acknowledged these results. Electronically  Signed   By: Ulyses Jarred M.D.   On: 09/29/2020 23:12   CT L-SPINE NO CHARGE  Result Date: 09/29/2020 CLINICAL DATA:  Ground level fall at home all using walker in the bathroom. Lost balance leading to fall. Low back pain. EXAM: CT LUMBAR SPINE WITHOUT CONTRAST TECHNIQUE: Multidetector CT imaging of the lumbar spine was performed without intravenous contrast administration. Multiplanar CT image reconstructions were also generated. COMPARISON:  Reformats from abdominopelvic CT 02/21/2015 FINDINGS: Segmentation: 5 lumbar type vertebrae. Alignment: Normal. Vertebrae: Mild L1 superior endplate compression fracture is chronic. There is a prominent Schmorl's node involving L4 that is chronic. Undulation of inferior L5 endplate is chronic. There is no evidence of acute lumbar fracture. There is an acute fractures through right aspect of the sacrum extending through the sacral foramen, zone 2 fracture. Paraspinal and other soft tissues: Assistant concurrent abdominopelvic CT, reported separately. Disc levels: Minor L5-S1 and L1-L2 disc space narrowing. There is facet hypertrophy at L3-L4 and L5-S1. IMPRESSION: 1. Acute minimally displaced 7 2 right sacral fracture extending through the sacral foramen. 2. No evidence of acute lumbar fracture. 3. Chronic mild L1 compression fracture, unchanged from 2016. Electronically Signed   By: Keith Rake M.D.   On: 09/29/2020 18:49   CT Renal Stone Study  Result Date: 09/29/2020 CLINICAL DATA:  Flank pain.  Kidney stone suspected. EXAM: CT ABDOMEN AND PELVIS WITHOUT CONTRAST TECHNIQUE: Multidetector CT imaging of the abdomen and pelvis was performed following the standard protocol without IV contrast. COMPARISON:  February 21, 2015 FINDINGS: Lower chest: Mild dependent airspace consolidation versus atelectasis, right greater than left. Hepatobiliary: No focal liver abnormality is seen. No gallstones, gallbladder wall thickening, or biliary dilatation. Pancreas: Unremarkable.  No pancreatic ductal dilatation or surrounding inflammatory changes. Spleen: Scattered splenic granulomata 1.6 cm circumscribed hypoattenuated lesion in the anterior aspect of the spleen. Adrenals/Urinary Tract: Normal adrenal glands. No evidence of hydronephrosis or obstructive uropathy. 3 mm nonobstructive calculus in the lower pole of the right kidney. 2.7 x 2.1 cm solid mass within the mid polar medial region of the right kidney. 5.3 cm rim calcified exophytic left renal cyst off of the mid polar region of the kidney. Stomach/Bowel: Stomach is within normal limits. No evidence of bowel wall thickening, distention, or inflammatory changes. Vascular/Lymphatic: Aortic atherosclerosis. No enlarged abdominal or pelvic lymph nodes. Reproductive: Nonspecific coarse calcifications of the prostate gland. Other: No abdominal wall hernia or abnormality. No abdominopelvic ascites. Musculoskeletal: Right total hip arthroplasty. No evidence of acute fractures. IMPRESSION: 1. 2.7 cm indeterminate density mass within the mid polar medial region of the right kidney, not significantly changed from 2016 2. No evidence of obstructive uropathy. 3 mm nonobstructive right lower pole renal calculus. 3. 5.3 cm rim calcified exophytic left renal cyst, stable from 2016. 4. 1.6 cm circumscribed hypoattenuated lesion in the anterior aspect of the spleen, nonspecific. 5. Mild dependent airspace consolidation versus atelectasis, right greater than left. Aortic Atherosclerosis (ICD10-I70.0). Electronically Signed  By: Fidela Salisbury M.D.   On: 09/29/2020 18:49   DG Hip Unilat With Pelvis 2-3 Views Right  Result Date: 09/29/2020 CLINICAL DATA:  [place], right hip pain EXAM: DG HIP (WITH OR WITHOUT PELVIS) 2-3V RIGHT COMPARISON:  None. FINDINGS: Frontal view of the pelvis as well as frontal and cross-table lateral views of the right hip are obtained. The distal margin of the femoral component of the right hip arthroplasty is excluded by  collimation on the lateral view. Bones are diffusely osteopenic. There is a right hip hemiarthroplasty, with long stem femoral component and cerclage wires. Alignment is anatomic. There are no acute displaced fractures. Heterotopic ossification surrounds the right hip arthroplasty. Mild left hip osteoarthritis. Sacroiliac joints are normal. IMPRESSION: 1. No acute displaced pelvic fracture. 2. Unremarkable right hip arthroplasty. Portions of the distal margin of the femoral component of the arthroplasty are excluded by collimation on the lateral view. Electronically Signed   By: Randa Ngo M.D.   On: 09/29/2020 15:19        Scheduled Meds: . atorvastatin  40 mg Oral Daily  . cycloSPORINE  2 drop Both Eyes BID  . docusate sodium  100 mg Oral Daily  . insulin aspart  0-5 Units Subcutaneous QHS  . insulin aspart  0-9 Units Subcutaneous TID WC  . insulin detemir  14 Units Subcutaneous QHS  . isosorbide mononitrate  30 mg Oral q morning  . metoprolol succinate  25 mg Oral Daily  . pantoprazole  40 mg Oral Daily  . timolol  1 drop Left Eye BID   Continuous Infusions: . lactated ringers    . piperacillin-tazobactam (ZOSYN)  IV 3.375 g (09/30/20 1019)  . [START ON 10/01/2020] vancomycin       LOS: 0 days    Time spent: 58mins    Kathie Dike, MD [facility]   If 7PM-7AM, please contact night-coverage [email or web address]  09/30/2020, 11:27 AM

## 2020-09-30 NOTE — Progress Notes (Signed)
I met with the patient and his wife again at bedside.  At this time he was being fitted for his clamshell TLSO which appears more comfortable to him.  We agree that nonsurgical intervention is the best option for him given his age and significant comorbidities.  We went over expected outcomes of him wearing the brace and his fracture healing.  She understands that there is significant social challenges and caretaking issues of her taking care of him at home.  Palliative is been consulted for further discussion as well.  I do not recommend any neurosurgical intervention at this time.  I also explained with the wife the findings of the subdural hygromas which are insignificant and likely an incidental finding.  She understands.  I answered all their questions.  We will sign off at this time, please call with any further questions or concerns.   Thank you for allowing me to participate in this patient's care.  Please do not hesitate to call with questions or concerns.   Elwin Sleight, [facility] Cell: [phone]

## 2020-09-30 NOTE — Progress Notes (Signed)
Orthopedic Tech Progress Note Patient Details:  Victor Wallace 1924/12/23 [id number] Custom Clamshell TLSO has been ordered  Patient ID: Victor Wallace, male   DOB: 1924/11/12, 87 y.o.   MRN: [id number]   Victor Wallace Victor Wallace 09/30/2020, 6:01 AM

## 2020-09-30 NOTE — ED Notes (Signed)
ED TO INPATIENT HANDOFF REPORT  Name/Age/Gender Victor Wallace 87 y.o. male  Code Status    Code Status Orders  (From admission, onward)         Start     Ordered   09/29/20 2127  Do not attempt resuscitation (DNR)  Continuous       Question Answer Comment  In the event of cardiac or respiratory ARREST Do not call a "code blue"   In the event of cardiac or respiratory ARREST Do not perform Intubation, CPR, defibrillation or ACLS   In the event of cardiac or respiratory ARREST Use medication by any route, position, wound care, and other measures to relive pain and suffering. May use oxygen, suction and manual treatment of airway obstruction as needed for comfort.      09/29/20 2126        Code Status History    Date Active Date Inactive Code Status Order ID Comments User Context   09/29/2020 2048 09/29/2020 2126 Full Code [id number]  John Giovanni, MD ED   04/28/2015 2112 04/30/2015 1905 Full Code [id number]  Eston Esters, MD Inpatient   08/29/2014 1942 08/31/2014 1543 Full Code [id number]  Othella Boyer, MD ED   03/04/2013 0020 03/09/2013 1342 Full Code [id number]  Alysia Penna, MD Inpatient   01/31/2012 1946 02/05/2012 1739 Full Code [id number]  Theodis Blaze, RN Inpatient   Advance Care Planning Activity    Advance Directive Documentation   Flowsheet Row Most Recent Value  Type of Advance Directive Healthcare Power of Attorney, Living will  Pre-existing out of facility DNR order (yellow form or pink MOST form) --  "MOST" Form in Place? --      Home/SNF/Other Home  Chief Complaint Thoracic compression fracture (HCC) [S22.000A]  Level of Care/Admitting Diagnosis ED Disposition    ED Disposition Condition Comment   Admit  Hospital Area: [facility] [[id number]]  Level of Care: Telemetry Medical [104]  I expect the patient will be discharged within 24 hours: No (not a candidate for 5C-Observation unit)  Covid Evaluation: Confirmed COVID Negative   Diagnosis: Thoracic compression fracture [facility]) [[id number]]  Admitting Physician: John Giovanni [[id number]]  Attending Physician: John Giovanni [[id number]]       Medical History Past Medical History:  Diagnosis Date  . CAD (coronary artery disease), native coronary artery    Catheterization 2008 showing moderate nonobstructive disease in LAD, circumflex and right coronary artery, no lesions greater than 40%.  EF of 35% at that time.   Marland Kitchen GERD (gastroesophageal reflux disease)   . History of kidney stones   . Hypercholesterolemia   . Hyperlipidemia 12/02/2008  . Hypertension   . Kidney disease, chronic, stage III (GFR 30-59 ml/min) (HCC) 08/29/2014  . Paroxysmal atrial fibrillation (HCC) 08/29/2014   Chads2VASC score 5   . Transient global amnesia 2008   . Type 2 diabetes mellitus with renal manifestations, controlled (HCC)     Allergies Allergies  Allergen Reactions  . Benay Spice [Lifitegrast] Other (See Comments)    Made the eyes burn    IV Location/Drains/Wounds Patient Lines/Drains/Airways Status    Active Line/Drains/Airways    Name Placement date Placement time Site Days   Peripheral IV 02/22/16 Right Antecubital 02/22/16  0228  Antecubital  1682   Peripheral IV 05/16/17 Right Hand 05/16/17  2041  Hand  1233   Peripheral IV 08/10/19 Left Antecubital 08/10/19  1908  Antecubital  417   Peripheral IV 09/29/20 Right Antecubital 09/29/20  1315  Antecubital  1   Peripheral IV 09/29/20 Left Antecubital 09/29/20  2332  Antecubital  1   Ureteral Drain/Stent Left ureter 6 Fr. 03/06/13  1640  Left ureter  2765          Labs/Imaging Results for orders placed or performed during the hospital encounter of 09/29/20 (from the past 48 hour(s))  Basic metabolic panel     Status: Abnormal   Collection Time: 09/29/20  2:17 PM  Result Value Ref Range   Sodium 140 135 - 145 mmol/L   Potassium 4.1 3.5 - 5.1 mmol/L   Chloride 108 98 - 111 mmol/L   CO2 25 22 - 32 mmol/L   Glucose, Bld 165  (H) 70 - 99 mg/dL    Comment: Glucose reference range applies only to samples taken after fasting for at least 8 hours.   BUN 28 (H) 8 - 23 mg/dL   Creatinine, Ser 1.53 (H) 0.61 - 1.24 mg/dL   Calcium 9.1 8.9 - 10.3 mg/dL   GFR, Estimated 42 (L) >60 mL/min    Comment: (NOTE) Calculated using the CKD-EPI Creatinine Equation (2021)    Anion gap 7 5 - 15    Comment: Performed at [facility], [place] [address] [place], [place] [address]  CBC with Differential     Status: Abnormal   Collection Time: 09/29/20  2:17 PM  Result Value Ref Range   WBC 16.4 (H) 4.0 - 10.5 K/uL   RBC 4.28 4.22 - 5.81 MIL/uL   Hemoglobin 13.4 13.0 - 17.0 g/dL   HCT 42.5 39.0 - 52.0 %   MCV 99.3 80.0 - 100.0 fL   MCH 31.3 26.0 - 34.0 pg   MCHC 31.5 30.0 - 36.0 g/dL   RDW 13.9 11.5 - 15.5 %   Platelets 159 150 - 400 K/uL   nRBC 0.0 0.0 - 0.2 %   Neutrophils Relative % 83 %   Neutro Abs 13.6 (H) 1.7 - 7.7 K/uL   Lymphocytes Relative 10 %   Lymphs Abs 1.7 0.7 - 4.0 K/uL   Monocytes Relative 5 %   Monocytes Absolute 0.8 0.1 - 1.0 K/uL   Eosinophils Relative 1 %   Eosinophils Absolute 0.1 0.0 - 0.5 K/uL   Basophils Relative 0 %   Basophils Absolute 0.1 0.0 - 0.1 K/uL   Immature Granulocytes 1 %   Abs Immature Granulocytes 0.16 (H) 0.00 - 0.07 K/uL    Comment: Performed at [facility], [place] [address], [place], [place] [address]  Lactic acid, plasma     Status: Abnormal   Collection Time: 09/29/20  4:08 PM  Result Value Ref Range   Lactic Acid, Venous 2.1 (HH) 0.5 - 1.9 mmol/L    Comment: CRITICAL RESULT CALLED TO, READ BACK BY AND VERIFIED WITH: T.HOLT, RN AT 1744 ON 03.31.22 BY N.THOMPSON Performed at [facility], [place] [address], [place], [place] [address]   Resp Panel by RT-PCR (Flu A&B, Covid) Nasopharyngeal Swab     Status: None   Collection Time: 09/29/20  8:46 PM   Specimen: Nasopharyngeal Swab; Nasopharyngeal(NP) swabs in vial transport  medium  Result Value Ref Range   SARS Coronavirus 2 by RT PCR NEGATIVE NEGATIVE    Comment: (NOTE) SARS-CoV-2 target nucleic acids are NOT DETECTED.  The SARS-CoV-2 RNA is generally detectable in upper respiratory specimens during the acute phase of infection. The lowest concentration of SARS-CoV-2 viral copies this assay can detect is 138 copies/mL. A negative result does not  preclude SARS-Cov-2 infection and should not be used as the sole basis for treatment or other patient management decisions. A negative result may occur with  improper specimen collection/handling, submission of specimen other than nasopharyngeal swab, presence of viral mutation(s) within the areas targeted by this assay, and inadequate number of viral copies(<138 copies/mL). A negative result must be combined with clinical observations, patient history, and epidemiological information. The expected result is Negative.  Fact Sheet for Patients:  EntrepreneurPulse.com.au  Fact Sheet for Healthcare Providers:  IncredibleEmployment.be  This test is no t yet approved or cleared by the [place] FDA and  has been authorized for detection and/or diagnosis of SARS-CoV-2 by FDA under an Emergency Use Authorization (EUA). This EUA will remain  in effect (meaning this test can be used) for the duration of the COVID-19 declaration under Section 564(b)(1) of the Act, 21 U.S.C.section 360bbb-3(b)(1), unless the authorization is terminated  or revoked sooner.       Influenza A by PCR NEGATIVE NEGATIVE   Influenza B by PCR NEGATIVE NEGATIVE    Comment: (NOTE) The Xpert Xpress SARS-CoV-2/FLU/RSV plus assay is intended as an aid in the diagnosis of influenza from Nasopharyngeal swab specimens and should not be used as a sole basis for treatment. Nasal washings and aspirates are unacceptable for Xpert Xpress SARS-CoV-2/FLU/RSV testing.  Fact Sheet for  Patients: EntrepreneurPulse.com.au  Fact Sheet for Healthcare Providers: IncredibleEmployment.be  This test is not yet approved or cleared by the [place] FDA and has been authorized for detection and/or diagnosis of SARS-CoV-2 by FDA under an Emergency Use Authorization (EUA). This EUA will remain in effect (meaning this test can be used) for the duration of the COVID-19 declaration under Section 564(b)(1) of the Act, 21 U.S.C. section 360bbb-3(b)(1), unless the authorization is terminated or revoked.  Performed at [facility], [place] [address] [place], [place] [address]    DG Chest 1 View  Result Date: 09/29/2020 CLINICAL DATA:  Loss of balance, fall, hip pain EXAM: CHEST  1 VIEW COMPARISON:  08/10/2019 FINDINGS: The heart size and mediastinal contours are within normal limits. Both lungs are clear. The visualized skeletal structures are unremarkable. IMPRESSION: No active disease. Electronically Signed   By: Randa Ngo M.D.   On: 09/29/2020 15:20   CT Head Wo Contrast  Result Date: 09/29/2020 CLINICAL DATA:  Loss of balance, fell, hip pain EXAM: CT HEAD WITHOUT CONTRAST TECHNIQUE: Contiguous axial images were obtained from the base of the skull through the vertex without intravenous contrast. COMPARISON:  01/31/2012 FINDINGS: Brain: Hypodensities within the periventricular white matter are most consistent with chronic small vessel ischemic changes. No acute infarct or hemorrhage. Lateral ventricles and remaining midline structures are unremarkable. There are no acute extra-axial fluid collections. CSF attenuation fluid along the cerebral convexities measure up to 0.7 cm on the right and 0.9 cm on the left, consistent with subdural hygromas or chronic subdural hematomas. These are new since prior study. No mass effect. Vascular: No hyperdense vessel or unexpected calcification. Skull: Normal. Negative for fracture or focal  lesion. Sinuses/Orbits: Mild polypoid mucosal thickening within the bilateral maxillary sinuses unchanged. Remaining sinuses are clear. Other: None. IMPRESSION: 1. Extra-axial CSF attenuation collections along the bilateral convexities, consistent with subdural hygromas or chronic subdural hematoma. No mass effect. 2. No acute intracranial hemorrhage. 3. Chronic small vessel ischemic changes within the white matter. No acute infarct. Electronically Signed   By: Randa Ngo M.D.   On: 09/29/2020 18:38   CT Cervical  Spine Wo Contrast  Result Date: 09/29/2020 CLINICAL DATA:  [place], loss of balance EXAM: CT CERVICAL SPINE WITHOUT CONTRAST TECHNIQUE: Multidetector CT imaging of the cervical spine was performed without intravenous contrast. Multiplanar CT image reconstructions were also generated. COMPARISON:  None. FINDINGS: Alignment: Right convex scoliosis centered at C5. Otherwise alignment is anatomic. Skull base and vertebrae: No acute fracture. No primary bone lesion or focal pathologic process. Soft tissues and spinal canal: No prevertebral fluid or swelling. No visible canal hematoma. 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 Prominent spondylosis at C3-4, C5-6, and C6-7. Mild diffuse bilateral facet hypertrophy. Neural foraminal narrowing most pronounced at C3-4 and C5-6. Upper chest: Airway is patent.  Lung apices are clear. Other: Reconstructed images demonstrate no additional findings. IMPRESSION: 1. No acute cervical spine fracture. 2. Extensive multilevel cervical spondylosis and facet hypertrophy. Electronically Signed   By: Randa Ngo M.D.   On: 09/29/2020 18:40   CT Thoracic Spine Wo Contrast  Result Date: 09/29/2020 CLINICAL DATA:  Ground level fall at home using walker in the bathroom. Lost balance leading to fall. Back pain. EXAM: CT THORACIC SPINE WITHOUT CONTRAST TECHNIQUE: Multidetector CT images of the thoracic were obtained using the  standard protocol without intravenous contrast. COMPARISON:  No prior thoracic spine imaging. Chest radiograph 05/16/2017 reviewed FINDINGS: Alignment: Exaggerated upper thoracic kyphosis. Vertebrae: Acute T6 compression fracture. 40% loss of height anteriorly. There is posterior cortex involvement with cortical buckling of bony retropulsion of 2 mm. There is a nondisplaced fracture component that involves the spinous process of T6. No convincing pedicle or laminar involvement. No transverse process involvement. No other acute fracture. Occasional Schmorl's nodes as well as multilevel anterior spurring. Paraspinal and other soft tissues: 4 mm left upper lobe pulmonary nodule, series 3, image 40. Hypoventilatory changes in the dependent lungs. Calcified right hilar lymph nodes consistent with prior granulomatous disease. No acute fracture of included ribs. Disc levels: There is mild bony canal narrowing at T6 related to mild bony retropulsion. No other canal narrowing. Minor disc space narrowing in the lower thoracic spine with anterior spurring. IMPRESSION: 1. Acute T6 compression fracture with 40% loss of height anteriorly and 2 mm bony retropulsion. Mild bony canal narrowing at T6 related to bony retropulsion. Nondisplaced fracture extends through the spinous process of T6. 2. No other acute fracture of the thoracic spine. 3. Incidental note of a left upper lobe pulmonary nodule measuring 68mm. No prior exams available for comparison. Follow-up recommendation for a nodule of this size as follows, giving consideration to patient's advanced age: No follow-up needed if patient is low-risk. Non-contrast chest CT can be considered in 12 months if patient is high-risk. This recommendation follows the consensus statement: Guidelines for Management of Incidental Pulmonary Nodules Detected on CT Images: From the Fleischner Society 2017; Radiology 2017; 284:228-243. Electronically Signed   By: Keith Rake M.D.   On:  09/29/2020 18:43   MR THORACIC SPINE WO CONTRAST  Result Date: 09/29/2020 CLINICAL DATA:  Back pain EXAM: MRI THORACIC SPINE WITHOUT CONTRAST TECHNIQUE: Multiplanar, multisequence MR imaging of the thoracic spine was performed. No intravenous contrast was administered. COMPARISON:  None. FINDINGS: Truncated examination as patient refused postcontrast imaging. Seven series provided. Alignment: Normal Vertebrae: There is an acute compression fracture of T6 with approximately 75% anterior height loss. There is retropulsion of 3 mm. Cord: Normal signal and morphology. There is a ventral epidural collection extending from T3-T12. Paraspinal and other soft tissues: Negative.  Disc levels: Mild spinal canal narrowing at the T6 level due to a slight retropulsion of T6. IMPRESSION: 1. Truncated examination as patient refused postcontrast imaging. 2. Acute compression fracture of T6 with approximately 75% anterior height loss and 3 mm retropulsion. 3. Ventral epidural collection extending from T3-T12, favored to be an epidural hematoma, particularly as the patient is on anticoagulation therapy. This narrows the thecal sac and exerts mass effect on the spinal cord. Critical Value/emergent results were called by telephone at the time of interpretation on 09/29/2020 at 11:12 pm to provider [facility] , who verbally acknowledged these results. Electronically Signed   By: Ulyses Jarred M.D.   On: 09/29/2020 23:12   CT L-SPINE NO CHARGE  Result Date: 09/29/2020 CLINICAL DATA:  Ground level fall at home all using walker in the bathroom. Lost balance leading to fall. Low back pain. EXAM: CT LUMBAR SPINE WITHOUT CONTRAST TECHNIQUE: Multidetector CT imaging of the lumbar spine was performed without intravenous contrast administration. Multiplanar CT image reconstructions were also generated. COMPARISON:  Reformats from abdominopelvic CT 02/21/2015 FINDINGS: Segmentation: 5 lumbar type vertebrae. Alignment: Normal.  Vertebrae: Mild L1 superior endplate compression fracture is chronic. There is a prominent Schmorl's node involving L4 that is chronic. Undulation of inferior L5 endplate is chronic. There is no evidence of acute lumbar fracture. There is an acute fractures through right aspect of the sacrum extending through the sacral foramen, zone 2 fracture. Paraspinal and other soft tissues: Assistant concurrent abdominopelvic CT, reported separately. Disc levels: Minor L5-S1 and L1-L2 disc space narrowing. There is facet hypertrophy at L3-L4 and L5-S1. IMPRESSION: 1. Acute minimally displaced 7 2 right sacral fracture extending through the sacral foramen. 2. No evidence of acute lumbar fracture. 3. Chronic mild L1 compression fracture, unchanged from 2016. Electronically Signed   By: Keith Rake M.D.   On: 09/29/2020 18:49   CT Renal Stone Study  Result Date: 09/29/2020 CLINICAL DATA:  Flank pain.  Kidney stone suspected. EXAM: CT ABDOMEN AND PELVIS WITHOUT CONTRAST TECHNIQUE: Multidetector CT imaging of the abdomen and pelvis was performed following the standard protocol without IV contrast. COMPARISON:  February 21, 2015 FINDINGS: Lower chest: Mild dependent airspace consolidation versus atelectasis, right greater than left. Hepatobiliary: No focal liver abnormality is seen. No gallstones, gallbladder wall thickening, or biliary dilatation. Pancreas: Unremarkable. No pancreatic ductal dilatation or surrounding inflammatory changes. Spleen: Scattered splenic granulomata 1.6 cm circumscribed hypoattenuated lesion in the anterior aspect of the spleen. Adrenals/Urinary Tract: Normal adrenal glands. No evidence of hydronephrosis or obstructive uropathy. 3 mm nonobstructive calculus in the lower pole of the right kidney. 2.7 x 2.1 cm solid mass within the mid polar medial region of the right kidney. 5.3 cm rim calcified exophytic left renal cyst off of the mid polar region of the kidney. Stomach/Bowel: Stomach is within  normal limits. No evidence of bowel wall thickening, distention, or inflammatory changes. Vascular/Lymphatic: Aortic atherosclerosis. No enlarged abdominal or pelvic lymph nodes. Reproductive: Nonspecific coarse calcifications of the prostate gland. Other: No abdominal wall hernia or abnormality. No abdominopelvic ascites. Musculoskeletal: Right total hip arthroplasty. No evidence of acute fractures. IMPRESSION: 1. 2.7 cm indeterminate density mass within the mid polar medial region of the right kidney, not significantly changed from 2016 2. No evidence of obstructive uropathy. 3 mm nonobstructive right lower pole renal calculus. 3. 5.3 cm rim calcified exophytic left renal cyst, stable from 2016. 4. 1.6 cm circumscribed hypoattenuated lesion in the anterior aspect of the spleen, nonspecific. 5. Mild dependent airspace  consolidation versus atelectasis, right greater than left. Aortic Atherosclerosis (ICD10-I70.0). Electronically Signed   By: Fidela Salisbury M.D.   On: 09/29/2020 18:49   DG Hip Unilat With Pelvis 2-3 Views Right  Result Date: 09/29/2020 CLINICAL DATA:  [place], right hip pain EXAM: DG HIP (WITH OR WITHOUT PELVIS) 2-3V RIGHT COMPARISON:  None. FINDINGS: Frontal view of the pelvis as well as frontal and cross-table lateral views of the right hip are obtained. The distal margin of the femoral component of the right hip arthroplasty is excluded by collimation on the lateral view. Bones are diffusely osteopenic. There is a right hip hemiarthroplasty, with long stem femoral component and cerclage wires. Alignment is anatomic. There are no acute displaced fractures. Heterotopic ossification surrounds the right hip arthroplasty. Mild left hip osteoarthritis. Sacroiliac joints are normal. IMPRESSION: 1. No acute displaced pelvic fracture. 2. Unremarkable right hip arthroplasty. Portions of the distal margin of the femoral component of the arthroplasty are excluded by collimation on the lateral view.  Electronically Signed   By: Randa Ngo M.D.   On: 09/29/2020 15:19    Pending Labs Unresulted Labs (From admission, onward)          Start     Ordered   09/30/20 0500  CBC  Tomorrow morning,   R        09/29/20 2135   09/30/20 6389  Basic metabolic panel  Tomorrow morning,   R        09/29/20 2135   09/29/20 2133  Hemoglobin A1c  Once,   STAT       Comments: To assess prior glycemic control    09/29/20 2132   09/29/20 2053  Procalcitonin - Baseline  ONCE - STAT,   STAT        09/29/20 2052   09/29/20 1531  Urine culture  ONCE - STAT,   STAT        09/29/20 1530   09/29/20 1529  Culture, blood (routine x 2)  BLOOD CULTURE X 2,   STAT      09/29/20 1530   09/29/20 1529  Lactic acid, plasma  Now then every 2 hours,   STAT      09/29/20 1530   09/29/20 1337  Urinalysis, Routine w reflex microscopic  ONCE - STAT,   STAT        09/29/20 1337          Vitals/Pain Today's Vitals   09/29/20 2100 09/29/20 2130 09/29/20 2315 09/29/20 2358  BP: 114/87 (!) 147/73 139/70   Pulse: 92 96 95   Resp: (!) 24 (!) 26 (!) 25   Temp:      TempSrc:      SpO2: 100% 99% 100%   Weight:      Height:      PainSc:    0-No pain    Isolation Precautions No active isolations  Medications Medications  fentaNYL (SUBLIMAZE) injection 12.5 mcg (has no administration in time range)  acetaminophen (TYLENOL) tablet 650 mg (has no administration in time range)    Or  acetaminophen (TYLENOL) suppository 650 mg (has no administration in time range)  0.9 %  sodium chloride infusion ( Intravenous New Bag/Given 09/29/20 2332)  atorvastatin (LIPITOR) tablet 40 mg (has no administration in time range)  cycloSPORINE (RESTASIS) 0.05 % ophthalmic emulsion 2 drop (has no administration in time range)  docusate sodium (COLACE) capsule 100 mg (has no administration in time range)  insulin detemir (LEVEMIR) injection 14 Units (14 Units  Subcutaneous Given 09/29/20 2331)  pantoprazole (PROTONIX) EC tablet 40 mg  (40 mg Oral Not Given 09/29/20 2332)  timolol (TIMOPTIC) 0.5 % ophthalmic solution 1 drop (has no administration in time range)  insulin aspart (novoLOG) injection 0-9 Units (has no administration in time range)  insulin aspart (novoLOG) injection 0-5 Units (0 Units Subcutaneous Not Given 09/29/20 2331)  sodium chloride 0.9 % bolus 500 mL (0 mLs Intravenous Stopped 09/29/20 1600)  sodium chloride 0.9 % bolus 1,000 mL (0 mLs Intravenous Stopped 09/29/20 1955)  morphine 4 MG/ML injection 4 mg (4 mg Intravenous Given 09/29/20 1737)    Mobility non-ambulatory

## 2020-10-01 DIAGNOSIS — E785 Hyperlipidemia, unspecified: Secondary | ICD-10-CM | POA: Diagnosis not present

## 2020-10-01 DIAGNOSIS — E1122 Type 2 diabetes mellitus with diabetic chronic kidney disease: Secondary | ICD-10-CM | POA: Diagnosis not present

## 2020-10-01 DIAGNOSIS — S22050A Wedge compression fracture of T5-T6 vertebra, initial encounter for closed fracture: Secondary | ICD-10-CM | POA: Diagnosis not present

## 2020-10-01 DIAGNOSIS — S32121A Minimally displaced Zone II fracture of sacrum, initial encounter for closed fracture: Secondary | ICD-10-CM | POA: Diagnosis not present

## 2020-10-01 LAB — CBC
HCT: 39.3 % (ref 39.0–52.0)
Hemoglobin: 12.7 g/dL — ABNORMAL LOW (ref 13.0–17.0)
MCH: 31.1 pg (ref 26.0–34.0)
MCHC: 32.3 g/dL (ref 30.0–36.0)
MCV: 96.1 fL (ref 80.0–100.0)
Platelets: 108 10*3/uL — ABNORMAL LOW (ref 150–400)
RBC: 4.09 MIL/uL — ABNORMAL LOW (ref 4.22–5.81)
RDW: 14.1 % (ref 11.5–15.5)
WBC: 21.3 10*3/uL — ABNORMAL HIGH (ref 4.0–10.5)
nRBC: 0 % (ref 0.0–0.2)

## 2020-10-01 LAB — COMPREHENSIVE METABOLIC PANEL
ALT: 37 U/L (ref 0–44)
AST: 57 U/L — ABNORMAL HIGH (ref 15–41)
Albumin: 2.8 g/dL — ABNORMAL LOW (ref 3.5–5.0)
Alkaline Phosphatase: 75 U/L (ref 38–126)
Anion gap: 9 (ref 5–15)
BUN: 32 mg/dL — ABNORMAL HIGH (ref 8–23)
CO2: 22 mmol/L (ref 22–32)
Calcium: 8.7 mg/dL — ABNORMAL LOW (ref 8.9–10.3)
Chloride: 106 mmol/L (ref 98–111)
Creatinine, Ser: 1.49 mg/dL — ABNORMAL HIGH (ref 0.61–1.24)
GFR, Estimated: 43 mL/min — ABNORMAL LOW (ref >60)
Glucose, Bld: 295 mg/dL — ABNORMAL HIGH (ref 70–99)
Potassium: 4.3 mmol/L (ref 3.5–5.1)
Sodium: 137 mmol/L (ref 135–145)
Total Bilirubin: 0.8 mg/dL (ref 0.3–1.2)
Total Protein: 5.8 g/dL — ABNORMAL LOW (ref 6.5–8.1)

## 2020-10-01 LAB — GLUCOSE, CAPILLARY
Glucose-Capillary: 280 mg/dL — ABNORMAL HIGH (ref 70–99)
Glucose-Capillary: 210 mg/dL — ABNORMAL HIGH (ref 70–99)
Glucose-Capillary: 174 mg/dL — ABNORMAL HIGH (ref 70–99)
Glucose-Capillary: 165 mg/dL — ABNORMAL HIGH (ref 70–99)

## 2020-10-01 MED ORDER — OXYCODONE HCL 5 MG PO TABS
5.0000 mg | ORAL_TABLET | Freq: Two times a day (BID) | ORAL | Status: DC
  Administered 2020-10-01 – 2020-10-02 (×3): 5 mg via ORAL
  Filled 2020-10-01 (×4): qty 1

## 2020-10-01 MED ORDER — METOPROLOL TARTRATE 25 MG PO TABS
25.0000 mg | ORAL_TABLET | Freq: Two times a day (BID) | ORAL | Status: DC
  Administered 2020-10-01 – 2020-10-03 (×5): 25 mg via ORAL
  Filled 2020-10-01 (×6): qty 1

## 2020-10-01 NOTE — Progress Notes (Signed)
Occupational Therapy Treatment Patient Details Name: Victor Wallace MRN: [id number] DOB: [birth year] Today's Date: 10/01/2020    History of present illness 87 y.o. male who presented 3/31 with low back and R hip pain after he fell at home. Pt found to have acute T6 compression fx with mild bony canal narrowing and acute minimally displaced R sacral fx. MRI of T-spine revealed ventral epidural hematoma extending from T3-T12. CT showing incidental finding of left upper lobe pulmonary nodule measuring 4 mm. CT of cervical spine and head negative for acute incidents. PMH: CAD, HTN, GERD, CKD stage III, paroxysmal A-fib, and DM2.   OT comments  Pt very limited by pain tolerance. Pt was medicated and unable to tolerate against gravity position in TLSO clam shell. Pt high risk for skin break down due to supine positioning on buttock so positioned L side lying and immediately going to sleep with comfort. Discussed with wife and RN concerns that pt is sleeping and staying in brace at all times even when flat in bed. Pt decreased mobility and ability to reposition so high risk for break down with increase sweating in break present. Advised to doff brace supine and only don for HOB increase. Wife expressed concern that he can even tolerate HOB up enough to eat safely. Recommendation SNF at this time pending process.   Follow Up Recommendations  SNF;Supervision/Assistance - 24 hour;Other (comment)    Equipment Recommendations  Hospital bed;Other (comment)    Recommendations for Other Services Other (comment)    Precautions / Restrictions Precautions Precautions: Fall;Back Precaution Comments: clam shell SUPINE Required Braces or Orthoses: Spinal Brace Spinal Brace: Thoracolumbosacral orthotic;Applied in supine position       Mobility Bed Mobility Overal bed mobility: Needs Assistance Bed Mobility: Rolling;Supine to Sit;Sit to Supine Rolling: +2 for physical assistance;Total assist   Supine to  sit: +2 for physical assistance;Total assist Sit to supine: Total assist;+2 for physical assistance   General bed mobility comments: HOB elevated to 45 degrees with pt demonstrating discomfort and requesting to return supine. pt with helicopter to EOB with pad and mod (A) for trunk support. pt intiaites core activation. pt requesting to return supine. pt with stable BP at this time. Pt unable to tolerate eob for long enough for additional BP reading. pt returned to supine> pt rotated to L side lying and immediately falling back to seelp with HOB 15 degrees pillows between knees.    Transfers                 General transfer comment: NT due to pain    Balance Overall balance assessment: Needs assistance Sitting-balance support: Bilateral upper extremity supported;Feet supported Sitting balance-Leahy Scale: Poor                                     ADL either performed or assessed with clinical judgement   ADL Overall ADL's : Needs assistance/impaired                                       General ADL Comments: pt sleeping on arrival initially. OT/ PT agreeable to coming back. On second entry pt  asleeping but arouses to name call. pt in supine position in TLSO clam shell. Pt noted to have leaknig R hand  IV and RN notified. pt with lunch present  but after bed mobility below immediately return ing to sleeping     Vision       Perception     Praxis      Cognition Arousal/Alertness: Awake/alert Behavior During Therapy: Flat affect Overall Cognitive Status: History of cognitive impairments - at baseline                                 General Comments: wife attempt to narrow pain down to one location and pt only  begging to be laid back flat. pt unable to tolerate upright posture        Exercises     Shoulder Instructions       General Comments Bp 139/80  3L02    Pertinent Vitals/ Pain       Pain Assessment:  Faces Faces Pain Scale: Hurts whole lot Pain Location: back R LE Pain Descriptors / Indicators: Discomfort;Crying;Grimacing;Guarding Pain Intervention(s): Monitored during session;Repositioned;Limited activity within patient's tolerance;Other (comment) (return to supine and flat with relief)  Home Living                                          Prior Functioning/Environment              Frequency  Min 2X/week        Progress Toward Goals  OT Goals(current goals can now be found in the care plan section)  Progress towards OT goals: Not progressing toward goals - comment  Acute Rehab OT Goals Patient Stated Goal: wife wants to help patient have comfort in recovery OT Goal Formulation: With patient/family Time For Goal Achievement: 10/14/20 Potential to Achieve Goals: Good ADL Goals Additional ADL Goal #1: pt will tolerate EOB sitting for 5 minutes max (A) Additional ADL Goal #2: Pt will tolerate bed mobility Rolling R and L mod (A) as precursor to brace don/ doff Additional ADL Goal #3: Pt will tolerate basic transfer total +2 max (A) as precursor to adls.  Plan Discharge plan remains appropriate    Co-evaluation    PT/OT/SLP Co-Evaluation/Treatment: Yes Reason for Co-Treatment: Complexity of the patient's impairments (multi-system involvement);Necessary to address cognition/behavior during functional activity;For patient/therapist safety;To address functional/ADL transfers   OT goals addressed during session: ADL's and self-care;Proper use of Adaptive equipment and DME;Strengthening/ROM      AM-PAC OT "6 Clicks" Daily Activity     Outcome Measure   Help from another person eating meals?: A Lot Help from another person taking care of personal grooming?: A Lot Help from another person toileting, which includes using toliet, bedpan, or urinal?: Total Help from another person bathing (including washing, rinsing, drying)?: Total Help from another  person to put on and taking off regular upper body clothing?: Total Help from another person to put on and taking off regular lower body clothing?: Total 6 Click Score: 8    End of Session Equipment Utilized During Treatment: Oxygen;Back brace  OT Visit Diagnosis: Unsteadiness on feet (R26.81);Pain;Muscle weakness (generalized) (M62.81)   Activity Tolerance Patient limited by pain   Patient Left in bed;with call bell/phone within reach;with bed alarm set;with family/visitor present   Nurse Communication Mobility status;Precautions        Time: 5456-2563 OT Time Calculation (min): 38 min  Charges: OT General Charges $OT Visit: 1 Visit OT Treatments $Self Care/Home Management : 23-37 mins  Fleeta Emmer, OTR/L  Acute Rehabilitation Services Pager: [phone] Office: [phone] .    Jeri Modena 10/01/2020, 1:19 PM

## 2020-10-01 NOTE — Progress Notes (Addendum)
PROGRESS NOTE    Victor Wallace  YIR:[id number] DOB: 03/29/1925 DOA: 09/29/2020 PCP: Haywood Pao, MD    Brief Narrative:  87 y/o male admitted to the hospital after a fall, and found to have an acute sacral fracture and acute T6 compression fracture with large epidural hematoma. Seen by neurosurgery and nonoperative management recommended. Pain is currently uncontrolled. Palliative care consulted to help address goals of care.   Assessment & Plan:   Principal Problem:   Thoracic compression fracture (HCC) Active Problems:   Hyperlipidemia   Type 2 diabetes mellitus with renal manifestations, controlled (HCC)   Hypotension   Sacral fracture (HCC)   Acute T6 compression fracture with large epidural hematoma -secondary to unwitnessed fall -seen by neurosurgery -non surgical management recommended with pain control, PT and TLSO brace -family in agreement to pursue non operative approach  Acute minimally displaced right sacral fracture.  -secondary to fall -images reviewed with Dr. Erlinda Hong, ortho, who recommended non operative management -WBAT, PT/OT  Hypotension, mild lactic acidosis -no clear source of infection -WBC count continues to rise -possibly stress response -continue on IV fluids -blood cultures haven't shown any growth as of yet -he is on empiric antibiotics for now -will discontinue in 24 hours if no signs of infection develop  AKI on CKD stage 3b -creatinine at 1.8 on admission -overall improved to 1.3 with IV fluids -continue to monitor  Paroxysmal A fib -continue metoprolol for rate control -hold anticoagulation with epidural hematoma -per wife, patient has not taking any anticoagulation in the last 3 months  DM2, insulin dependent -continued on basal insulin -continue on SSI -blood sugars elevated, but better than yesterday -A1c 8.4  Pulmonary nodule -noted on CT, can consider follow up if desired  Bilateral subdural hygromas -likely  chronic subdural hematomas -no midline shift  Goals of care -will consult palliative care to help with discussions around goals of care and plans moving forward -patient appears to be a reasonable candidate for hospice -will also ask for assistance with pain management.   DVT prophylaxis: Place and maintain sequential compression device Start: 09/29/20 2128  Code Status: DNR Family Communication: discussed with wife at the bedside Disposition Plan: Status is: Inpatient  The patient will require care spanning > 2 midnights and should be moved to inpatient because: Ongoing active pain requiring inpatient pain management  Dispo: The patient is from: Home              Anticipated d/c is to:  TBD              Patient currently is not medically stable to d/c.   Difficult to place patient No   Consultants:  Neurosurgery Palliative care  Procedures:    Antimicrobials:  vancomycin 3/31> Zosyn 3/31>4/1 Ceftriaxone 4/1>   Subjective: Feels that pain is unchanged when compared to yesterday. Has to lay flat, raising the head of bed causes pain  Objective: Vitals:   10/01/20 0341 10/01/20 0400 10/01/20 0728 10/01/20 1125  BP:   (!) 159/90 (!) 150/82  Pulse:   (!) 102 90  Resp: 20 20 (!) 24 (!) 21  Temp: 98.2 F (36.8 C) 98.8 F (37.1 C) 98.2 F (36.8 C) 98.7 F (37.1 C)  TempSrc: Oral Oral Oral Oral  SpO2:  93% 93% 96%  Weight:      Height:        Intake/Output Summary (Last 24 hours) at 10/01/2020 1415 Last data filed at 10/01/2020 0600 Gross per 24 hour  Intake 1100 ml  Output 1300 ml  Net -200 ml   Filed Weights   09/29/20 1341  Weight: 90 kg    Examination:  General exam: sleeping, but wakes up to voice Respiratory system: Clear to auscultation. Respiratory effort normal. TLSO brace in place Cardiovascular system:RRR. No murmurs, rubs, gallops. Gastrointestinal system: Abdomen is nondistended, soft and nontender. No organomegaly or masses felt. Normal bowel  sounds heard. Central nervous system: No focal neurological deficits. Extremities: No C/C/E, +pedal pulses Skin: No rashes, lesions or ulcers Psychiatry: calm, pleasant     Data Reviewed: I have personally reviewed following labs and imaging studies  CBC: Recent Labs  Lab 09/29/20 1417 09/30/20 0521 10/01/20 0429  WBC 16.4* 19.5* 21.3*  NEUTROABS 13.6*  --   --   HGB 13.4 12.8* 12.7*  HCT 42.5 40.0 39.3  MCV 99.3 97.3 96.1  PLT 159 127* 909*   Basic Metabolic Panel: Recent Labs  Lab 09/29/20 1417 09/30/20 0500 10/01/20 0429  NA 140 140 137  K 4.1 4.9 4.3  CL 108 109 106  CO2 25 21* 22  GLUCOSE 165* 341* 295*  BUN 28* 32* 32*  CREATININE 1.53* 1.84* 1.49*  CALCIUM 9.1 8.8* 8.7*   GFR: Estimated Creatinine Clearance: 33.3 mL/min (A) (by C-G formula based on SCr of 1.49 mg/dL (H)). Liver Function Tests: Recent Labs  Lab 10/01/20 0429  AST 57*  ALT 37  ALKPHOS 75  BILITOT 0.8  PROT 5.8*  ALBUMIN 2.8*   No results for input(s): LIPASE, AMYLASE in the last 168 hours. No results for input(s): AMMONIA in the last 168 hours. Coagulation Profile: No results for input(s): INR, PROTIME in the last 168 hours. Cardiac Enzymes: No results for input(s): CKTOTAL, CKMB, CKMBINDEX, TROPONINI in the last 168 hours. BNP (last 3 results) No results for input(s): PROBNP in the last 8760 hours. HbA1C: Recent Labs    09/29/20 2330  HGBA1C 8.4*   CBG: Recent Labs  Lab 09/30/20 1142 09/30/20 1740 09/30/20 2159 10/01/20 0727 10/01/20 1123  GLUCAP 293* 314* 367* 280* 210*   Lipid Profile: No results for input(s): CHOL, HDL, LDLCALC, TRIG, CHOLHDL, LDLDIRECT in the last 72 hours. Thyroid Function Tests: No results for input(s): TSH, T4TOTAL, FREET4, T3FREE, THYROIDAB in the last 72 hours. Anemia Panel: No results for input(s): VITAMINB12, FOLATE, FERRITIN, TIBC, IRON, RETICCTPCT in the last 72 hours. Sepsis Labs: Recent Labs  Lab 09/29/20 1608 09/29/20 2330  09/30/20 0521  PROCALCITON  --  0.56  --   LATICACIDVEN 2.1*  --  2.7*    Recent Results (from the past 240 hour(s))  Culture, blood (routine x 2)     Status: None (Preliminary result)   Collection Time: 09/29/20  3:59 PM   Specimen: BLOOD  Result Value Ref Range Status   Specimen Description   Final    BLOOD RIGHT WRIST Performed at [facility] [address], [place], [place] [address]    Special Requests   Final    BOTTLES DRAWN AEROBIC AND ANAEROBIC Blood Culture results may not be optimal due to an inadequate volume of blood received in culture bottles Performed at [facility] [address], [place], [place] [address]    Culture   Final    NO GROWTH 2 DAYS Performed at [facility] [address] [place], [place] [address]    Report Status PENDING  Incomplete  Culture, blood (routine x 2)     Status: None (Preliminary  result)   Collection Time: 09/29/20  4:09 PM   Specimen: BLOOD  Result Value Ref Range Status   Specimen Description   Final    BLOOD RIGHT ANTECUBITAL Performed at [facility] [address], [place], [place] [address]    Special Requests   Final    BOTTLES DRAWN AEROBIC AND ANAEROBIC Blood Culture results may not be optimal due to an inadequate volume of blood received in culture bottles Performed at [facility] [address], [place], [place] [address]    Culture   Final    NO GROWTH 2 DAYS Performed at [facility] [address], [place], [place] [address]    Report Status PENDING  Incomplete  Resp Panel by RT-PCR (Flu A&B, Covid) Nasopharyngeal Swab     Status: None   Collection Time: 09/29/20  8:46 PM   Specimen: Nasopharyngeal Swab; Nasopharyngeal(NP) swabs in vial transport medium  Result Value Ref Range Status   SARS Coronavirus 2 by RT PCR NEGATIVE NEGATIVE Final    Comment: (NOTE) SARS-CoV-2 target nucleic acids are NOT DETECTED.  The  SARS-CoV-2 RNA is generally detectable in upper respiratory specimens during the acute phase of infection. The lowest concentration of SARS-CoV-2 viral copies this assay can detect is 138 copies/mL. A negative result does not preclude SARS-Cov-2 infection and should not be used as the sole basis for treatment or other patient management decisions. A negative result may occur with  improper specimen collection/handling, submission of specimen other than nasopharyngeal swab, presence of viral mutation(s) within the areas targeted by this assay, and inadequate number of viral copies(<138 copies/mL). A negative result must be combined with clinical observations, patient history, and epidemiological information. The expected result is Negative.  Fact Sheet for Patients:  EntrepreneurPulse.com.au  Fact Sheet for Healthcare Providers:  IncredibleEmployment.be  This test is no t yet approved or cleared by the [place] FDA and  has been authorized for detection and/or diagnosis of SARS-CoV-2 by FDA under an Emergency Use Authorization (EUA). This EUA will remain  in effect (meaning this test can be used) for the duration of the COVID-19 declaration under Section 564(b)(1) of the Act, 21 U.S.C.section 360bbb-3(b)(1), unless the authorization is terminated  or revoked sooner.       Influenza A by PCR NEGATIVE NEGATIVE Final   Influenza B by PCR NEGATIVE NEGATIVE Final    Comment: (NOTE) The Xpert Xpress SARS-CoV-2/FLU/RSV plus assay is intended as an aid in the diagnosis of influenza from Nasopharyngeal swab specimens and should not be used as a sole basis for treatment. Nasal washings and aspirates are unacceptable for Xpert Xpress SARS-CoV-2/FLU/RSV testing.  Fact Sheet for Patients: EntrepreneurPulse.com.au  Fact Sheet for Healthcare Providers: IncredibleEmployment.be  This test is not yet approved or  cleared by the [place] FDA and has been authorized for detection and/or diagnosis of SARS-CoV-2 by FDA under an Emergency Use Authorization (EUA). This EUA will remain in effect (meaning this test can be used) for the duration of the COVID-19 declaration under Section 564(b)(1) of the Act, 21 U.S.C. section 360bbb-3(b)(1), unless the authorization is terminated or revoked.  Performed at [facility], [place] [address], [place],  [address]   MRSA PCR Screening     Status: None   Collection Time: 09/30/20  2:21 AM   Specimen: Nasal Mucosa; Nasopharyngeal  Result Value Ref Range Status   MRSA by PCR NEGATIVE NEGATIVE Final    Comment:  The GeneXpert MRSA Assay (FDA approved for NASAL specimens only), is one component of a comprehensive MRSA colonization surveillance program. It is not intended to diagnose MRSA infection nor to guide or monitor treatment for MRSA infections. Performed at [facility], [place] [address], [place], [place] [address]          Radiology Studies: DG Chest 1 View  Result Date: 09/29/2020 CLINICAL DATA:  Loss of balance, fall, hip pain EXAM: CHEST  1 VIEW COMPARISON:  08/10/2019 FINDINGS: The heart size and mediastinal contours are within normal limits. Both lungs are clear. The visualized skeletal structures are unremarkable. IMPRESSION: No active disease. Electronically Signed   By: Randa Ngo M.D.   On: 09/29/2020 15:20   CT Head Wo Contrast  Result Date: 09/29/2020 CLINICAL DATA:  Loss of balance, fell, hip pain EXAM: CT HEAD WITHOUT CONTRAST TECHNIQUE: Contiguous axial images were obtained from the base of the skull through the vertex without intravenous contrast. COMPARISON:  01/31/2012 FINDINGS: Brain: Hypodensities within the periventricular white matter are most consistent with chronic small vessel ischemic changes. No acute infarct or hemorrhage. Lateral ventricles and remaining midline structures are  unremarkable. There are no acute extra-axial fluid collections. CSF attenuation fluid along the cerebral convexities measure up to 0.7 cm on the right and 0.9 cm on the left, consistent with subdural hygromas or chronic subdural hematomas. These are new since prior study. No mass effect. Vascular: No hyperdense vessel or unexpected calcification. Skull: Normal. Negative for fracture or focal lesion. Sinuses/Orbits: Mild polypoid mucosal thickening within the bilateral maxillary sinuses unchanged. Remaining sinuses are clear. Other: None. IMPRESSION: 1. Extra-axial CSF attenuation collections along the bilateral convexities, consistent with subdural hygromas or chronic subdural hematoma. No mass effect. 2. No acute intracranial hemorrhage. 3. Chronic small vessel ischemic changes within the white matter. No acute infarct. Electronically Signed   By: Randa Ngo M.D.   On: 09/29/2020 18:38   CT Cervical Spine Wo Contrast  Result Date: 09/29/2020 CLINICAL DATA:  [place], loss of balance EXAM: CT CERVICAL SPINE WITHOUT CONTRAST TECHNIQUE: Multidetector CT imaging of the cervical spine was performed without intravenous contrast. Multiplanar CT image reconstructions were also generated. COMPARISON:  None. FINDINGS: Alignment: Right convex scoliosis centered at C5. Otherwise alignment is anatomic. Skull base and vertebrae: No acute fracture. No primary bone lesion or focal pathologic process. Soft tissues and spinal canal: No prevertebral fluid or swelling. No visible canal hematoma. 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 Prominent spondylosis at C3-4, C5-6, and C6-7. Mild diffuse bilateral facet hypertrophy. Neural foraminal narrowing most pronounced at C3-4 and C5-6. Upper chest: Airway is patent.  Lung apices are clear. Other: Reconstructed images demonstrate no additional findings. IMPRESSION: 1. No acute cervical spine fracture. 2. Extensive multilevel cervical  spondylosis and facet hypertrophy. Electronically Signed   By: Randa Ngo M.D.   On: 09/29/2020 18:40   CT Thoracic Spine Wo Contrast  Result Date: 09/29/2020 CLINICAL DATA:  Ground level fall at home using walker in the bathroom. Lost balance leading to fall. Back pain. EXAM: CT THORACIC SPINE WITHOUT CONTRAST TECHNIQUE: Multidetector CT images of the thoracic were obtained using the standard protocol without intravenous contrast. COMPARISON:  No prior thoracic spine imaging. Chest radiograph 05/16/2017 reviewed FINDINGS: Alignment: Exaggerated upper thoracic kyphosis. Vertebrae: Acute T6 compression fracture. 40% loss of height anteriorly. There is posterior cortex involvement with cortical buckling of bony retropulsion of 2 mm. There is a nondisplaced  fracture component that involves the spinous process of T6. No convincing pedicle or laminar involvement. No transverse process involvement. No other acute fracture. Occasional Schmorl's nodes as well as multilevel anterior spurring. Paraspinal and other soft tissues: 4 mm left upper lobe pulmonary nodule, series 3, image 40. Hypoventilatory changes in the dependent lungs. Calcified right hilar lymph nodes consistent with prior granulomatous disease. No acute fracture of included ribs. Disc levels: There is mild bony canal narrowing at T6 related to mild bony retropulsion. No other canal narrowing. Minor disc space narrowing in the lower thoracic spine with anterior spurring. IMPRESSION: 1. Acute T6 compression fracture with 40% loss of height anteriorly and 2 mm bony retropulsion. Mild bony canal narrowing at T6 related to bony retropulsion. Nondisplaced fracture extends through the spinous process of T6. 2. No other acute fracture of the thoracic spine. 3. Incidental note of a left upper lobe pulmonary nodule measuring 7mm. No prior exams available for comparison. Follow-up recommendation for a nodule of this size as follows, giving consideration to  patient's advanced age: No follow-up needed if patient is low-risk. Non-contrast chest CT can be considered in 12 months if patient is high-risk. This recommendation follows the consensus statement: Guidelines for Management of Incidental Pulmonary Nodules Detected on CT Images: From the Fleischner Society 2017; Radiology 2017; 284:228-243. Electronically Signed   By: Keith Rake M.D.   On: 09/29/2020 18:43   MR THORACIC SPINE WO CONTRAST  Result Date: 09/29/2020 CLINICAL DATA:  Back pain EXAM: MRI THORACIC SPINE WITHOUT CONTRAST TECHNIQUE: Multiplanar, multisequence MR imaging of the thoracic spine was performed. No intravenous contrast was administered. COMPARISON:  None. FINDINGS: Truncated examination as patient refused postcontrast imaging. Seven series provided. Alignment: Normal Vertebrae: There is an acute compression fracture of T6 with approximately 75% anterior height loss. There is retropulsion of 3 mm. Cord: Normal signal and morphology. There is a ventral epidural collection extending from T3-T12. Paraspinal and other soft tissues: Negative. Disc levels: Mild spinal canal narrowing at the T6 level due to a slight retropulsion of T6. IMPRESSION: 1. Truncated examination as patient refused postcontrast imaging. 2. Acute compression fracture of T6 with approximately 75% anterior height loss and 3 mm retropulsion. 3. Ventral epidural collection extending from T3-T12, favored to be an epidural hematoma, particularly as the patient is on anticoagulation therapy. This narrows the thecal sac and exerts mass effect on the spinal cord. Critical Value/emergent results were called by telephone at the time of interpretation on 09/29/2020 at 11:12 pm to provider [facility] , who verbally acknowledged these results. Electronically Signed   By: Ulyses Jarred M.D.   On: 09/29/2020 23:12   CT L-SPINE NO CHARGE  Result Date: 09/29/2020 CLINICAL DATA:  Ground level fall at home all using walker in the  bathroom. Lost balance leading to fall. Low back pain. EXAM: CT LUMBAR SPINE WITHOUT CONTRAST TECHNIQUE: Multidetector CT imaging of the lumbar spine was performed without intravenous contrast administration. Multiplanar CT image reconstructions were also generated. COMPARISON:  Reformats from abdominopelvic CT 02/21/2015 FINDINGS: Segmentation: 5 lumbar type vertebrae. Alignment: Normal. Vertebrae: Mild L1 superior endplate compression fracture is chronic. There is a prominent Schmorl's node involving L4 that is chronic. Undulation of inferior L5 endplate is chronic. There is no evidence of acute lumbar fracture. There is an acute fractures through right aspect of the sacrum extending through the sacral foramen, zone 2 fracture. Paraspinal and other soft tissues: Assistant concurrent abdominopelvic CT, reported separately. Disc levels: Minor L5-S1 and L1-L2 disc space  narrowing. There is facet hypertrophy at L3-L4 and L5-S1. IMPRESSION: 1. Acute minimally displaced 7 2 right sacral fracture extending through the sacral foramen. 2. No evidence of acute lumbar fracture. 3. Chronic mild L1 compression fracture, unchanged from 2016. Electronically Signed   By: Narda Rutherford M.D.   On: 09/29/2020 18:49   CT Renal Stone Study  Result Date: 09/29/2020 CLINICAL DATA:  Flank pain.  Kidney stone suspected. EXAM: CT ABDOMEN AND PELVIS WITHOUT CONTRAST TECHNIQUE: Multidetector CT imaging of the abdomen and pelvis was performed following the standard protocol without IV contrast. COMPARISON:  February 21, 2015 FINDINGS: Lower chest: Mild dependent airspace consolidation versus atelectasis, right greater than left. Hepatobiliary: No focal liver abnormality is seen. No gallstones, gallbladder wall thickening, or biliary dilatation. Pancreas: Unremarkable. No pancreatic ductal dilatation or surrounding inflammatory changes. Spleen: Scattered splenic granulomata 1.6 cm circumscribed hypoattenuated lesion in the anterior aspect  of the spleen. Adrenals/Urinary Tract: Normal adrenal glands. No evidence of hydronephrosis or obstructive uropathy. 3 mm nonobstructive calculus in the lower pole of the right kidney. 2.7 x 2.1 cm solid mass within the mid polar medial region of the right kidney. 5.3 cm rim calcified exophytic left renal cyst off of the mid polar region of the kidney. Stomach/Bowel: Stomach is within normal limits. No evidence of bowel wall thickening, distention, or inflammatory changes. Vascular/Lymphatic: Aortic atherosclerosis. No enlarged abdominal or pelvic lymph nodes. Reproductive: Nonspecific coarse calcifications of the prostate gland. Other: No abdominal wall hernia or abnormality. No abdominopelvic ascites. Musculoskeletal: Right total hip arthroplasty. No evidence of acute fractures. IMPRESSION: 1. 2.7 cm indeterminate density mass within the mid polar medial region of the right kidney, not significantly changed from 2016 2. No evidence of obstructive uropathy. 3 mm nonobstructive right lower pole renal calculus. 3. 5.3 cm rim calcified exophytic left renal cyst, stable from 2016. 4. 1.6 cm circumscribed hypoattenuated lesion in the anterior aspect of the spleen, nonspecific. 5. Mild dependent airspace consolidation versus atelectasis, right greater than left. Aortic Atherosclerosis (ICD10-I70.0). Electronically Signed   By: Ted Mcalpine M.D.   On: 09/29/2020 18:49   DG Hip Unilat With Pelvis 2-3 Views Right  Result Date: 09/29/2020 CLINICAL DATA:  Larey Seat, right hip pain EXAM: DG HIP (WITH OR WITHOUT PELVIS) 2-3V RIGHT COMPARISON:  None. FINDINGS: Frontal view of the pelvis as well as frontal and cross-table lateral views of the right hip are obtained. The distal margin of the femoral component of the right hip arthroplasty is excluded by collimation on the lateral view. Bones are diffusely osteopenic. There is a right hip hemiarthroplasty, with long stem femoral component and cerclage wires. Alignment is  anatomic. There are no acute displaced fractures. Heterotopic ossification surrounds the right hip arthroplasty. Mild left hip osteoarthritis. Sacroiliac joints are normal. IMPRESSION: 1. No acute displaced pelvic fracture. 2. Unremarkable right hip arthroplasty. Portions of the distal margin of the femoral component of the arthroplasty are excluded by collimation on the lateral view. Electronically Signed   By: Sharlet Salina M.D.   On: 09/29/2020 15:19        Scheduled Meds:  atorvastatin  40 mg Oral Daily   cycloSPORINE  2 drop Both Eyes BID   docusate sodium  100 mg Oral Daily   insulin aspart  0-20 Units Subcutaneous TID WC   insulin aspart  0-5 Units Subcutaneous QHS   insulin detemir  14 Units Subcutaneous QHS   oxyCODONE  5 mg Oral BID   pantoprazole  40 mg Oral Daily  timolol  1 drop Left Eye BID   Continuous Infusions:  cefTRIAXone (ROCEPHIN)  IV 2 g (09/30/20 1744)   lactated ringers 100 mL/hr at 10/01/20 0340   vancomycin       LOS: 1 day    Time spent: 39mins    Kathie Dike, MD [facility]   If 7PM-7AM, please contact night-coverage [email or web address]  10/01/2020, 2:15 PM

## 2020-10-01 NOTE — Evaluation (Signed)
Physical Therapy Evaluation Patient Details Name: Victor Wallace MRN: [id number] DOB: [birth year] Today's Date: 10/01/2020   History of Present Illness  87 y.o. male who presented 3/31 with low back and R hip pain after he fell at home. Pt found to have acute T6 compression fx with mild bony canal narrowing and acute minimally displaced R sacral fx. MRI of T-spine revealed ventral epidural hematoma extending from T3-T12. CT showing incidental finding of left upper lobe pulmonary nodule measuring 4 mm. CT of cervical spine and head negative for acute incidents. PMH: CAD, HTN, GERD, CKD stage III, paroxysmal A-fib, and DM2.  Clinical Impression  Pt presents with condition above and deficits mentioned below, see PT Problem List. Wife concerned with ability to (A) at home but reports that CIR is higher level than he was doing at home PTA. Wife is open to hearing about any attempts to help him come home with her that helps her manage him. Wife educated that home with a lot of help or SNF are the options at this time from a rehab perspective. Wife concerned with pt mental state at SNF.  Pt will benefit from skilled PT to increase their independence and safety with functional mobility to allow discharge SNF pending a home plan that allows wife to have more (A). Son lives in the home and can (A) some but not at all times. Wife will be primary caregiver. At this time, pt unable to tolerate upright sitting due to extreme pain. Pt with increased comfort when supine and slightly rolled to L. Pt needing TAx2 for all functional mobility at this time. Discussed skin integrity concerns with RN and need for prevalon boots. Will continue to follow acutely.    Follow Up Recommendations SNF;Supervision/Assistance - 24 hour    Equipment Recommendations  None recommended by PT    Recommendations for Other Services       Precautions / Restrictions Precautions Precautions: Fall;Back Precaution Comments: clam shell  SUPINE Required Braces or Orthoses: Spinal Brace Spinal Brace: Thoracolumbosacral orthotic;Applied in supine position Restrictions Weight Bearing Restrictions: No      Mobility  Bed Mobility Overal bed mobility: Needs Assistance Bed Mobility: Rolling;Supine to Sit;Sit to Supine Rolling: Total assist;+2 for physical assistance;+2 for safety/equipment   Supine to sit: Total assist;+2 for physical assistance;+2 for safety/equipment Sit to supine: Total assist;+2 for physical assistance;+2 for safety/equipment   General bed mobility comments: HOB elevated to 45 degrees with pt demonstrating discomfort and requesting to return supine. pt with helicopter to EOB with pad and mod (A) for trunk support. pt intiaites core activation. pt requesting to return supine. pt with stable BP at this time. Pt unable to tolerate eob for long enough for additional BP reading. pt returned to supine> pt rotated to L side lying and immediately falling back to sleep with HOB 15 degrees pillows between knees.    Transfers                 General transfer comment: NT due to pain  Ambulation/Gait             General Gait Details: NT due to pain  Stairs            Wheelchair Mobility    Modified Rankin (Stroke Patients Only)       Balance Overall balance assessment: Needs assistance Sitting-balance support: Bilateral upper extremity supported;Feet supported Sitting balance-Leahy Scale: Poor Sitting balance - Comments: Mod-max posterior support sitting EOB  Pertinent Vitals/Pain Pain Assessment: Faces Faces Pain Scale: Hurts whole lot Pain Location: back R leg Pain Descriptors / Indicators: Grimacing;Discomfort;Moaning;Crying;Guarding Pain Intervention(s): Limited activity within patient's tolerance;Monitored during session;Repositioned (return to supine and flat with relief)    Home Living Family/patient expects to be  discharged to:: Private residence Living Arrangements: Spouse/significant other Available Help at Discharge: Family;Available 24 hours/day Type of Home: House Home Access: Stairs to enter;Other (comment) (can have ramp put on)   Entrance Stairs-Number of Steps: 3 Home Layout: One level Home Equipment: Walker - 2 wheels;Walker - 4 wheels;Walker - standard;Cane - single point;Crutches;Bedside commode;Shower seat;Hand held shower head;Wheelchair - manual;Electric scooter;Transport chair;Hospital bed;Other (comment) (lift chair) Additional Comments: wife reports that she helps with all activities for patient but he walks himself using a upright walker    Prior Function Level of Independence: Needs assistance   Gait / Transfers Assistance Needed: uses upright walker in the house that is carpet (expect two rooms), pt uses RW to go to the car as it is easier for wife to lift. pt has transport chair and wheelchair that wife can use as well  ADL's / Homemaking Assistance Needed: wife helps sponge bath  Comments: wife reports son is failure to launch child and lives within the home with them ( pt and wife have been married for 20 years ( 2002) son is not his biological son)     Engineer, manufacturing Dominance   Dominant Hand: Right    Extremity/Trunk Assessment   Upper Extremity Assessment Upper Extremity Assessment: Defer to OT evaluation    Lower Extremity Assessment Lower Extremity Assessment: Generalized weakness    Cervical / Trunk Assessment Cervical / Trunk Assessment: Other exceptions (spinal fx) Cervical / Trunk Exceptions: T6 fx  Communication      Cognition Arousal/Alertness: Awake/alert Behavior During Therapy: Flat affect Overall Cognitive Status: History of cognitive impairments - at baseline                                 General Comments: wife attempt to narrow pain down to one location and pt only  begging to be laid back flat. pt unable to tolerate upright posture       General Comments General comments (skin integrity, edema, etc.): Bp 139/80  3L02    Exercises     Assessment/Plan    PT Assessment Patient needs continued PT services  PT Problem List Decreased strength;Decreased range of motion;Decreased balance;Decreased activity tolerance;Decreased mobility;Decreased coordination;Decreased cognition;Decreased knowledge of use of DME;Decreased safety awareness;Decreased knowledge of precautions;Pain;Decreased skin integrity       PT Treatment Interventions DME instruction;Gait training;Stair training;Functional mobility training;Therapeutic activities;Therapeutic exercise;Balance training;Neuromuscular re-education;Cognitive remediation;Patient/family education    PT Goals (Current goals can be found in the Care Plan section)  Acute Rehab PT Goals Patient Stated Goal: to return home PT Goal Formulation: With patient/family Time For Goal Achievement: 10/15/20 Potential to Achieve Goals: Fair    Frequency Min 5X/week   Barriers to discharge        Co-evaluation PT/OT/SLP Co-Evaluation/Treatment: Yes Reason for Co-Treatment: Complexity of the patient's impairments (multi-system involvement);Necessary to address cognition/behavior during functional activity;For patient/therapist safety;To address functional/ADL transfers PT goals addressed during session: Mobility/safety with mobility;Balance OT goals addressed during session: ADL's and self-care;Proper use of Adaptive equipment and DME;Strengthening/ROM       AM-PAC PT "6 Clicks" Mobility  Outcome Measure Help needed turning from your back to your side while in a flat bed  without using bedrails?: Total Help needed moving from lying on your back to sitting on the side of a flat bed without using bedrails?: Total Help needed moving to and from a bed to a chair (including a wheelchair)?: Total Help needed standing up from a chair using your arms (e.g., wheelchair or bedside chair)?:  Total Help needed to walk in hospital room?: Total Help needed climbing 3-5 steps with a railing? : Total 6 Click Score: 6    End of Session Equipment Utilized During Treatment: Back brace Activity Tolerance: Patient limited by pain;Other (comment) (limited by cognition) Patient left: in bed;with call bell/phone within reach;with bed alarm set;with family/visitor present Nurse Communication: Mobility status;Other (comment) (concerns for skin breakdown and need for prevalon boots) PT Visit Diagnosis: Muscle weakness (generalized) (M62.81);History of falling (Z91.81);Difficulty in walking, not elsewhere classified (R26.2);Pain Pain - Right/Left:  (back) Pain - part of body:  (back)    Time: 6440-3474 PT Time Calculation (min) (ACUTE ONLY): 38 min   Charges:   PT Evaluation $PT Eval Moderate Complexity: 1 Mod          Moishe Spice, PT, DPT Acute Rehabilitation Services  Pager: [phone] Office: [phone]   Orvan Falconer 10/01/2020, 1:38 PM

## 2020-10-02 DIAGNOSIS — F0390 Unspecified dementia without behavioral disturbance: Secondary | ICD-10-CM

## 2020-10-02 DIAGNOSIS — S32121A Minimally displaced Zone II fracture of sacrum, initial encounter for closed fracture: Secondary | ICD-10-CM | POA: Diagnosis not present

## 2020-10-02 DIAGNOSIS — S22052A Unstable burst fracture of T5-T6 vertebra, initial encounter for closed fracture: Secondary | ICD-10-CM

## 2020-10-02 DIAGNOSIS — W19XXXA Unspecified fall, initial encounter: Secondary | ICD-10-CM | POA: Diagnosis not present

## 2020-10-02 DIAGNOSIS — S22050A Wedge compression fracture of T5-T6 vertebra, initial encounter for closed fracture: Secondary | ICD-10-CM | POA: Diagnosis not present

## 2020-10-02 DIAGNOSIS — Z515 Encounter for palliative care: Secondary | ICD-10-CM

## 2020-10-02 DIAGNOSIS — E785 Hyperlipidemia, unspecified: Secondary | ICD-10-CM | POA: Diagnosis not present

## 2020-10-02 LAB — BASIC METABOLIC PANEL
Anion gap: 6 (ref 5–15)
BUN: 25 mg/dL — ABNORMAL HIGH (ref 8–23)
CO2: 26 mmol/L (ref 22–32)
Calcium: 8.8 mg/dL — ABNORMAL LOW (ref 8.9–10.3)
Chloride: 107 mmol/L (ref 98–111)
Creatinine, Ser: 1.3 mg/dL — ABNORMAL HIGH (ref 0.61–1.24)
GFR, Estimated: 51 mL/min — ABNORMAL LOW (ref >60)
Glucose, Bld: 168 mg/dL — ABNORMAL HIGH (ref 70–99)
Potassium: 4.1 mmol/L (ref 3.5–5.1)
Sodium: 139 mmol/L (ref 135–145)

## 2020-10-02 LAB — GLUCOSE, CAPILLARY
Glucose-Capillary: 148 mg/dL — ABNORMAL HIGH (ref 70–99)
Glucose-Capillary: 129 mg/dL — ABNORMAL HIGH (ref 70–99)
Glucose-Capillary: 108 mg/dL — ABNORMAL HIGH (ref 70–99)
Glucose-Capillary: 123 mg/dL — ABNORMAL HIGH (ref 70–99)

## 2020-10-02 LAB — CBC
HCT: 39.3 % (ref 39.0–52.0)
Hemoglobin: 12.7 g/dL — ABNORMAL LOW (ref 13.0–17.0)
MCH: 31.4 pg (ref 26.0–34.0)
MCHC: 32.3 g/dL (ref 30.0–36.0)
MCV: 97 fL (ref 80.0–100.0)
Platelets: 69 10*3/uL — ABNORMAL LOW (ref 150–400)
RBC: 4.05 MIL/uL — ABNORMAL LOW (ref 4.22–5.81)
RDW: 14.1 % (ref 11.5–15.5)
WBC: 17.1 10*3/uL — ABNORMAL HIGH (ref 4.0–10.5)
nRBC: 0 % (ref 0.0–0.2)

## 2020-10-02 MED ORDER — BACLOFEN 1 MG/ML ORAL SUSPENSION
10.0000 mg | Freq: Three times a day (TID) | ORAL | Status: DC
  Administered 2020-10-02: 10 mg via ORAL
  Filled 2020-10-02 (×9): qty 10

## 2020-10-02 MED ORDER — MAGNESIUM HYDROXIDE 400 MG/5ML PO SUSP: 30.0000 mL | Freq: Once | ORAL | Status: DC

## 2020-10-02 MED ORDER — POLYETHYLENE GLYCOL 3350 17 G PO PACK: 17.0000 g | PACK | Freq: Every day | ORAL | Status: DC

## 2020-10-02 MED ORDER — FENTANYL 12 MCG/HR TD PT72
1.0000 | MEDICATED_PATCH | TRANSDERMAL | Status: DC
  Administered 2020-10-03: 1 via TRANSDERMAL
  Filled 2020-10-02 (×2): qty 1

## 2020-10-02 NOTE — Progress Notes (Signed)
PROGRESS NOTE    Victor Wallace  WPY:[id number] DOB: January 14, 1925 DOA: 09/29/2020 PCP: Haywood Pao, MD    Brief Narrative:  87 y/o male admitted to the hospital after a fall, and found to have an acute sacral fracture and acute T6 compression fracture with large epidural hematoma. Seen by neurosurgery and nonoperative management recommended. Pain is currently uncontrolled. Palliative care consulted to help address goals of care.   Assessment & Plan:   Principal Problem:   Thoracic compression fracture (HCC) Active Problems:   Hyperlipidemia   Type 2 diabetes mellitus with renal manifestations, controlled (HCC)   Hypotension   Sacral fracture (HCC)   Acute T6 compression fracture with large epidural hematoma -secondary to unwitnessed fall -seen by neurosurgery -non surgical management recommended with pain control, PT and TLSO brace -family in agreement to pursue non operative approach  Acute minimally displaced right sacral fracture.  -secondary to fall -images reviewed with Dr. Erlinda Hong, ortho, who recommended non operative management -WBAT, PT/OT  Hypotension, mild lactic acidosis -no clear source of infection -WBC count continues to rise -possibly stress response -continue on IV fluids -blood cultures haven't shown any growth as of yet -he is on empiric antibiotics for now -since he is afebrile and does not have a clear source of infection, will discontinue antibiotics for now and observe  AKI on CKD stage 3b -creatinine at 1.8 on admission -overall improved to 1.3 with IV fluids -continue to monitor  Paroxysmal A fib -continue metoprolol for rate control -hold anticoagulation with epidural hematoma -per wife, patient has not been taking any anticoagulation in the last 3 months  DM2, insulin dependent -continued on basal insulin -continue on SSI -blood sugars reasonably controlled -A1c 8.4  Pulmonary nodule -noted on CT, can consider follow up if  desired  Bilateral subdural hygromas -likely chronic subdural hematomas -no midline shift  Goals of care -patient's wife had reported that patient had not been eating and drinking well for quite some time now -Palliative care consulted to help with discussions around goals of care and plans moving forward -patient appears to be a reasonable candidate for hospice -will also ask for assistance with pain management.   DVT prophylaxis: Place and maintain sequential compression device Start: 09/29/20 2128  Code Status: DNR Family Communication: discussed with wife at the bedside Disposition Plan: Status is: Inpatient  The patient will require care spanning > 2 midnights and should be moved to inpatient because: Ongoing active pain requiring inpatient pain management  Dispo: The patient is from: Home              Anticipated d/c is to:  TBD              Patient currently is not medically stable to d/c.   Difficult to place patient No   Consultants:   Neurosurgery  Palliative care  Procedures:     Antimicrobials:   vancomycin 3/31>4/3  Zosyn 3/31>4/1  Ceftriaxone 4/1>4/3   Subjective: Feels that pain is unchanged when compared to yesterday. Has to lay flat, raising the head of bed causes pain  Objective: Vitals:   10/01/20 2000 10/02/20 0337 10/02/20 0719 10/02/20 1100  BP:  (!) 169/88 (!) 145/84 139/77  Pulse:   81 69  Resp:  $Remo'20 20 17  'dvqIu$ Temp: 97.8 F (36.6 C) 98.8 F (37.1 C) 98.1 F (36.7 C) 98 F (36.7 C)  TempSrc: Oral Oral Oral Oral  SpO2: 93%  96% 100%  Weight:  Height:        Intake/Output Summary (Last 24 hours) at 10/02/2020 1303 Last data filed at 10/02/2020 0800 Gross per 24 hour  Intake 800 ml  Output 725 ml  Net 75 ml   Filed Weights   09/29/20 1341  Weight: 90 kg    Examination:  General exam: Alert, awake, oriented x 3 Respiratory system: Clear to auscultation. Respiratory effort normal. Cardiovascular system:RRR. No murmurs,  rubs, gallops. Gastrointestinal system: Abdomen is nondistended, soft and nontender. No organomegaly or masses felt. Normal bowel sounds heard. Central nervous system: Alert and oriented. No focal neurological deficits. Extremities: No C/C/E, +pedal pulses Skin: No rashes, lesions or ulcers Psychiatry: Judgement and insight appear normal. Mood & affect appropriate.    Data Reviewed: I have personally reviewed following labs and imaging studies  CBC: Recent Labs  Lab 09/29/20 1417 09/30/20 0521 10/01/20 0429 10/02/20 0329  WBC 16.4* 19.5* 21.3* 17.1*  NEUTROABS 13.6*  --   --   --   HGB 13.4 12.8* 12.7* 12.7*  HCT 42.5 40.0 39.3 39.3  MCV 99.3 97.3 96.1 97.0  PLT 159 127* 108* 69*   Basic Metabolic Panel: Recent Labs  Lab 09/29/20 1417 09/30/20 0500 10/01/20 0429 10/02/20 0329  NA 140 140 137 139  K 4.1 4.9 4.3 4.1  CL 108 109 106 107  CO2 25 21* 22 26  GLUCOSE 165* 341* 295* 168*  BUN 28* 32* 32* 25*  CREATININE 1.53* 1.84* 1.49* 1.30*  CALCIUM 9.1 8.8* 8.7* 8.8*   GFR: Estimated Creatinine Clearance: 38.1 mL/min (A) (by C-G formula based on SCr of 1.3 mg/dL (H)). Liver Function Tests: Recent Labs  Lab 10/01/20 0429  AST 57*  ALT 37  ALKPHOS 75  BILITOT 0.8  PROT 5.8*  ALBUMIN 2.8*   No results for input(s): LIPASE, AMYLASE in the last 168 hours. No results for input(s): AMMONIA in the last 168 hours. Coagulation Profile: No results for input(s): INR, PROTIME in the last 168 hours. Cardiac Enzymes: No results for input(s): CKTOTAL, CKMB, CKMBINDEX, TROPONINI in the last 168 hours. BNP (last 3 results) No results for input(s): PROBNP in the last 8760 hours. HbA1C: Recent Labs    09/29/20 2330  HGBA1C 8.4*   CBG: Recent Labs  Lab 10/01/20 1123 10/01/20 1623 10/01/20 2138 10/02/20 0803 10/02/20 1105  GLUCAP 210* 174* 165* 148* 129*   Lipid Profile: No results for input(s): CHOL, HDL, LDLCALC, TRIG, CHOLHDL, LDLDIRECT in the last 72  hours. Thyroid Function Tests: No results for input(s): TSH, T4TOTAL, FREET4, T3FREE, THYROIDAB in the last 72 hours. Anemia Panel: No results for input(s): VITAMINB12, FOLATE, FERRITIN, TIBC, IRON, RETICCTPCT in the last 72 hours. Sepsis Labs: Recent Labs  Lab 09/29/20 1608 09/29/20 2330 09/30/20 0521  PROCALCITON  --  0.56  --   LATICACIDVEN 2.1*  --  2.7*    Recent Results (from the past 240 hour(s))  Culture, blood (routine x 2)     Status: None (Preliminary result)   Collection Time: 09/29/20  3:59 PM   Specimen: BLOOD  Result Value Ref Range Status   Specimen Description   Final    BLOOD RIGHT WRIST Performed at [facility] [address], [place], [place] [address]    Special Requests   Final    BOTTLES DRAWN AEROBIC AND ANAEROBIC Blood Culture results may not be optimal due to an inadequate volume of blood received in culture bottles Performed at [facility] [address]  [place], [place] [address]    Culture   Final    NO GROWTH 3 DAYS Performed at [facility], [place] [address], [place], [place] [address]    Report Status PENDING  Incomplete  Culture, blood (routine x 2)     Status: None (Preliminary result)   Collection Time: 09/29/20  4:09 PM   Specimen: BLOOD  Result Value Ref Range Status   Specimen Description   Final    BLOOD RIGHT ANTECUBITAL Performed at [facility] [address] [place], [place] [address]    Special Requests   Final    BOTTLES DRAWN AEROBIC AND ANAEROBIC Blood Culture results may not be optimal due to an inadequate volume of blood received in culture bottles Performed at [facility] [address] [place], [place] [address]    Culture   Final    NO GROWTH 3 DAYS Performed at [facility], [place] [address], [place], [place] [address]    Report Status PENDING  Incomplete  Resp Panel by RT-PCR (Flu A&B, Covid) Nasopharyngeal Swab      Status: None   Collection Time: 09/29/20  8:46 PM   Specimen: Nasopharyngeal Swab; Nasopharyngeal(NP) swabs in vial transport medium  Result Value Ref Range Status   SARS Coronavirus 2 by RT PCR NEGATIVE NEGATIVE Final    Comment: (NOTE) SARS-CoV-2 target nucleic acids are NOT DETECTED.  The SARS-CoV-2 RNA is generally detectable in upper respiratory specimens during the acute phase of infection. The lowest concentration of SARS-CoV-2 viral copies this assay can detect is 138 copies/mL. A negative result does not preclude SARS-Cov-2 infection and should not be used as the sole basis for treatment or other patient management decisions. A negative result may occur with  improper specimen collection/handling, submission of specimen other than nasopharyngeal swab, presence of viral mutation(s) within the areas targeted by this assay, and inadequate number of viral copies(<138 copies/mL). A negative result must be combined with clinical observations, patient history, and epidemiological information. The expected result is Negative.  Fact Sheet for Patients:  EntrepreneurPulse.com.au  Fact Sheet for Healthcare Providers:  IncredibleEmployment.be  This test is no t yet approved or cleared by the [place] FDA and  has been authorized for detection and/or diagnosis of SARS-CoV-2 by FDA under an Emergency Use Authorization (EUA). This EUA will remain  in effect (meaning this test can be used) for the duration of the COVID-19 declaration under Section 564(b)(1) of the Act, 21 U.S.C.section 360bbb-3(b)(1), unless the authorization is terminated  or revoked sooner.       Influenza A by PCR NEGATIVE NEGATIVE Final   Influenza B by PCR NEGATIVE NEGATIVE Final    Comment: (NOTE) The Xpert Xpress SARS-CoV-2/FLU/RSV plus assay is intended as an aid in the diagnosis of influenza from Nasopharyngeal swab specimens and should not be used as a sole basis  for treatment. Nasal washings and aspirates are unacceptable for Xpert Xpress SARS-CoV-2/FLU/RSV testing.  Fact Sheet for Patients: EntrepreneurPulse.com.au  Fact Sheet for Healthcare Providers: IncredibleEmployment.be  This test is not yet approved or cleared by the [place] FDA and has been authorized for detection and/or diagnosis of SARS-CoV-2 by FDA under an Emergency Use Authorization (EUA). This EUA will remain in effect (meaning this test can be used) for the duration of the COVID-19 declaration under Section 564(b)(1) of the Act, 21 U.S.C. section 360bbb-3(b)(1), unless the authorization is terminated or revoked.  Performed at [facility], [place] [address] [place], [place] [address]   MRSA PCR Screening     Status: None   Collection Time: 09/30/20  2:21 AM   Specimen: Nasal Mucosa; Nasopharyngeal  Result Value Ref Range Status   MRSA by PCR NEGATIVE NEGATIVE Final    Comment:        The GeneXpert MRSA Assay (FDA approved for NASAL specimens only), is one component of a comprehensive MRSA colonization surveillance program. It is not intended to diagnose MRSA infection nor to guide or monitor treatment for MRSA infections. Performed at [facility], [place] [address] [place], [place] [address]          Radiology Studies: No results found.      Scheduled Meds: . atorvastatin  40 mg Oral Daily  . cycloSPORINE  2 drop Both Eyes BID  . docusate sodium  100 mg Oral Daily  . insulin aspart  0-20 Units Subcutaneous TID WC  . insulin aspart  0-5 Units Subcutaneous QHS  . insulin detemir  14 Units Subcutaneous QHS  . metoprolol tartrate  25 mg Oral BID  . oxyCODONE  5 mg Oral BID  . pantoprazole  40 mg Oral Daily  . timolol  1 drop Left Eye BID   Continuous Infusions: . lactated ringers 100 mL/hr at 10/01/20 2002     LOS: 2 days    Time spent: 17mins    Kathie Dike, MD [facility]   If 7PM-7AM, please contact night-coverage [email or web address]  10/02/2020, 1:03 PM

## 2020-10-02 NOTE — Consult Note (Signed)
Consultation Note Date: 10/02/2020   Patient Name: Victor Wallace  DOB: 04/08/1925  MRN: [id number]  Age / Sex: 87 y.o., male  PCP: Tisovec, Fransico Him, MD Referring Physician: Kathie Dike, MD  Reason for Consultation: Establishing goals of care and Hospice Evaluation  HPI/Patient Profile: 87 y.o. male  with past medical history of  DM2, Atrial fibrillation, CKD 3, CAD, kidiney stones and progressive memory decline for several years who was admitted on 09/29/2020 after a fall.  He sustained a SDH, an acute T6 compression fracture and a minimally displaced sacral fracture.  His albumin is 2.8.  He was dependent for ADLs at home prior to admission.   Clinical Assessment and Goals of Care:  I have reviewed medical records including EPIC notes, labs and imaging, received report from Dr. Roderic Palau, examined the patient and met at bedside with the patient and his wife Victor Wallace  to discuss diagnosis prognosis, [place], EOL wishes, disposition and options.  I introduced Palliative Medicine as specialized medical care for people living with serious illness. It focuses on providing relief from the symptoms and stress of a serious illness.   We discussed a brief life review of the patient.  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  He was able to retire early in his 30s.  He lost his first wife to cancer.  They had had 1 son who has remained somewhat distant from [place] since his marriage to [place].  Victor Wallace has known Victor Wallace since she was very young they are 30 years apart.  Now they have been married for over 30 years.  She and Victor Wallace used to enjoy travelling.  Victor Wallace loves [employer].  He is [place].  Victor Wallace has become his care taker.  He fractured a hip before they were married and over the years it has become very painful.  He is able to get from the  bed to the bathroom with an RW but outside of the house he uses an Transport planner.  Victor Wallace describes bouts over very low blood pressure over the years - I expressed that I wonder if that contributed to the dementia he has developed.  As far as functional and nutritional status PTA he was dependent for ADLs, was essentially wheel chair bound, and had developed dementia.  We discussed his current illness and what it means in the larger context of his on-going co-morbidities.  Natural disease trajectory and expectations at EOL were discussed.  Victor Wallace suffers excruciating pain with any movement despite his TSLO body brace.  He has eaten barely bites and sips since admission to the hospital.  I talked with Victor Wallace about the need to treat his pain to prevent suffering and that it would likely cause him to sleep most all of the time.  I expressed that when he is off of artificial hydration I doubt he will wake much or drink enough to sustain himself.  Victor Wallace understands.  Ideally she would like to take him home but is scared of not being able to safely handle him without causing severe pain.  Victor Wallace's son lives at home but is gone during the day and Victor Wallace herself had a mastectomy for breast cancer not long ago.  Hospice and Palliative Care services outpatient were explained and offered.  We talked about Hospice in her home versus [place].  Victor Wallace lives near Victor Wallace.  [facility] would be the closest facility to her.  Ideally she would like to take him home - but she's just uncertain about being able to properly care for him.  She is struggling with this decision.   I asked her to sleep on in this evening and Victor Wallace can talk with her tomorrow to help with this decision.  Questions and concerns were addressed.  The family was encouraged to call with questions or concerns.    Primary Decision Maker:  Wife, Victor Wallace    SUMMARY OF RECOMMENDATIONS     Comfort Care  Fentanyl  patch added for pain in addition to oxycodone.  Will reassess on 4/4 - will likely need adjustment to pain regimen  Patient with excruciating pain with any movement.  Victor Wallace would like to take him home - but has concerns about managing him alone.     As he is taking only bites and sips and is barely waking - I believe he is appropriate for Hospice Facility or Hospice in the Home.     Without artificial hydration he will decline very quickly  TOC order placed.  Discussed with [facility] who will talk with Victor Wallace on 4/4.   Code Status/Advance Care Planning:  DNR   Symptom Management:   Fentanyl patch, and oxycodone.   This regimen will likely need adjustment.   Patient needs IV pushes of opioid prior to any movement  Additional Recommendations (Limitations, Scope, Preferences):  Full Comfort Care  Palliative Prophylaxis:   Frequent Pain Assessment  Psycho-social/Spiritual:   Desire for further Chaplaincy support: welcomed Christian  Prognosis:  Less than two weeks with proper pain control and without IV hydration.    Discharge Planning: Hospice at home or Hospice facility.      Primary Diagnoses: Present on Admission: . Thoracic compression fracture ([place]) . Hypotension . Hyperlipidemia . Type 2 diabetes mellitus with renal manifestations, controlled ([place])   I have reviewed the medical record, interviewed the patient and family, and examined the patient. The following aspects are pertinent.  Past Medical History:  Diagnosis Date  . CAD (coronary artery disease), native coronary artery    Catheterization 2008 showing moderate nonobstructive disease in LAD, circumflex and right coronary artery, no lesions greater than 40%.  EF of 35% at that time.   Marland Kitchen GERD (gastroesophageal reflux disease)   . History of kidney stones   . Hypercholesterolemia   . Hyperlipidemia 12/02/2008  . Hypertension   . Kidney disease, chronic, stage III (GFR 30-59 ml/min) ([place]) 08/29/2014  .  Paroxysmal atrial fibrillation ([facility]) 08/29/2014   Chads2VASC score 5   . Transient global amnesia 2008   . Type 2 diabetes mellitus with renal manifestations, controlled ([place])    Social History   Socioeconomic History  . Marital status: Married    Spouse name: Not on file  . Number of children: Not on file  . Years of education: Not on file  . Highest education Victor: Not on file  Occupational History  . Not on file  Tobacco Use  . Smoking status: Never Smoker  . Smokeless tobacco: Never Used  Substance and Sexual Activity  . Alcohol use: Yes    Alcohol/week: 1.0 standard drink    Types: 1 Cans of beer per week    Comment: occasionally  . Drug use: No  . Sexual activity: Never  Other Topics Concern  . Not on file  Social History Narrative   He is a retired Water quality scientist at the [employer]   Social Determinants of Radio broadcast assistant Strain: Not on [employer] Insecurity: Not on file  Transportation Needs: Not on file  Physical Activity: Not on file  Stress: Not on file  Social Connections: Not on file   Family History  Problem Relation Age of Onset  . Heart attack Father   . Emphysema Brother     Allergies  Allergen Reactions  . Shirley Friar [Lifitegrast] Other (See Comments)    Made the eyes burn      Vital Signs: BP 139/77 (BP Location: Left Arm)   Pulse 69   Temp 98 F (36.7 C) (Oral)   Resp 17   Ht 6' (1.829 m)   Wt 90 kg   SpO2 100%   BMI 26.91 kg/m  Pain Scale: PAINAD   Pain Score: 0-No pain   SpO2: SpO2: 100 % O2 Device:SpO2: 100 % O2 Flow Rate: .O2 Flow Rate (L/min): 2 L/min    Palliative Assessment/Data: 20%     Time In: 1:00 Time Out: 3:00 Time Total: 120 min Visit consisted of counseling and education dealing with the complex and emotionally intense issues surrounding the need for palliative care and symptom management in the setting of serious and potentially life-threatening illness. Greater than  50%  of this time was spent counseling and coordinating care related to the above assessment and plan.  Signed by: Florentina Jenny, PA-C Palliative Medicine  Please contact Palliative Medicine Team phone at [phone] for questions and concerns.  For individual provider: See Shea Evans

## 2020-10-03 DIAGNOSIS — Z515 Encounter for palliative care: Secondary | ICD-10-CM | POA: Diagnosis not present

## 2020-10-03 DIAGNOSIS — W19XXXA Unspecified fall, initial encounter: Secondary | ICD-10-CM | POA: Diagnosis not present

## 2020-10-03 DIAGNOSIS — E785 Hyperlipidemia, unspecified: Secondary | ICD-10-CM | POA: Diagnosis not present

## 2020-10-03 DIAGNOSIS — S22050A Wedge compression fracture of T5-T6 vertebra, initial encounter for closed fracture: Secondary | ICD-10-CM | POA: Diagnosis not present

## 2020-10-03 DIAGNOSIS — S32121A Minimally displaced Zone II fracture of sacrum, initial encounter for closed fracture: Secondary | ICD-10-CM | POA: Diagnosis not present

## 2020-10-03 DIAGNOSIS — S22052A Unstable burst fracture of T5-T6 vertebra, initial encounter for closed fracture: Secondary | ICD-10-CM | POA: Diagnosis not present

## 2020-10-03 LAB — GLUCOSE, CAPILLARY
Glucose-Capillary: 120 mg/dL — ABNORMAL HIGH (ref 70–99)
Glucose-Capillary: 140 mg/dL — ABNORMAL HIGH (ref 70–99)

## 2020-10-03 MED ORDER — OXYCODONE HCL 5 MG PO TABS: 10.0000 mg | ORAL_TABLET | Freq: Four times a day (QID) | ORAL | Status: DC | PRN

## 2020-10-03 MED ORDER — HALOPERIDOL LACTATE 2 MG/ML PO CONC
0.5000 mg | ORAL | Status: DC | PRN
  Filled 2020-10-03: qty 0.3

## 2020-10-03 MED ORDER — GLYCOPYRROLATE 0.2 MG/ML IJ SOLN: 0.2000 mg | INTRAMUSCULAR | Status: DC | PRN

## 2020-10-03 MED ORDER — ONDANSETRON 4 MG PO TBDP: 4.0000 mg | ORAL_TABLET | Freq: Four times a day (QID) | ORAL | Status: DC | PRN

## 2020-10-03 MED ORDER — SENNA 8.6 MG PO TABS
2.0000 | ORAL_TABLET | Freq: Every day | ORAL | Status: DC
  Administered 2020-10-03: 17.2 mg via ORAL
  Filled 2020-10-03: qty 2

## 2020-10-03 MED ORDER — HALOPERIDOL LACTATE 5 MG/ML IJ SOLN: 0.5000 mg | INTRAMUSCULAR | Status: DC | PRN

## 2020-10-03 MED ORDER — MORPHINE SULFATE (PF) 2 MG/ML IV SOLN
2.0000 mg | INTRAVENOUS | Status: DC | PRN
  Administered 2020-10-04: 2 mg via INTRAVENOUS
  Filled 2020-10-03: qty 1

## 2020-10-03 MED ORDER — ONDANSETRON HCL 4 MG/2ML IJ SOLN: 4.0000 mg | Freq: Four times a day (QID) | INTRAMUSCULAR | Status: DC | PRN

## 2020-10-03 MED ORDER — OXYCODONE HCL 5 MG PO TABS
10.0000 mg | ORAL_TABLET | Freq: Two times a day (BID) | ORAL | Status: AC
  Administered 2020-10-03 (×2): 10 mg via ORAL
  Filled 2020-10-03 (×2): qty 2

## 2020-10-03 MED ORDER — BIOTENE DRY MOUTH MT LIQD: 15.0000 mL | OROMUCOSAL | Status: DC | PRN

## 2020-10-03 MED ORDER — LORAZEPAM 2 MG/ML IJ SOLN: 0.5000 mg | INTRAMUSCULAR | Status: DC | PRN

## 2020-10-03 MED ORDER — GLYCOPYRROLATE 1 MG PO TABS: 1.0000 mg | ORAL_TABLET | ORAL | Status: DC | PRN

## 2020-10-03 MED ORDER — HALOPERIDOL 1 MG PO TABS
0.5000 mg | ORAL_TABLET | ORAL | Status: DC | PRN
Start: 2020-10-03 — End: 2020-10-05

## 2020-10-03 MED ORDER — POLYVINYL ALCOHOL 1.4 % OP SOLN: 1.0000 [drp] | Freq: Four times a day (QID) | OPHTHALMIC | Status: DC | PRN

## 2020-10-03 NOTE — Progress Notes (Signed)
Daily Progress Note   Patient Name: Victor Wallace       Date: 10/03/2020 DOB: 1924/10/21  Age: 87 y.o. MRN#: [id number] Attending Physician: Kathie Dike, MD Primary Care Physician: Haywood Pao, MD Admit Date: 09/29/2020  Reason for Consultation/Follow-up:  To discuss complex medical decision making related to patient's goals of care  Subjective: I visited with [name] and [name] at bedside.  [name] is sound asleep today.   [name] tells me the last thing he ate was 4 bites of oatmeal yesterday morning.  He is not interested in food.  He has not been drinking but is on IVF.  [name] describes a sudden bout of agitation around 2:00 am.  [name] and the RN tried to discern what was wrong with him.  [name] could not get him to focus or recognize his name.   At some point he pulled her face close to him and told her he loved her.  I expressed to [name] that I'm worried he is nearing end of life.  She agreed and felt that he would not want to live this way.   Assessment: 87 yo male previously almost wheelchair bound, dependent for ADLs and suffering with dementia.  Now with intractable pain from SDH, hip fracture and T6 fracture.  He has stopped eating.  Sleeping 90% of the time possibly due to necessary pain medications.  He suffers with severe pain with any movement of his thorax.   Patient Profile/HPI:  87 y.o. male  with past medical history of  DM2, Atrial fibrillation, CKD 3, CAD, kidiney stones and progressive memory decline for several years who was admitted on 09/29/2020 after a fall.  He sustained a SDH, an acute T6 compression fracture and a minimally displaced sacral fracture.  His albumin is 2.8.  He was dependent for ADLs at home prior to admission.      Length of Stay: 3   Vital  Signs: BP (!) 172/94 (BP Location: Right Arm)   Pulse 86   Temp 97.6 F (36.4 C) (Oral)   Resp 20   Ht 6' (1.829 m)   Wt 90 kg   SpO2 96%   BMI 26.91 kg/m  SpO2: SpO2: 96 % O2 Device: O2 Device: Nasal Cannula O2 Flow Rate: O2 Flow Rate (L/min): 3 L/min  Palliative Assessment/Data: 10%     Palliative Care Plan    Recommendations/Plan:  Continue current care.    On fentanyl patch and PRN oxy for pain.  Please give IV dose of morphine prior to any movement (bath, change, turn)  Talked with [name] of [facility].  She knows the details of the situation and will talk with [name] shortly.  Code Status:  DNR  Prognosis:   < 2 weeks   Discharge Planning:  Hospice facility vs Home with hospice.  Family prefers [name] if they opt for facility.  Care plan was discussed with wife and [facility].  Thank you for allowing the Palliative Medicine Team to assist in the care of this patient.  Total time spent:  35 min.     Greater than 50%  of this time was spent counseling and coordinating care related to the above assessment and plan.  Florentina Jenny, PA-C Palliative Medicine  Please contact Palliative MedicineTeam phone at [phone] for questions and concerns between 7 am - 7 pm.   Please see AMION for individual provider pager numbers.

## 2020-10-03 NOTE — TOC Initial Note (Addendum)
Transition of Care (TOC) - Initial/Assessment Note  Marvetta Gibbons RN,BSN Transitions of Care Unit 4NP (non trauma) - RN Case Manager See Treatment Team for direct Phone #   Patient Details  Name: Victor Wallace MRN: [id number] Date of Birth: [birth year]  Transition of Care [facility]) CM/SW Contact:    Dawayne Patricia, RN Phone Number: 10/03/2020, 1:57 PM  Clinical Narrative:                 Noted referral for Hospice needs, Home Hospice vs Residential Hospice- Call made to Cheri with [facility] with request for her to reach out to wife to further discuss and provide info for [facility] will do chart review for Hospice eligibility and then reach out to wife to discuss Hospice services and best plan to provide level of care pt will need.  TOC to follow for final plan that wife chooses.   1530- Call received from [facility]- wife has selected the [facility] and pt has been approved under Hospice eligibility. Per Ramiro Harvest they have a bed available and can either take pt today or tomorrow. Msg sent to MD and will plan for pt to transition to the [facility] updated.   Expected Discharge Plan: Home w Hospice Care Barriers to Discharge: Continued Medical Work up   Patient Goals and CMS Choice Patient states their goals for this hospitalization and ongoing recovery are:: Hospice/comfort CMS Medicare.gov Compare Post Acute Care list provided to:: Patient Represenative (must comment) Choice offered to / list presented to : Spouse  Expected Discharge Plan and Services Expected Discharge Plan: [place]   Discharge Planning Services: CM Consult   Living arrangements for the past 2 months: [place] Date [place]: 10/03/20 Time HH Agency Contacted: 1100 Representative spoke  with at [place]: [place] Arrangements/Services Living arrangements for the past 2 months: [place] with:: Spouse Patient language and need for interpreter reviewed:: Yes        Need for Family Participation in Patient Care: Yes (Comment) Care giver support system in place?: Yes (comment) Current home services: DME Criminal Activity/Legal Involvement Pertinent to Current Situation/Hospitalization: No - Comment as needed  Activities of Daily Living Home Assistive Devices/Equipment: Shower chair with back,Walker (specify type),Wheelchair,Eyeglasses,Bedside commode/3-in-1 (lift chair, craftmatic bed) ADL Screening (condition at time of admission) Patient's cognitive ability adequate to safely complete daily activities?: No Is the patient deaf or have difficulty hearing?: Yes Does the patient have difficulty seeing, even when wearing glasses/contacts?: No Does the patient have difficulty concentrating, remembering, or making decisions?: Yes Patient able to express need for assistance with ADLs?: No Does the patient have difficulty dressing or bathing?: Yes Independently performs ADLs?: No Communication: Independent Dressing (OT): Needs assistance Is this a change from baseline?: Pre-admission baseline Grooming: Needs assistance Is this a change from baseline?: Pre-admission baseline Feeding: Needs assistance Is this a change from baseline?: Pre-admission baseline Bathing: Needs assistance Is this a change from baseline?: Pre-admission baseline Toileting: Needs assistance Is this a change from baseline?: Pre-admission baseline In/Out Bed: Needs assistance Is this a change from baseline?: Pre-admission baseline Walks in Home: Dependent Is this a change from baseline?: Pre-admission baseline  Does the patient have difficulty walking or climbing stairs?: Yes Weakness of Legs: Both Weakness of Arms/Hands: Both  Permission Sought/Granted   Permission  granted to share information with : Yes, Verbal Permission Granted     Permission granted to share info w AGENCY: Hospice        Emotional Assessment Appearance:: Appears stated age Attitude/Demeanor/Rapport: Unable to Assess Affect (typically observed): Unable to Assess   Alcohol / Substance Use: Not Applicable Psych Involvement: No (comment)  Admission diagnosis:  Back pain [M54.9] Thoracic compression fracture ([place]) [S22.000A] Closed minimally displaced zone II fracture of sacrum, initial encounter ([place]) [S32.121A] Fall, initial encounter [W19.XXXA] Closed fracture of fourth thoracic vertebra, unspecified fracture morphology, initial encounter [facility]) [K35.248L] Patient Active Problem List   Diagnosis Date Noted  . Thoracic compression fracture ([place]) 09/29/2020  . Sacral fracture ([place]) 09/29/2020  . Pain in joint of right shoulder 01/07/2020  . Labile blood pressure 08/24/2019  . DOE (dyspnea on exertion) 08/24/2019  . UTI (urinary tract infection) 04/29/2015  . SIRS (systemic inflammatory response syndrome) ([place]) 04/29/2015  . Bronchitis, chronic obstructive w acute bronchitis ([place]) 04/29/2015  . Arterial hypotension   . Hypotension 04/28/2015  . Chronic anticoagulation 09/21/2014  . Kidney disease, chronic, stage III (GFR 30-59 ml/min) (H[facility]) 08/29/2014  . Paroxysmal atrial fibrillation ([place]) 08/29/2014  . Hypertension 08/29/2014  . Unstable angina pectoris ([place]) 08/29/2014  . History of kidney stones   . GERD (gastroesophageal reflux disease) 01/31/2012  . Type 2 diabetes mellitus with renal manifestations, controlled ([place])   . Hyperlipidemia 12/02/2008  . Nonocclusive coronary atherosclerosis of native coronary artery    PCP:  Tisovec, Fransico Him, MD Pharmacy:   [id number] [place], [place] - [address] [place] [address] Phone: [phone] Fax: [phone]  [facility] [place], [place] [address] [place] [address] Phone: [phone] Fax: [phone]     Social Determinants of Health (SDOH) Interventions    Readmission Risk Interventions No flowsheet data found.

## 2020-10-03 NOTE — Progress Notes (Addendum)
PROGRESS NOTE    Victor Wallace  FGB:[id number] DOB: 1925-04-16 DOA: 09/29/2020 PCP: Haywood Pao, MD    Brief Narrative:  87 y/o male admitted to the hospital after a fall, and found to have an acute sacral fracture and acute T6 compression fracture with large epidural hematoma. Seen by neurosurgery and nonoperative management recommended. Pain is currently uncontrolled. Palliative care consulted to help address goals of care.   Assessment & Plan:   Principal Problem:   Thoracic compression fracture (HCC) Active Problems:   Hyperlipidemia   Type 2 diabetes mellitus with renal manifestations, controlled (HCC)   Hypotension   Sacral fracture (HCC)   Acute T6 compression fracture with large epidural hematoma -secondary to unwitnessed fall -seen by neurosurgery -non surgical management recommended with pain control, PT and TLSO brace -family in agreement to pursue non operative approach  Acute minimally displaced right sacral fracture.  -secondary to fall -images reviewed with Dr. Erlinda Hong, ortho, who recommended non operative management -WBAT  Pain management -appreciate palliative assistance -still has uncontrolled pain -started on fentanyl patch -use morphine for breakthrough  Hypotension, mild lactic acidosis -no clear source of infection -WBC count continues to rise -possibly stress response -blood cultures haven't shown any growth as of yet -he was on empiric antibiotics -since he is afebrile and does not have a clear source of infection, antibiotics discontinued  AKI on CKD stage 3b -creatinine at 1.8 on admission -overall improved to 1.3 with IV fluids  Paroxysmal A fib -He was receiving metoprolol for rate control -hold anticoagulation with epidural hematoma -per wife, patient has not been taking any anticoagulation in the last 3 months  DM2, insulin dependent -stopped insulin and CBG checks since transition to comfort care -A1c 8.4  Pulmonary  nodule -noted on CT, can consider follow up if desired  Bilateral subdural hygromas -likely chronic subdural hematomas -no midline shift  Goals of care -patient's wife had reported that patient had not been eating and drinking well for quite some time now -Palliative care consulted to help with discussions around goals of care and plans moving forward -patient appears to be a reasonable candidate for hospice -After meeting with palliative care, patient's wife has expressed interest in residential hospice in [place] has been contact and will get in touch with patient's wife   DVT prophylaxis:   Code Status: DNR, comfort measures Family Communication: discussed with wife at the bedside Disposition Plan: Status is: Inpatient  The patient will require care spanning > 2 midnights and should be moved to inpatient because: Ongoing active pain requiring inpatient pain management  Dispo: The patient is from: Home              Anticipated d/c is to:  TBD              Patient currently is medically stable to d/c.   Difficult to place patient No   Consultants:   Neurosurgery  Palliative care  Procedures:     Antimicrobials:   vancomycin 3/31>4/3  Zosyn 3/31>4/1  Ceftriaxone 4/1>4/3   Subjective: Patient is sleeping, does not wake up to voice  Objective: Vitals:   10/02/20 2131 10/03/20 0000 10/03/20 0401 10/03/20 0730  BP: (!) 176/90  (!) 177/86 (!) 172/94  Pulse: 85  88 86  Resp:   19 20  Temp:  98 F (36.7 C) 98.4 F (36.9 C) 97.6 F (36.4 C)  TempSrc:  Oral Oral Oral  SpO2:    96%  Weight:      Height:        Intake/Output Summary (Last 24 hours) at 10/03/2020 1409 Last data filed at 10/03/2020 0930 Gross per 24 hour  Intake 2730.29 ml  Output 500 ml  Net 2230.29 ml   Filed Weights   09/29/20 1341  Weight: 90 kg    Examination:  General exam: sleeping Respiratory system: Clear to auscultation. Respiratory effort  normal. Cardiovascular system:RRR. No murmurs, rubs, gallops.     Data Reviewed: I have personally reviewed following labs and imaging studies  CBC: Recent Labs  Lab 09/29/20 1417 09/30/20 0521 10/01/20 0429 10/02/20 0329  WBC 16.4* 19.5* 21.3* 17.1*  NEUTROABS 13.6*  --   --   --   HGB 13.4 12.8* 12.7* 12.7*  HCT 42.5 40.0 39.3 39.3  MCV 99.3 97.3 96.1 97.0  PLT 159 127* 108* 69*   Basic Metabolic Panel: Recent Labs  Lab 09/29/20 1417 09/30/20 0500 10/01/20 0429 10/02/20 0329  NA 140 140 137 139  K 4.1 4.9 4.3 4.1  CL 108 109 106 107  CO2 25 21* 22 26  GLUCOSE 165* 341* 295* 168*  BUN 28* 32* 32* 25*  CREATININE 1.53* 1.84* 1.49* 1.30*  CALCIUM 9.1 8.8* 8.7* 8.8*   GFR: Estimated Creatinine Clearance: 38.1 mL/min (A) (by C-G formula based on SCr of 1.3 mg/dL (H)). Liver Function Tests: Recent Labs  Lab 10/01/20 0429  AST 57*  ALT 37  ALKPHOS 75  BILITOT 0.8  PROT 5.8*  ALBUMIN 2.8*   No results for input(s): LIPASE, AMYLASE in the last 168 hours. No results for input(s): AMMONIA in the last 168 hours. Coagulation Profile: No results for input(s): INR, PROTIME in the last 168 hours. Cardiac Enzymes: No results for input(s): CKTOTAL, CKMB, CKMBINDEX, TROPONINI in the last 168 hours. BNP (last 3 results) No results for input(s): PROBNP in the last 8760 hours. HbA1C: No results for input(s): HGBA1C in the last 72 hours. CBG: Recent Labs  Lab 10/02/20 1105 10/02/20 1625 10/02/20 2124 10/03/20 0413 10/03/20 0728  GLUCAP 129* 108* 123* 120* 140*   Lipid Profile: No results for input(s): CHOL, HDL, LDLCALC, TRIG, CHOLHDL, LDLDIRECT in the last 72 hours. Thyroid Function Tests: No results for input(s): TSH, T4TOTAL, FREET4, T3FREE, THYROIDAB in the last 72 hours. Anemia Panel: No results for input(s): VITAMINB12, FOLATE, FERRITIN, TIBC, IRON, RETICCTPCT in the last 72 hours. Sepsis Labs: Recent Labs  Lab 09/29/20 1608 09/29/20 2330  09/30/20 0521  PROCALCITON  --  0.56  --   LATICACIDVEN 2.1*  --  2.7*    Recent Results (from the past 240 hour(s))  Culture, blood (routine x 2)     Status: None (Preliminary result)   Collection Time: 09/29/20  3:59 PM   Specimen: BLOOD  Result Value Ref Range Status   Specimen Description   Final    BLOOD RIGHT WRIST Performed at [facility] [address] [place], [place] [address]    Special Requests   Final    BOTTLES DRAWN AEROBIC AND ANAEROBIC Blood Culture results may not be optimal due to an inadequate volume of blood received in culture bottles Performed at [facility] [address] [place], [place] [address]    Culture   Final    NO GROWTH 3 DAYS Performed at [facility], [place] [address], [place], [place] [address]    Report Status PENDING  Incomplete  Culture, blood (routine x 2)  Status: None (Preliminary result)   Collection Time: 09/29/20  4:09 PM   Specimen: BLOOD  Result Value Ref Range Status   Specimen Description   Final    BLOOD RIGHT ANTECUBITAL Performed at [facility] [address] [place], [place] [address]    Special Requests   Final    BOTTLES DRAWN AEROBIC AND ANAEROBIC Blood Culture results may not be optimal due to an inadequate volume of blood received in culture bottles Performed at [facility] [address], [place], [place] [address]    Culture   Final    NO GROWTH 3 DAYS Performed at [facility], [place] [address] [place], [place] [address]    Report Status PENDING  Incomplete  Resp Panel by RT-PCR (Flu A&B, Covid) Nasopharyngeal Swab     Status: None   Collection Time: 09/29/20  8:46 PM   Specimen: Nasopharyngeal Swab; Nasopharyngeal(NP) swabs in vial transport medium  Result Value Ref Range Status   SARS Coronavirus 2 by RT PCR NEGATIVE NEGATIVE Final    Comment: (NOTE) SARS-CoV-2 target nucleic acids are NOT DETECTED.  The  SARS-CoV-2 RNA is generally detectable in upper respiratory specimens during the acute phase of infection. The lowest concentration of SARS-CoV-2 viral copies this assay can detect is 138 copies/mL. A negative result does not preclude SARS-Cov-2 infection and should not be used as the sole basis for treatment or other patient management decisions. A negative result may occur with  improper specimen collection/handling, submission of specimen other than nasopharyngeal swab, presence of viral mutation(s) within the areas targeted by this assay, and inadequate number of viral copies(<138 copies/mL). A negative result must be combined with clinical observations, patient history, and epidemiological information. The expected result is Negative.  Fact Sheet for Patients:  EntrepreneurPulse.com.au  Fact Sheet for Healthcare Providers:  IncredibleEmployment.be  This test is no t yet approved or cleared by the [place] FDA and  has been authorized for detection and/or diagnosis of SARS-CoV-2 by FDA under an Emergency Use Authorization (EUA). This EUA will remain  in effect (meaning this test can be used) for the duration of the COVID-19 declaration under Section 564(b)(1) of the Act, 21 U.S.C.section 360bbb-3(b)(1), unless the authorization is terminated  or revoked sooner.       Influenza A by PCR NEGATIVE NEGATIVE Final   Influenza B by PCR NEGATIVE NEGATIVE Final    Comment: (NOTE) The Xpert Xpress SARS-CoV-2/FLU/RSV plus assay is intended as an aid in the diagnosis of influenza from Nasopharyngeal swab specimens and should not be used as a sole basis for treatment. Nasal washings and aspirates are unacceptable for Xpert Xpress SARS-CoV-2/FLU/RSV testing.  Fact Sheet for Patients: EntrepreneurPulse.com.au  Fact Sheet for Healthcare Providers: IncredibleEmployment.be  This test is not yet approved or  cleared by the [place] FDA and has been authorized for detection and/or diagnosis of SARS-CoV-2 by FDA under an Emergency Use Authorization (EUA). This EUA will remain in effect (meaning this test can be used) for the duration of the COVID-19 declaration under Section 564(b)(1) of the Act, 21 U.S.C. section 360bbb-3(b)(1), unless the authorization is terminated or revoked.  Performed at [facility], [place] [address], [place],  [address]   MRSA PCR Screening     Status: None   Collection Time: 09/30/20  2:21 AM   Specimen: Nasal Mucosa; Nasopharyngeal  Result Value Ref Range Status   MRSA by PCR NEGATIVE NEGATIVE Final    Comment:  The GeneXpert MRSA Assay (FDA approved for NASAL specimens only), is one component of a comprehensive MRSA colonization surveillance program. It is not intended to diagnose MRSA infection nor to guide or monitor treatment for MRSA infections. Performed at [facility], [place] [address] [place], [place] [address]          Radiology Studies: No results found.      Scheduled Meds: . atorvastatin  40 mg Oral Daily  . baclofen  10 mg Oral TID  . cycloSPORINE  2 drop Both Eyes BID  . docusate sodium  100 mg Oral Daily  . fentaNYL  1 patch Transdermal Q72H  . metoprolol tartrate  25 mg Oral BID  . oxyCODONE  10 mg Oral BID  . pantoprazole  40 mg Oral Daily  . senna  2 tablet Oral QHS  . timolol  1 drop Left Eye BID   Continuous Infusions: . lactated ringers 75 mL/hr at 10/03/20 0811     LOS: 3 days    Time spent: 7mins    Kathie Dike, MD [facility]   If 7PM-7AM, please contact night-coverage [email or web address]  10/03/2020, 2:09 PM

## 2020-10-03 NOTE — Care Management Important Message (Signed)
Important Message  Patient Details  Name: Victor Wallace MRN: [id number] Date of Birth: 08-13-1924   Medicare Important Message Given:  Yes     Orbie Pyo 10/03/2020, 2:12 PM

## 2020-10-04 DIAGNOSIS — S22052A Unstable burst fracture of T5-T6 vertebra, initial encounter for closed fracture: Secondary | ICD-10-CM | POA: Diagnosis not present

## 2020-10-04 DIAGNOSIS — I959 Hypotension, unspecified: Secondary | ICD-10-CM | POA: Diagnosis not present

## 2020-10-04 DIAGNOSIS — Z515 Encounter for palliative care: Secondary | ICD-10-CM

## 2020-10-04 DIAGNOSIS — S22050D Wedge compression fracture of T5-T6 vertebra, subsequent encounter for fracture with routine healing: Secondary | ICD-10-CM | POA: Diagnosis not present

## 2020-10-04 LAB — CULTURE, BLOOD (ROUTINE X 2)
Culture: NO GROWTH
Culture: NO GROWTH

## 2020-10-04 MED ORDER — BACLOFEN 1 MG/ML ORAL SUSPENSION: 10.0000 mg | Freq: Three times a day (TID) | ORAL | 0 refills | Status: AC

## 2020-10-04 MED ORDER — MORPHINE SULFATE (CONCENTRATE) 10 MG/0.5ML PO SOLN: 10.0000 mg | ORAL | 0 refills | Status: AC | PRN

## 2020-10-04 MED ORDER — FENTANYL 12 MCG/HR TD PT72: 1.0000 | MEDICATED_PATCH | TRANSDERMAL | 0 refills | Status: AC

## 2020-10-04 NOTE — Progress Notes (Signed)
Report called to RN Anderson Malta at [facility]

## 2020-10-04 NOTE — TOC Transition Note (Addendum)
Transition of Care (TOC) - CM/SW Discharge Note Marvetta Gibbons RN,BSN Transitions of Care Unit 4NP (non trauma) - RN Case Manager See Treatment Team for direct Phone #   Patient Details  Name: Victor Wallace MRN: [id number] Date of Birth: September 08, 1924  Transition of Care [facility]) CM/SW Contact:  Dawayne Patricia, RN Phone Number: 10/04/2020, 1:50 PM   Clinical Narrative:    Notified this am that pt has been accepted by [facility] and has bed available today for admit.  67- family has completed paperwork and Hospice is ready for pt to transport to [facility]- [address] [place] Alert  Number for bedside RN to call report to is [phone]  [facility] DNR has been signed, paperwork for transport placed on shadow chart.   [facility] has been called for transport to [facility]   Final next level of care: Home/Self Care Barriers to Discharge: Barriers Resolved   Patient Goals and CMS Choice Patient states their goals for this hospitalization and ongoing recovery are:: Hospice/comfort CMS Medicare.gov Compare Post Acute Care list provided to:: Patient Represenative (must comment) Choice offered to / list presented to : Spouse  Discharge Placement               Residential Hospice Home        Discharge Plan and Services   Discharge Planning Services: CM Consult Post Acute Care Choice: Hospice                      [facility] Agency: [place] Date [place]: 10/03/20 Time [place]: 1100 Representative spoke with at [place]: [place] ([place]) Interventions     Readmission Risk Interventions Readmission Risk Prevention Plan 10/04/2020  Transportation Screening Complete  PCP or Specialist Appt within 3-5 Days Not Complete  Not Complete comments going to Hospice home  [place] or [place] Complete  Social Work Consult for [place] Planning/Counseling Complete  Palliative  Care Screening Complete  Medication Review Press photographer) Complete  Some recent data might be hidden

## 2020-10-04 NOTE — Progress Notes (Signed)
RN to room to check on Pt. Pt asleep and resting comfortably with wife at bedside.

## 2020-10-04 NOTE — Progress Notes (Signed)
19:45  RN rounded on patient and pt was asleep.  Wife was at bedside eating dinner.  Wife expressed concern for pain during transfer of facilities.  RN suggested medicating patient before transfer to help with comfort.  Wife wishes to remain at bedside until [place] arrives and RN was in agreement with this plan.  Will continue to offer family/ patient support.

## 2020-10-04 NOTE — Discharge Summary (Signed)
Physician Discharge Summary  Victor Wallace:[id number] DOB: 06-Nov-1924 DOA: 09/29/2020  PCP: Haywood Pao, MD  Admit date: 09/29/2020 Discharge date: 10/04/2020  Admitted From: home Disposition:  Residential hospice  Recommendations for Outpatient Follow-up:  1. Patient to be discharged to residential hospice for end of life care  Discharge Condition: stable CODE STATUS:DNR, comfort care Diet recommendation: regular diet for comfort  Brief/Interim Summary: 87 y/o male admitted to the hospital after a fall, and found to have an acute sacral fracture and acute T6 compression fracture with large epidural hematoma. Seen by neurosurgery and nonoperative management recommended. Pain is currently uncontrolled. Palliative care consulted to help address goals of care.  Discharge Diagnoses:  Principal Problem:   Thoracic compression fracture ([place]) Active Problems:   Hyperlipidemia   Type 2 diabetes mellitus with renal manifestations, controlled (HCC)   Hypotension   Sacral fracture ([place])   Hospice care patient  Acute T6 compression fracture with large epidural hematoma -secondary to unwitnessed fall -seen by neurosurgery -non surgical management recommended with pain control and TLSO brace -family in agreement to pursue non operative approach  Acute minimally displaced right sacral fracture.  -secondary to fall -images reviewed with Dr. Erlinda Hong, ortho, who recommended non operative management -WBAT  Pain management -appreciate palliative assistance -started on fentanyl patch -use morphine for breakthrough  Hypotension, mild lactic acidosis -no clear source of infection -WBC count continues to rise -possibly stress response -blood cultures haven't shown any growth as of yet -he was on empiric antibiotics -since he is afebrile and does not have a clear source of infection, antibiotics discontinued  AKI on CKD stage 3b -creatinine at 1.8 on admission -overall  improved to 1.3 with IV fluids  Paroxysmal A fib -He was receiving metoprolol for rate control -hold anticoagulation with epidural hematoma -per wife, patient has not been taking any anticoagulation in the last 3 months  DM2, insulin dependent -stopped insulin and CBG checks since transition to comfort care -A1c 8.4  Pulmonary nodule -noted on CT, can consider follow up if desired  Bilateral subdural hygromas -likely chronic subdural hematomas -no midline shift  Goals of care -patient's wife had reported that patient had not been eating and drinking well for quite some time now -Palliative care consulted to help with discussions around goals of care and plans moving forward -patient appears to be a reasonable candidate for hospice -After meeting with palliative care, patient's wife has expressed interest in residential hospice in [place].  -Patient will be discharged to hospice facility today  Discharge Instructions  Discharge Instructions    Diet - low sodium heart healthy   Complete by: As directed    Increase activity slowly   Complete by: As directed      Allergies as of 10/04/2020      Reactions   Xiidra [lifitegrast] Other (See Comments)   Made the eyes burn      Medication List    STOP taking these medications   Accu-Chek FastClix Lancets Misc   albuterol 108 (90 Base) MCG/ACT inhaler Commonly known as: VENTOLIN HFA   aspirin EC 81 MG tablet   atorvastatin 40 MG tablet Commonly known as: LIPITOR   B-12 1000 MCG Tabs   B-D UF III MINI PEN NEEDLES 31G X 5 MM Misc Generic drug: Insulin Pen Needle   Calcium 600+D 600-800 MG-UNIT Tabs Generic drug: Calcium Carb-Cholecalciferol   cycloSPORINE 0.05 % ophthalmic emulsion Commonly known as: RESTASIS   fish oil-omega-3 fatty acids 1000 MG capsule  FreeStyle Libre 14 Day Sensor Misc   FREESTYLE LITE test strip Generic drug: glucose blood   glucose blood test strip   Insulin Pen Needle 31G X 5  MM Misc   isosorbide mononitrate 30 MG 24 hr tablet Commonly known as: IMDUR   Levemir FlexTouch 100 UNIT/ML FlexPen Generic drug: insulin detemir   linagliptin 5 MG Tabs tablet Commonly known as: TRADJENTA   metoprolol succinate 25 MG 24 hr tablet Commonly known as: TOPROL-XL   metoprolol tartrate 25 MG tablet Commonly known as: LOPRESSOR   multivitamin with minerals tablet   nitroGLYCERIN 0.4 MG SL tablet Commonly known as: NITROSTAT   NovoLOG FlexPen 100 UNIT/ML FlexPen Generic drug: insulin aspart   omeprazole 20 MG capsule Commonly known as: PRILOSEC   pyridOXINE 100 MG tablet Commonly known as: VITAMIN B-6   timolol 0.5 % ophthalmic solution Commonly known as: TIMOPTIC     TAKE these medications   baclofen 1 mg/mL Soln oral solution Commonly known as: OZOBAX Take 10 mLs (10 mg total) by mouth 3 (three) times daily.   Docusate Sodium 100 MG capsule Take $RemoveBefo'100mg'KiCbDPigkdx$  by mouth daily   fentaNYL 12 MCG/HR Commonly known as: [place] 1 patch onto the skin every 3 (three) days. Start taking on: October 05, 2020   GenTeal Severe 0.3 % Gel ophthalmic ointment Generic drug: hypromellose Place 1 application into both eyes as needed for dry eyes.   morphine CONCENTRATE 10 MG/0.5ML Soln concentrated solution Take 0.5 mLs (10 mg total) by mouth every 2 (two) hours as needed for severe pain or shortness of breath.       Follow-up Information    Leandrew Koyanagi, MD Follow up in 4 week(s).   Specialty: Orthopedic Surgery Contact information: [place] [address] [phone]              Allergies  Allergen Reactions  . Shirley Friar [Lifitegrast] Other (See Comments)    Made the eyes burn    Consultations:  Neurosurgery  Palliative care   Procedures/Studies: DG Chest 1 View  Result Date: 09/29/2020 CLINICAL DATA:  Loss of balance, fall, hip pain EXAM: CHEST  1 VIEW COMPARISON:  08/10/2019 FINDINGS: The heart size and mediastinal  contours are within normal limits. Both lungs are clear. The visualized skeletal structures are unremarkable. IMPRESSION: No active disease. Electronically Signed   By: Randa Ngo M.D.   On: 09/29/2020 15:20   CT Head Wo Contrast  Result Date: 09/29/2020 CLINICAL DATA:  Loss of balance, fell, hip pain EXAM: CT HEAD WITHOUT CONTRAST TECHNIQUE: Contiguous axial images were obtained from the base of the skull through the vertex without intravenous contrast. COMPARISON:  01/31/2012 FINDINGS: Brain: Hypodensities within the periventricular white matter are most consistent with chronic small vessel ischemic changes. No acute infarct or hemorrhage. Lateral ventricles and remaining midline structures are unremarkable. There are no acute extra-axial fluid collections. CSF attenuation fluid along the cerebral convexities measure up to 0.7 cm on the right and 0.9 cm on the left, consistent with subdural hygromas or chronic subdural hematomas. These are new since prior study. No mass effect. Vascular: No hyperdense vessel or unexpected calcification. Skull: Normal. Negative for fracture or focal lesion. Sinuses/Orbits: Mild polypoid mucosal thickening within the bilateral maxillary sinuses unchanged. Remaining sinuses are clear. Other: None. IMPRESSION: 1. Extra-axial CSF attenuation collections along the bilateral convexities, consistent with subdural hygromas or chronic subdural hematoma. No mass effect. 2. No acute intracranial hemorrhage. 3. Chronic small vessel ischemic changes within  the white matter. No acute infarct. Electronically Signed   By: Randa Ngo M.D.   On: 09/29/2020 18:38   CT Cervical Spine Wo Contrast  Result Date: 09/29/2020 CLINICAL DATA:  [place], loss of balance EXAM: CT CERVICAL SPINE WITHOUT CONTRAST TECHNIQUE: Multidetector CT imaging of the cervical spine was performed without intravenous contrast. Multiplanar CT image reconstructions were also generated. COMPARISON:  None. FINDINGS:  Alignment: Right convex scoliosis centered at C5. Otherwise alignment is anatomic. Skull base and vertebrae: No acute fracture. No primary bone lesion or focal pathologic process. Soft tissues and spinal canal: No prevertebral fluid or swelling. No visible canal hematoma. 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 Prominent spondylosis at C3-4, C5-6, and C6-7. Mild diffuse bilateral facet hypertrophy. Neural foraminal narrowing most pronounced at C3-4 and C5-6. Upper chest: Airway is patent.  Lung apices are clear. Other: Reconstructed images demonstrate no additional findings. IMPRESSION: 1. No acute cervical spine fracture. 2. Extensive multilevel cervical spondylosis and facet hypertrophy. Electronically Signed   By: Randa Ngo M.D.   On: 09/29/2020 18:40   CT Thoracic Spine Wo Contrast  Result Date: 09/29/2020 CLINICAL DATA:  Ground level fall at home using walker in the bathroom. Lost balance leading to fall. Back pain. EXAM: CT THORACIC SPINE WITHOUT CONTRAST TECHNIQUE: Multidetector CT images of the thoracic were obtained using the standard protocol without intravenous contrast. COMPARISON:  No prior thoracic spine imaging. Chest radiograph 05/16/2017 reviewed FINDINGS: Alignment: Exaggerated upper thoracic kyphosis. Vertebrae: Acute T6 compression fracture. 40% loss of height anteriorly. There is posterior cortex involvement with cortical buckling of bony retropulsion of 2 mm. There is a nondisplaced fracture component that involves the spinous process of T6. No convincing pedicle or laminar involvement. No transverse process involvement. No other acute fracture. Occasional Schmorl's nodes as well as multilevel anterior spurring. Paraspinal and other soft tissues: 4 mm left upper lobe pulmonary nodule, series 3, image 40. Hypoventilatory changes in the dependent lungs. Calcified right hilar lymph nodes consistent with prior granulomatous disease. No acute  fracture of included ribs. Disc levels: There is mild bony canal narrowing at T6 related to mild bony retropulsion. No other canal narrowing. Minor disc space narrowing in the lower thoracic spine with anterior spurring. IMPRESSION: 1. Acute T6 compression fracture with 40% loss of height anteriorly and 2 mm bony retropulsion. Mild bony canal narrowing at T6 related to bony retropulsion. Nondisplaced fracture extends through the spinous process of T6. 2. No other acute fracture of the thoracic spine. 3. Incidental note of a left upper lobe pulmonary nodule measuring 57mm. No prior exams available for comparison. Follow-up recommendation for a nodule of this size as follows, giving consideration to patient's advanced age: No follow-up needed if patient is low-risk. Non-contrast chest CT can be considered in 12 months if patient is high-risk. This recommendation follows the consensus statement: Guidelines for Management of Incidental Pulmonary Nodules Detected on CT Images: From the Fleischner Society 2017; Radiology 2017; 284:228-243. Electronically Signed   By: Keith Rake M.D.   On: 09/29/2020 18:43   MR THORACIC SPINE WO CONTRAST  Result Date: 09/29/2020 CLINICAL DATA:  Back pain EXAM: MRI THORACIC SPINE WITHOUT CONTRAST TECHNIQUE: Multiplanar, multisequence MR imaging of the thoracic spine was performed. No intravenous contrast was administered. COMPARISON:  None. FINDINGS: Truncated examination as patient refused postcontrast imaging. Seven series provided. Alignment: Normal Vertebrae: There is an acute compression fracture of T6 with approximately 75% anterior height loss. There is  retropulsion of 3 mm. Cord: Normal signal and morphology. There is a ventral epidural collection extending from T3-T12. Paraspinal and other soft tissues: Negative. Disc levels: Mild spinal canal narrowing at the T6 level due to a slight retropulsion of T6. IMPRESSION: 1. Truncated examination as patient refused postcontrast  imaging. 2. Acute compression fracture of T6 with approximately 75% anterior height loss and 3 mm retropulsion. 3. Ventral epidural collection extending from T3-T12, favored to be an epidural hematoma, particularly as the patient is on anticoagulation therapy. This narrows the thecal sac and exerts mass effect on the spinal cord. Critical Value/emergent results were called by telephone at the time of interpretation on 09/29/2020 at 11:12 pm to provider [facility] , who verbally acknowledged these results. Electronically Signed   By: Ulyses Jarred M.D.   On: 09/29/2020 23:12   CT L-SPINE NO CHARGE  Result Date: 09/29/2020 CLINICAL DATA:  Ground level fall at home all using walker in the bathroom. Lost balance leading to fall. Low back pain. EXAM: CT LUMBAR SPINE WITHOUT CONTRAST TECHNIQUE: Multidetector CT imaging of the lumbar spine was performed without intravenous contrast administration. Multiplanar CT image reconstructions were also generated. COMPARISON:  Reformats from abdominopelvic CT 02/21/2015 FINDINGS: Segmentation: 5 lumbar type vertebrae. Alignment: Normal. Vertebrae: Mild L1 superior endplate compression fracture is chronic. There is a prominent Schmorl's node involving L4 that is chronic. Undulation of inferior L5 endplate is chronic. There is no evidence of acute lumbar fracture. There is an acute fractures through right aspect of the sacrum extending through the sacral foramen, zone 2 fracture. Paraspinal and other soft tissues: Assistant concurrent abdominopelvic CT, reported separately. Disc levels: Minor L5-S1 and L1-L2 disc space narrowing. There is facet hypertrophy at L3-L4 and L5-S1. IMPRESSION: 1. Acute minimally displaced 7 2 right sacral fracture extending through the sacral foramen. 2. No evidence of acute lumbar fracture. 3. Chronic mild L1 compression fracture, unchanged from 2016. Electronically Signed   By: Keith Rake M.D.   On: 09/29/2020 18:49   CT Renal Stone  Study  Result Date: 09/29/2020 CLINICAL DATA:  Flank pain.  Kidney stone suspected. EXAM: CT ABDOMEN AND PELVIS WITHOUT CONTRAST TECHNIQUE: Multidetector CT imaging of the abdomen and pelvis was performed following the standard protocol without IV contrast. COMPARISON:  February 21, 2015 FINDINGS: Lower chest: Mild dependent airspace consolidation versus atelectasis, right greater than left. Hepatobiliary: No focal liver abnormality is seen. No gallstones, gallbladder wall thickening, or biliary dilatation. Pancreas: Unremarkable. No pancreatic ductal dilatation or surrounding inflammatory changes. Spleen: Scattered splenic granulomata 1.6 cm circumscribed hypoattenuated lesion in the anterior aspect of the spleen. Adrenals/Urinary Tract: Normal adrenal glands. No evidence of hydronephrosis or obstructive uropathy. 3 mm nonobstructive calculus in the lower pole of the right kidney. 2.7 x 2.1 cm solid mass within the mid polar medial region of the right kidney. 5.3 cm rim calcified exophytic left renal cyst off of the mid polar region of the kidney. Stomach/Bowel: Stomach is within normal limits. No evidence of bowel wall thickening, distention, or inflammatory changes. Vascular/Lymphatic: Aortic atherosclerosis. No enlarged abdominal or pelvic lymph nodes. Reproductive: Nonspecific coarse calcifications of the prostate gland. Other: No abdominal wall hernia or abnormality. No abdominopelvic ascites. Musculoskeletal: Right total hip arthroplasty. No evidence of acute fractures. IMPRESSION: 1. 2.7 cm indeterminate density mass within the mid polar medial region of the right kidney, not significantly changed from 2016 2. No evidence of obstructive uropathy. 3 mm nonobstructive right lower pole renal calculus. 3. 5.3 cm rim calcified exophytic  left renal cyst, stable from 2016. 4. 1.6 cm circumscribed hypoattenuated lesion in the anterior aspect of the spleen, nonspecific. 5. Mild dependent airspace consolidation versus  atelectasis, right greater than left. Aortic Atherosclerosis (ICD10-I70.0). Electronically Signed   By: Fidela Salisbury M.D.   On: 09/29/2020 18:49   DG Hip Unilat With Pelvis 2-3 Views Right  Result Date: 09/29/2020 CLINICAL DATA:  [place], right hip pain EXAM: DG HIP (WITH OR WITHOUT PELVIS) 2-3V RIGHT COMPARISON:  None. FINDINGS: Frontal view of the pelvis as well as frontal and cross-table lateral views of the right hip are obtained. The distal margin of the femoral component of the right hip arthroplasty is excluded by collimation on the lateral view. Bones are diffusely osteopenic. There is a right hip hemiarthroplasty, with long stem femoral component and cerclage wires. Alignment is anatomic. There are no acute displaced fractures. Heterotopic ossification surrounds the right hip arthroplasty. Mild left hip osteoarthritis. Sacroiliac joints are normal. IMPRESSION: 1. No acute displaced pelvic fracture. 2. Unremarkable right hip arthroplasty. Portions of the distal margin of the femoral component of the arthroplasty are excluded by collimation on the lateral view. Electronically Signed   By: Randa Ngo M.D.   On: 09/29/2020 15:19       Subjective: Sleeping, seems comfortable  Discharge Exam: Vitals:   10/03/20 2000 10/03/20 2155 10/04/20 0355 10/04/20 0808  BP: (!) 170/88 (!) 150/101 (!) 166/97 (!) 154/75  Pulse:  86  79  Resp:    20  Temp: 98.8 F (37.1 C)  98 F (36.7 C)   TempSrc: Oral  Oral   SpO2: 95%   99%  Weight:      Height:        General: sleeping Cardiovascular: RRR, S1/S2 +, no rubs, no gallops Respiratory: CTA bilaterally, no wheezing, no rhonchi     The results of significant diagnostics from this hospitalization (including imaging, microbiology, ancillary and laboratory) are listed below for reference.     Microbiology: Recent Results (from the past 240 hour(s))  Culture, blood (routine x 2)     Status: None   Collection Time: 09/29/20  3:59 PM    Specimen: BLOOD  Result Value Ref Range Status   Specimen Description   Final    BLOOD RIGHT WRIST Performed at [facility] [address], [place], [place] [address]    Special Requests   Final    BOTTLES DRAWN AEROBIC AND ANAEROBIC Blood Culture results may not be optimal due to an inadequate volume of blood received in culture bottles Performed at [facility] [address] [place], [place] [address]    Culture   Final    NO GROWTH 5 DAYS Performed at [facility], [place] [address] [place], [place] [address]    Report Status 10/04/2020 FINAL  Final  Culture, blood (routine x 2)     Status: None   Collection Time: 09/29/20  4:09 PM   Specimen: BLOOD  Result Value Ref Range Status   Specimen Description   Final    BLOOD RIGHT ANTECUBITAL Performed at [facility] [address] [place], [place] [address]    Special Requests   Final    BOTTLES DRAWN AEROBIC AND ANAEROBIC Blood Culture results may not be optimal due to an inadequate volume of blood received in culture bottles Performed at [facility] [address] [place], [place] [address]    Culture   Final  NO GROWTH 5 DAYS Performed at [facility], [place] [address], [place], [place] [address]    Report Status 10/04/2020 FINAL  Final  Resp Panel by RT-PCR (Flu A&B, Covid) Nasopharyngeal Swab     Status: None   Collection Time: 09/29/20  8:46 PM   Specimen: Nasopharyngeal Swab; Nasopharyngeal(NP) swabs in vial transport medium  Result Value Ref Range Status   SARS Coronavirus 2 by RT PCR NEGATIVE NEGATIVE Final    Comment: (NOTE) SARS-CoV-2 target nucleic acids are NOT DETECTED.  The SARS-CoV-2 RNA is generally detectable in upper respiratory specimens during the acute phase of infection. The lowest concentration of SARS-CoV-2 viral copies this assay can detect is 138 copies/mL. A negative result does not preclude  SARS-Cov-2 infection and should not be used as the sole basis for treatment or other patient management decisions. A negative result may occur with  improper specimen collection/handling, submission of specimen other than nasopharyngeal swab, presence of viral mutation(s) within the areas targeted by this assay, and inadequate number of viral copies(<138 copies/mL). A negative result must be combined with clinical observations, patient history, and epidemiological information. The expected result is Negative.  Fact Sheet for Patients:  EntrepreneurPulse.com.au  Fact Sheet for Healthcare Providers:  IncredibleEmployment.be  This test is no t yet approved or cleared by the [place] FDA and  has been authorized for detection and/or diagnosis of SARS-CoV-2 by FDA under an Emergency Use Authorization (EUA). This EUA will remain  in effect (meaning this test can be used) for the duration of the COVID-19 declaration under Section 564(b)(1) of the Act, 21 U.S.C.section 360bbb-3(b)(1), unless the authorization is terminated  or revoked sooner.       Influenza A by PCR NEGATIVE NEGATIVE Final   Influenza B by PCR NEGATIVE NEGATIVE Final    Comment: (NOTE) The Xpert Xpress SARS-CoV-2/FLU/RSV plus assay is intended as an aid in the diagnosis of influenza from Nasopharyngeal swab specimens and should not be used as a sole basis for treatment. Nasal washings and aspirates are unacceptable for Xpert Xpress SARS-CoV-2/FLU/RSV testing.  Fact Sheet for Patients: EntrepreneurPulse.com.au  Fact Sheet for Healthcare Providers: IncredibleEmployment.be  This test is not yet approved or cleared by the [place] FDA and has been authorized for detection and/or diagnosis of SARS-CoV-2 by FDA under an Emergency Use Authorization (EUA). This EUA will remain in effect (meaning this test can be used) for the duration of  the COVID-19 declaration under Section 564(b)(1) of the Act, 21 U.S.C. section 360bbb-3(b)(1), unless the authorization is terminated or revoked.  Performed at [facility], [place] [address] [place], [place] [address]   MRSA PCR Screening     Status: None   Collection Time: 09/30/20  2:21 AM   Specimen: Nasal Mucosa; Nasopharyngeal  Result Value Ref Range Status   MRSA by PCR NEGATIVE NEGATIVE Final    Comment:        The GeneXpert MRSA Assay (FDA approved for NASAL specimens only), is one component of a comprehensive MRSA colonization surveillance program. It is not intended to diagnose MRSA infection nor to guide or monitor treatment for MRSA infections. Performed at [facility], [place] [address], [place], [place] [address]      Labs: BNP (last 3 results) No results for input(s): BNP in the last 8760 hours. Basic Metabolic Panel: Recent Labs  Lab 09/29/20 1417 09/30/20 0500 10/01/20 0429 10/02/20 0329  NA 140 140 137 139  K 4.1 4.9 4.3 4.1  CL 108 109 106 107  CO2 25 21* 22 26  GLUCOSE 165* 341* 295* 168*  BUN 28* 32* 32* 25*  CREATININE 1.53* 1.84* 1.49* 1.30*  CALCIUM 9.1 8.8* 8.7* 8.8*   Liver Function Tests: Recent Labs  Lab 10/01/20 0429  AST 57*  ALT 37  ALKPHOS 75  BILITOT 0.8  PROT 5.8*  ALBUMIN 2.8*   No results for input(s): LIPASE, AMYLASE in the last 168 hours. No results for input(s): AMMONIA in the last 168 hours. CBC: Recent Labs  Lab 09/29/20 1417 09/30/20 0521 10/01/20 0429 10/02/20 0329  WBC 16.4* 19.5* 21.3* 17.1*  NEUTROABS 13.6*  --   --   --   HGB 13.4 12.8* 12.7* 12.7*  HCT 42.5 40.0 39.3 39.3  MCV 99.3 97.3 96.1 97.0  PLT 159 127* 108* 69*   Cardiac Enzymes: No results for input(s): CKTOTAL, CKMB, CKMBINDEX, TROPONINI in the last 168 hours. BNP: Invalid input(s): POCBNP CBG: Recent Labs  Lab 10/02/20 1105 10/02/20 1625 10/02/20 2124 10/03/20 0413 10/03/20 0728  GLUCAP 129* 108* 123*  120* 140*   D-Dimer No results for input(s): DDIMER in the last 72 hours. Hgb A1c No results for input(s): HGBA1C in the last 72 hours. Lipid Profile No results for input(s): CHOL, HDL, LDLCALC, TRIG, CHOLHDL, LDLDIRECT in the last 72 hours. Thyroid function studies No results for input(s): TSH, T4TOTAL, T3FREE, THYROIDAB in the last 72 hours.  Invalid input(s): FREET3 Anemia work up No results for input(s): VITAMINB12, FOLATE, FERRITIN, TIBC, IRON, RETICCTPCT in the last 72 hours. Urinalysis    Component Value Date/Time   COLORURINE YELLOW 08/10/2019 2250   APPEARANCEUR CLOUDY (A) 08/10/2019 2250   LABSPEC 1.015 08/10/2019 2250   PHURINE 5.0 08/10/2019 2250   GLUCOSEU >=500 (A) 08/10/2019 2250   HGBUR MODERATE (A) 08/10/2019 2250   BILIRUBINUR NEGATIVE 08/10/2019 2250   KETONESUR 5 (A) 08/10/2019 2250   PROTEINUR 30 (A) 08/10/2019 2250   UROBILINOGEN 0.2 04/29/2015 0504   NITRITE NEGATIVE 08/10/2019 2250   LEUKOCYTESUR LARGE (A) 08/10/2019 2250   Sepsis Labs Invalid input(s): PROCALCITONIN,  WBC,  LACTICIDVEN Microbiology Recent Results (from the past 240 hour(s))  Culture, blood (routine x 2)     Status: None   Collection Time: 09/29/20  3:59 PM   Specimen: BLOOD  Result Value Ref Range Status   Specimen Description   Final    BLOOD RIGHT WRIST Performed at [facility], [place] [address] [place], [place] [address]    Special Requests   Final    BOTTLES DRAWN AEROBIC AND ANAEROBIC Blood Culture results may not be optimal due to an inadequate volume of blood received in culture bottles Performed at [place] [address] [place], [place] [address]    Culture   Final    NO GROWTH 5 DAYS Performed at [facility], [place] [address] [place], [place] [address]    Report Status 10/04/2020 FINAL  Final  Culture, blood (routine x 2)     Status: None   Collection Time: 09/29/20  4:09 PM   Specimen: BLOOD  Result Value Ref  Range Status   Specimen Description   Final    BLOOD RIGHT ANTECUBITAL Performed at [place] [address], [place], [place] [address]    Special Requests   Final    BOTTLES DRAWN AEROBIC AND ANAEROBIC Blood Culture results may not be optimal due to an inadequate volume of blood received in culture bottles  Performed at [facility], [place] [address], [place], [place] [address]    Culture   Final    NO GROWTH 5 DAYS Performed at [facility], [place] [address] [place], [place] [address]    Report Status 10/04/2020 FINAL  Final  Resp Panel by RT-PCR (Flu A&B, Covid) Nasopharyngeal Swab     Status: None   Collection Time: 09/29/20  8:46 PM   Specimen: Nasopharyngeal Swab; Nasopharyngeal(NP) swabs in vial transport medium  Result Value Ref Range Status   SARS Coronavirus 2 by RT PCR NEGATIVE NEGATIVE Final    Comment: (NOTE) SARS-CoV-2 target nucleic acids are NOT DETECTED.  The SARS-CoV-2 RNA is generally detectable in upper respiratory specimens during the acute phase of infection. The lowest concentration of SARS-CoV-2 viral copies this assay can detect is 138 copies/mL. A negative result does not preclude SARS-Cov-2 infection and should not be used as the sole basis for treatment or other patient management decisions. A negative result may occur with  improper specimen collection/handling, submission of specimen other than nasopharyngeal swab, presence of viral mutation(s) within the areas targeted by this assay, and inadequate number of viral copies(<138 copies/mL). A negative result must be combined with clinical observations, patient history, and epidemiological information. The expected result is Negative.  Fact Sheet for Patients:  EntrepreneurPulse.com.au  Fact Sheet for Healthcare Providers:  IncredibleEmployment.be  This test is no t yet approved or cleared by the [place] FDA and   has been authorized for detection and/or diagnosis of SARS-CoV-2 by FDA under an Emergency Use Authorization (EUA). This EUA will remain  in effect (meaning this test can be used) for the duration of the COVID-19 declaration under Section 564(b)(1) of the Act, 21 U.S.C.section 360bbb-3(b)(1), unless the authorization is terminated  or revoked sooner.       Influenza A by PCR NEGATIVE NEGATIVE Final   Influenza B by PCR NEGATIVE NEGATIVE Final    Comment: (NOTE) The Xpert Xpress SARS-CoV-2/FLU/RSV plus assay is intended as an aid in the diagnosis of influenza from Nasopharyngeal swab specimens and should not be used as a sole basis for treatment. Nasal washings and aspirates are unacceptable for Xpert Xpress SARS-CoV-2/FLU/RSV testing.  Fact Sheet for Patients: EntrepreneurPulse.com.au  Fact Sheet for Healthcare Providers: IncredibleEmployment.be  This test is not yet approved or cleared by the [place] FDA and has been authorized for detection and/or diagnosis of SARS-CoV-2 by FDA under an Emergency Use Authorization (EUA). This EUA will remain in effect (meaning this test can be used) for the duration of the COVID-19 declaration under Section 564(b)(1) of the Act, 21 U.S.C. section 360bbb-3(b)(1), unless the authorization is terminated or revoked.  Performed at [facility], [place] [address], [place], [place] [address]   MRSA PCR Screening     Status: None   Collection Time: 09/30/20  2:21 AM   Specimen: Nasal Mucosa; Nasopharyngeal  Result Value Ref Range Status   MRSA by PCR NEGATIVE NEGATIVE Final    Comment:        The GeneXpert MRSA Assay (FDA approved for NASAL specimens only), is one component of a comprehensive MRSA colonization surveillance program. It is not intended to diagnose MRSA infection nor to guide or monitor treatment for MRSA infections. Performed at [facility], [place] [address], [place], [place] [address]      Time coordinating discharge: 1mins  SIGNED:   Kathie Dike, MD  [facility] 10/04/2020, 10:44 AM   If  7PM-7AM, please contact night-coverage [email or web address]

## 2020-10-04 NOTE — Progress Notes (Signed)
RN to assess Pt. Pt resting comfortably in bed with wife at bedside. VS taken and stable. Pt in no distress.

## 2020-10-04 NOTE — Progress Notes (Signed)
Pt resting comfortably, Pt's wife at bedside. [facility] called for ETA. PTAR stated Pt was 6th on the list.

## 2020-10-30 DEATH — deceased

## 2020-11-08 ENCOUNTER — Ambulatory Visit: Payer: TRICARE For Life (TFL) | Admitting: Cardiology

## 2020-12-30 ENCOUNTER — Ambulatory Visit: Payer: Medicare Other | Admitting: Podiatry

## 2021-04-03 ENCOUNTER — Encounter (INDEPENDENT_AMBULATORY_CARE_PROVIDER_SITE_OTHER): Payer: Medicare Other | Admitting: Ophthalmology
